# Patient Record
Sex: Male | Born: 1955 | Race: White | Hispanic: No | Marital: Married | Smoking: Former smoker
Health system: Southern US, Community
[De-identification: ages and names within clinical notes are randomized; demographics above are authoritative.]

## PROBLEM LIST (undated history)

## (undated) DIAGNOSIS — E669 Obesity, unspecified: Secondary | ICD-10-CM

## (undated) DIAGNOSIS — I10 Essential (primary) hypertension: Secondary | ICD-10-CM

## (undated) DIAGNOSIS — E785 Hyperlipidemia, unspecified: Secondary | ICD-10-CM

## (undated) DIAGNOSIS — J40 Bronchitis, not specified as acute or chronic: Secondary | ICD-10-CM

## (undated) DIAGNOSIS — F419 Anxiety disorder, unspecified: Secondary | ICD-10-CM

## (undated) DIAGNOSIS — Z5309 Procedure and treatment not carried out because of other contraindication: Secondary | ICD-10-CM

## (undated) DIAGNOSIS — C8590 Non-Hodgkin lymphoma, unspecified, unspecified site: Secondary | ICD-10-CM

## (undated) DIAGNOSIS — Z9889 Other specified postprocedural states: Secondary | ICD-10-CM

## (undated) DIAGNOSIS — I318 Other specified diseases of pericardium: Secondary | ICD-10-CM

## (undated) DIAGNOSIS — J329 Chronic sinusitis, unspecified: Secondary | ICD-10-CM

## (undated) DIAGNOSIS — I509 Heart failure, unspecified: Secondary | ICD-10-CM

## (undated) HISTORY — PX: TUMOR REMOVAL: SHX12

## (undated) HISTORY — DX: Non-Hodgkin lymphoma, unspecified, unspecified site: C85.90

## (undated) HISTORY — DX: Chronic sinusitis, unspecified: J32.9

## (undated) HISTORY — DX: Other specified postprocedural states: Z98.890

## (undated) HISTORY — DX: Heart failure, unspecified: I50.9

## (undated) HISTORY — DX: Essential (primary) hypertension: I10

## (undated) HISTORY — PX: HEMORRHOID SURGERY: SHX153

## (undated) HISTORY — DX: Bronchitis, not specified as acute or chronic: J40

## (undated) HISTORY — DX: Other specified diseases of pericardium: I31.8

## (undated) HISTORY — DX: Procedure and treatment not carried out because of other contraindication: Z53.09

## (undated) HISTORY — DX: Anxiety disorder, unspecified: F41.9

## (undated) HISTORY — DX: Obesity, unspecified: E66.9

## (undated) HISTORY — DX: Hyperlipidemia, unspecified: E78.5

## (undated) HISTORY — PX: NASAL SINUS SURGERY: SHX719

## (undated) HISTORY — PX: PORTACATH PLACEMENT: SHX2246

---

## 2000-07-24 ENCOUNTER — Emergency Department (HOSPITAL_COMMUNITY): Admission: EM | Admit: 2000-07-24 | Discharge: 2000-07-25 | Payer: Self-pay | Admitting: Emergency Medicine

## 2002-11-21 LAB — HM COLONOSCOPY: HM Colonoscopy: NORMAL

## 2008-07-21 HISTORY — PX: BACK SURGERY: SHX140

## 2008-07-28 ENCOUNTER — Ambulatory Visit (HOSPITAL_COMMUNITY): Admission: RE | Admit: 2008-07-28 | Discharge: 2008-07-28 | Payer: Self-pay | Admitting: Family Medicine

## 2008-07-29 ENCOUNTER — Ambulatory Visit: Payer: Self-pay | Admitting: Oncology

## 2008-07-29 ENCOUNTER — Encounter (INDEPENDENT_AMBULATORY_CARE_PROVIDER_SITE_OTHER): Payer: Self-pay | Admitting: Neurosurgery

## 2008-07-29 ENCOUNTER — Inpatient Hospital Stay (HOSPITAL_COMMUNITY): Admission: AD | Admit: 2008-07-29 | Discharge: 2008-08-02 | Payer: Self-pay | Admitting: Internal Medicine

## 2008-07-29 ENCOUNTER — Encounter: Payer: Self-pay | Admitting: Family Medicine

## 2008-07-30 ENCOUNTER — Encounter (INDEPENDENT_AMBULATORY_CARE_PROVIDER_SITE_OTHER): Payer: Self-pay | Admitting: Neurosurgery

## 2008-08-24 ENCOUNTER — Encounter (HOSPITAL_COMMUNITY): Admission: RE | Admit: 2008-08-24 | Discharge: 2008-09-23 | Payer: Self-pay | Admitting: Oncology

## 2008-08-24 ENCOUNTER — Ambulatory Visit (HOSPITAL_COMMUNITY): Payer: Self-pay | Admitting: Oncology

## 2008-08-29 ENCOUNTER — Ambulatory Visit (HOSPITAL_COMMUNITY): Admission: RE | Admit: 2008-08-29 | Discharge: 2008-08-29 | Payer: Self-pay | Admitting: Oncology

## 2008-08-30 ENCOUNTER — Encounter (HOSPITAL_COMMUNITY): Payer: Self-pay | Admitting: Oncology

## 2008-09-02 ENCOUNTER — Ambulatory Visit (HOSPITAL_COMMUNITY): Admission: RE | Admit: 2008-09-02 | Discharge: 2008-09-02 | Payer: Self-pay | Admitting: General Surgery

## 2008-09-22 ENCOUNTER — Encounter (HOSPITAL_COMMUNITY): Admission: RE | Admit: 2008-09-22 | Discharge: 2008-10-19 | Payer: Self-pay | Admitting: Oncology

## 2008-10-10 ENCOUNTER — Ambulatory Visit (HOSPITAL_COMMUNITY): Admission: RE | Admit: 2008-10-10 | Discharge: 2008-10-10 | Payer: Self-pay | Admitting: Oncology

## 2008-10-11 ENCOUNTER — Ambulatory Visit (HOSPITAL_COMMUNITY): Payer: Self-pay | Admitting: Oncology

## 2008-10-13 ENCOUNTER — Emergency Department (HOSPITAL_COMMUNITY): Admission: EM | Admit: 2008-10-13 | Discharge: 2008-10-13 | Payer: Self-pay | Admitting: Emergency Medicine

## 2008-10-17 ENCOUNTER — Ambulatory Visit (HOSPITAL_COMMUNITY): Admission: RE | Admit: 2008-10-17 | Discharge: 2008-10-17 | Payer: Self-pay | Admitting: Oncology

## 2008-10-17 ENCOUNTER — Encounter (HOSPITAL_COMMUNITY): Payer: Self-pay | Admitting: Oncology

## 2008-10-24 ENCOUNTER — Encounter (HOSPITAL_COMMUNITY): Admission: RE | Admit: 2008-10-24 | Discharge: 2008-11-23 | Payer: Self-pay | Admitting: Oncology

## 2008-11-07 ENCOUNTER — Ambulatory Visit (HOSPITAL_COMMUNITY): Admission: RE | Admit: 2008-11-07 | Discharge: 2008-11-07 | Payer: Self-pay | Admitting: Oncology

## 2008-11-28 ENCOUNTER — Ambulatory Visit (HOSPITAL_COMMUNITY): Admission: RE | Admit: 2008-11-28 | Discharge: 2008-11-28 | Payer: Self-pay | Admitting: Oncology

## 2008-11-29 ENCOUNTER — Ambulatory Visit (HOSPITAL_COMMUNITY): Payer: Self-pay | Admitting: Oncology

## 2008-11-29 ENCOUNTER — Encounter (HOSPITAL_COMMUNITY): Admission: RE | Admit: 2008-11-29 | Discharge: 2008-12-29 | Payer: Self-pay | Admitting: Oncology

## 2008-12-12 ENCOUNTER — Ambulatory Visit (HOSPITAL_COMMUNITY): Admission: RE | Admit: 2008-12-12 | Discharge: 2008-12-12 | Payer: Self-pay | Admitting: Oncology

## 2008-12-19 ENCOUNTER — Ambulatory Visit (HOSPITAL_COMMUNITY): Admission: RE | Admit: 2008-12-19 | Discharge: 2008-12-19 | Payer: Self-pay | Admitting: Oncology

## 2008-12-27 ENCOUNTER — Ambulatory Visit: Admission: RE | Admit: 2008-12-27 | Discharge: 2009-02-24 | Payer: Self-pay | Admitting: Radiation Oncology

## 2009-02-01 ENCOUNTER — Ambulatory Visit (HOSPITAL_COMMUNITY): Payer: Self-pay | Admitting: Oncology

## 2009-02-01 ENCOUNTER — Encounter (HOSPITAL_COMMUNITY): Admission: RE | Admit: 2009-02-01 | Discharge: 2009-03-03 | Payer: Self-pay | Admitting: Oncology

## 2009-04-27 ENCOUNTER — Ambulatory Visit (HOSPITAL_COMMUNITY): Payer: Self-pay | Admitting: Oncology

## 2009-04-27 ENCOUNTER — Encounter (HOSPITAL_COMMUNITY): Admission: RE | Admit: 2009-04-27 | Discharge: 2009-05-27 | Payer: Self-pay | Admitting: Oncology

## 2009-05-03 ENCOUNTER — Encounter: Admission: RE | Admit: 2009-05-03 | Discharge: 2009-05-03 | Payer: Self-pay | Admitting: Neurosurgery

## 2009-07-20 ENCOUNTER — Encounter (HOSPITAL_COMMUNITY): Admission: RE | Admit: 2009-07-20 | Discharge: 2009-07-20 | Payer: Self-pay | Admitting: Oncology

## 2009-07-20 ENCOUNTER — Ambulatory Visit (HOSPITAL_COMMUNITY): Payer: Self-pay | Admitting: Oncology

## 2009-07-21 HISTORY — PX: OTHER SURGICAL HISTORY: SHX169

## 2009-07-21 HISTORY — PX: LEFT HEART CATH: SHX5946

## 2009-08-08 ENCOUNTER — Inpatient Hospital Stay (HOSPITAL_COMMUNITY): Admission: EM | Admit: 2009-08-08 | Discharge: 2009-08-11 | Payer: Self-pay | Admitting: Emergency Medicine

## 2009-08-08 ENCOUNTER — Ambulatory Visit: Payer: Self-pay | Admitting: Cardiovascular Disease

## 2009-08-09 ENCOUNTER — Encounter: Payer: Self-pay | Admitting: Cardiology

## 2009-08-18 ENCOUNTER — Encounter: Payer: Self-pay | Admitting: Cardiology

## 2009-08-22 ENCOUNTER — Encounter: Payer: Self-pay | Admitting: Cardiology

## 2009-08-22 ENCOUNTER — Ambulatory Visit (HOSPITAL_COMMUNITY): Admission: RE | Admit: 2009-08-22 | Discharge: 2009-08-22 | Payer: Self-pay | Admitting: Family Medicine

## 2009-08-24 ENCOUNTER — Ambulatory Visit (HOSPITAL_COMMUNITY): Admission: RE | Admit: 2009-08-24 | Discharge: 2009-08-24 | Payer: Self-pay | Admitting: Oncology

## 2009-08-28 ENCOUNTER — Ambulatory Visit: Payer: Self-pay | Admitting: Cardiology

## 2009-08-28 ENCOUNTER — Ambulatory Visit: Payer: Self-pay | Admitting: Internal Medicine

## 2009-08-28 ENCOUNTER — Ambulatory Visit (HOSPITAL_COMMUNITY): Admission: RE | Admit: 2009-08-28 | Discharge: 2009-08-28 | Payer: Self-pay | Admitting: Internal Medicine

## 2009-08-28 ENCOUNTER — Encounter: Payer: Self-pay | Admitting: Internal Medicine

## 2009-08-28 DIAGNOSIS — I319 Disease of pericardium, unspecified: Secondary | ICD-10-CM | POA: Insufficient documentation

## 2009-08-28 DIAGNOSIS — I509 Heart failure, unspecified: Secondary | ICD-10-CM | POA: Insufficient documentation

## 2009-08-28 DIAGNOSIS — C8330 Diffuse large B-cell lymphoma, unspecified site: Secondary | ICD-10-CM

## 2009-08-29 ENCOUNTER — Telehealth: Payer: Self-pay | Admitting: Cardiology

## 2009-08-29 ENCOUNTER — Encounter: Payer: Self-pay | Admitting: Internal Medicine

## 2009-08-29 ENCOUNTER — Encounter (HOSPITAL_COMMUNITY): Admission: RE | Admit: 2009-08-29 | Discharge: 2009-09-28 | Payer: Self-pay | Admitting: Oncology

## 2009-08-29 ENCOUNTER — Encounter: Payer: Self-pay | Admitting: Cardiology

## 2009-09-01 ENCOUNTER — Ambulatory Visit: Payer: Self-pay | Admitting: Cardiology

## 2009-09-01 ENCOUNTER — Inpatient Hospital Stay (HOSPITAL_BASED_OUTPATIENT_CLINIC_OR_DEPARTMENT_OTHER): Admission: RE | Admit: 2009-09-01 | Discharge: 2009-09-01 | Payer: Self-pay | Admitting: Cardiology

## 2009-09-01 ENCOUNTER — Ambulatory Visit: Payer: Self-pay | Admitting: Thoracic Surgery (Cardiothoracic Vascular Surgery)

## 2009-09-06 ENCOUNTER — Encounter: Payer: Self-pay | Admitting: Thoracic Surgery (Cardiothoracic Vascular Surgery)

## 2009-09-06 ENCOUNTER — Inpatient Hospital Stay (HOSPITAL_COMMUNITY)
Admission: RE | Admit: 2009-09-06 | Discharge: 2009-09-09 | Payer: Self-pay | Admitting: Thoracic Surgery (Cardiothoracic Vascular Surgery)

## 2009-09-06 ENCOUNTER — Ambulatory Visit: Payer: Self-pay | Admitting: Cardiology

## 2009-09-13 ENCOUNTER — Ambulatory Visit: Payer: Self-pay | Admitting: Cardiology

## 2009-09-13 DIAGNOSIS — I311 Chronic constrictive pericarditis: Secondary | ICD-10-CM | POA: Insufficient documentation

## 2009-09-18 ENCOUNTER — Ambulatory Visit: Payer: Self-pay | Admitting: Thoracic Surgery (Cardiothoracic Vascular Surgery)

## 2009-09-26 ENCOUNTER — Ambulatory Visit: Payer: Self-pay | Admitting: Cardiology

## 2009-09-26 ENCOUNTER — Ambulatory Visit (HOSPITAL_COMMUNITY): Admission: RE | Admit: 2009-09-26 | Discharge: 2009-09-26 | Payer: Self-pay | Admitting: Cardiology

## 2009-09-26 ENCOUNTER — Ambulatory Visit: Payer: Self-pay

## 2009-09-26 ENCOUNTER — Ambulatory Visit: Payer: Self-pay | Admitting: Internal Medicine

## 2009-09-26 ENCOUNTER — Encounter: Payer: Self-pay | Admitting: Cardiology

## 2009-09-27 LAB — CONVERTED CEMR LAB
CO2: 31 meq/L (ref 19–32)
Chloride: 99 meq/L (ref 96–112)
Creatinine, Ser: 0.8 mg/dL (ref 0.4–1.5)
Potassium: 4 meq/L (ref 3.5–5.1)
Sodium: 139 meq/L (ref 135–145)

## 2009-09-28 ENCOUNTER — Encounter: Payer: Self-pay | Admitting: Cardiology

## 2009-09-28 ENCOUNTER — Ambulatory Visit: Payer: Self-pay | Admitting: Thoracic Surgery (Cardiothoracic Vascular Surgery)

## 2009-09-28 ENCOUNTER — Encounter
Admission: RE | Admit: 2009-09-28 | Discharge: 2009-09-28 | Payer: Self-pay | Admitting: Thoracic Surgery (Cardiothoracic Vascular Surgery)

## 2009-10-04 ENCOUNTER — Encounter: Payer: Self-pay | Admitting: Cardiology

## 2009-10-06 ENCOUNTER — Ambulatory Visit: Payer: Self-pay | Admitting: Cardiology

## 2009-10-10 ENCOUNTER — Ambulatory Visit (HOSPITAL_COMMUNITY): Payer: Self-pay | Admitting: Oncology

## 2009-10-16 ENCOUNTER — Encounter (HOSPITAL_COMMUNITY): Admission: RE | Admit: 2009-10-16 | Discharge: 2009-10-20 | Payer: Self-pay | Admitting: Cardiology

## 2009-10-23 ENCOUNTER — Encounter (HOSPITAL_COMMUNITY): Admission: RE | Admit: 2009-10-23 | Discharge: 2009-11-22 | Payer: Self-pay | Admitting: Cardiology

## 2009-11-13 ENCOUNTER — Encounter: Payer: Self-pay | Admitting: Cardiology

## 2009-11-23 ENCOUNTER — Encounter (HOSPITAL_COMMUNITY): Admission: RE | Admit: 2009-11-23 | Discharge: 2009-12-11 | Payer: Self-pay | Admitting: Cardiology

## 2009-11-28 ENCOUNTER — Encounter (HOSPITAL_COMMUNITY): Admission: RE | Admit: 2009-11-28 | Discharge: 2009-12-28 | Payer: Self-pay | Admitting: Oncology

## 2009-11-28 ENCOUNTER — Ambulatory Visit (HOSPITAL_COMMUNITY): Payer: Self-pay | Admitting: Oncology

## 2009-11-28 ENCOUNTER — Encounter: Payer: Self-pay | Admitting: Cardiology

## 2009-12-04 ENCOUNTER — Ambulatory Visit: Payer: Self-pay | Admitting: Thoracic Surgery (Cardiothoracic Vascular Surgery)

## 2009-12-21 ENCOUNTER — Ambulatory Visit: Payer: Self-pay | Admitting: Cardiology

## 2009-12-21 DIAGNOSIS — E785 Hyperlipidemia, unspecified: Secondary | ICD-10-CM | POA: Insufficient documentation

## 2010-01-05 ENCOUNTER — Encounter: Payer: Self-pay | Admitting: Cardiology

## 2010-01-09 ENCOUNTER — Encounter: Payer: Self-pay | Admitting: Cardiology

## 2010-01-11 ENCOUNTER — Telehealth: Payer: Self-pay | Admitting: Cardiology

## 2010-02-20 ENCOUNTER — Ambulatory Visit (HOSPITAL_COMMUNITY): Payer: Self-pay | Admitting: Oncology

## 2010-02-20 ENCOUNTER — Encounter (HOSPITAL_COMMUNITY): Admission: RE | Admit: 2010-02-20 | Discharge: 2010-03-22 | Payer: Self-pay | Admitting: Oncology

## 2010-03-07 ENCOUNTER — Ambulatory Visit (HOSPITAL_COMMUNITY): Admission: RE | Admit: 2010-03-07 | Discharge: 2010-03-07 | Payer: Self-pay | Admitting: Oncology

## 2010-03-16 ENCOUNTER — Ambulatory Visit (HOSPITAL_COMMUNITY): Admission: RE | Admit: 2010-03-16 | Discharge: 2010-03-16 | Payer: Self-pay | Admitting: Oncology

## 2010-03-27 ENCOUNTER — Telehealth: Payer: Self-pay | Admitting: Cardiology

## 2010-04-13 ENCOUNTER — Ambulatory Visit: Payer: Self-pay

## 2010-04-13 ENCOUNTER — Ambulatory Visit: Payer: Self-pay | Admitting: Cardiology

## 2010-04-13 ENCOUNTER — Encounter: Payer: Self-pay | Admitting: Cardiology

## 2010-04-13 ENCOUNTER — Ambulatory Visit (HOSPITAL_COMMUNITY): Admission: RE | Admit: 2010-04-13 | Discharge: 2010-04-13 | Payer: Self-pay | Admitting: Cardiology

## 2010-05-15 ENCOUNTER — Ambulatory Visit (HOSPITAL_COMMUNITY): Payer: Self-pay | Admitting: Oncology

## 2010-06-05 IMAGING — CT CT CHEST W/ CM
1 series · 15 of 31 positions shown, 19 images · IV contrast (Omnipaque 300)
Comparison: CT abdomen 07/28/2008 and MRI thoracic spine
07/29/2008.

CLINICAL DATA: T5 and T6 destructive mass.

CT CHEST WITH CONTRAST
TECHNIQUE: Multidetector CT imaging of the chest was performed
following the standard protocol during bolus administration of
intravenous contrast.
Contrast: 80 ml [id number].

[Series 2: chestroutine 5.0 b40f · axial · 0.79mm/px · z∈[-324,-60]mm · 15 of 59 slices shown, 19 images]
[im 3/59  mediastinal]
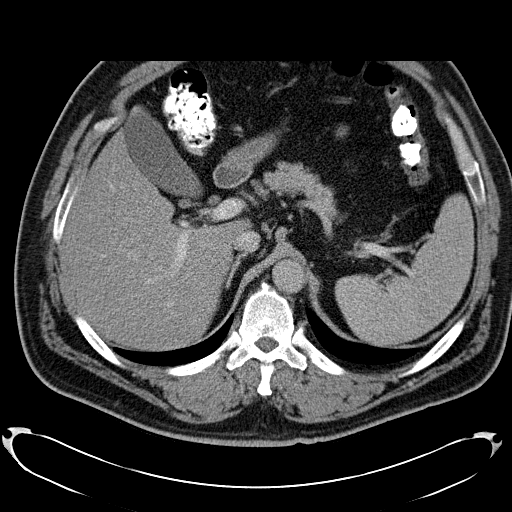
[im 3/59  lung]
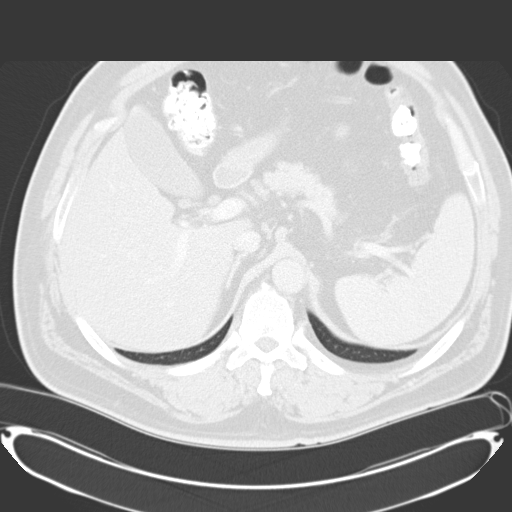
[im 7/59  lung]
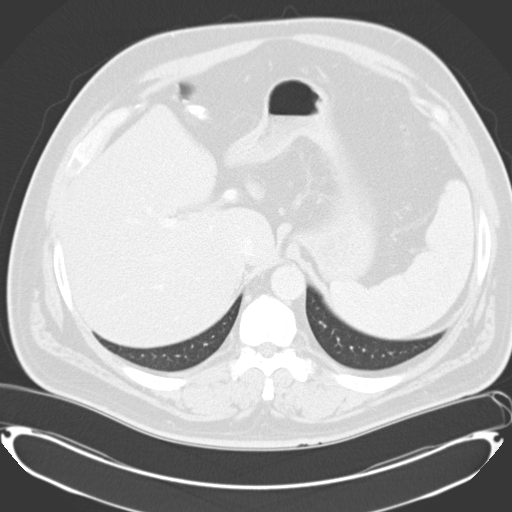
[im 11/59  lung]
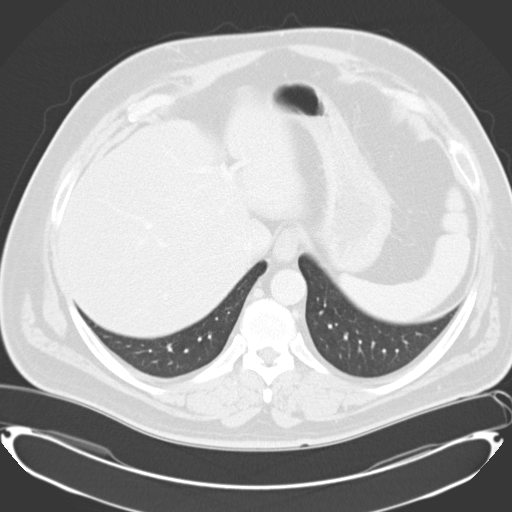
[im 13/59  lung]
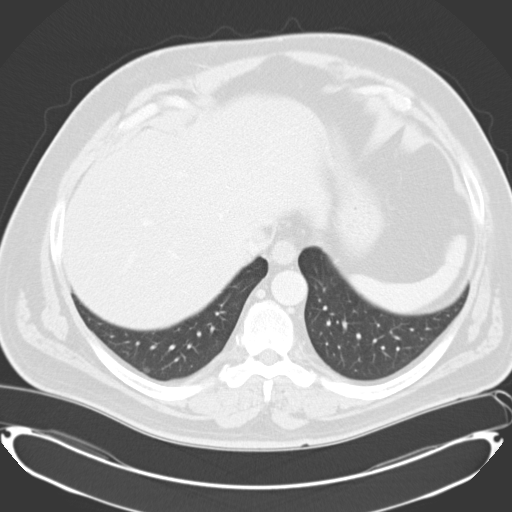
[im 18/59  mediastinal]
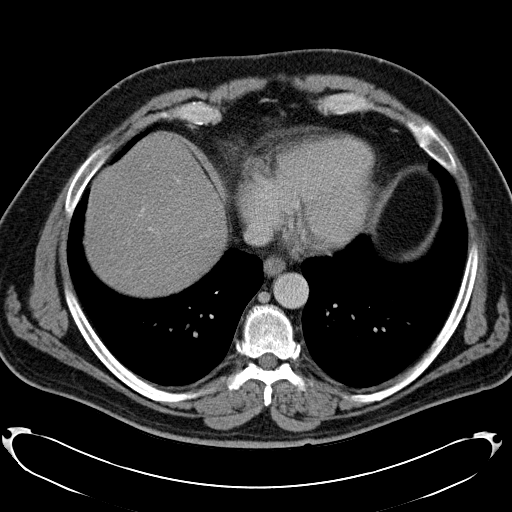
[im 18/59  lung]
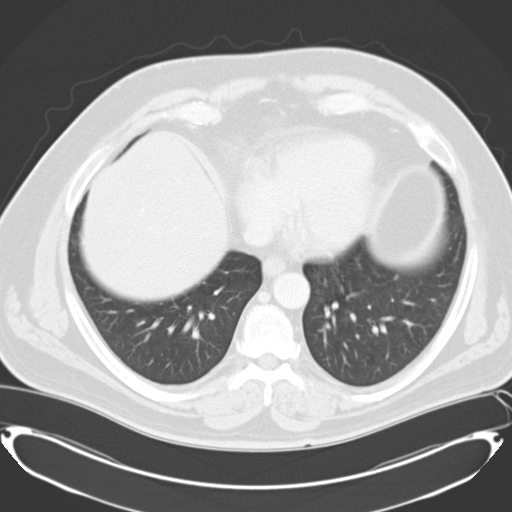
[im 22/59  lung]
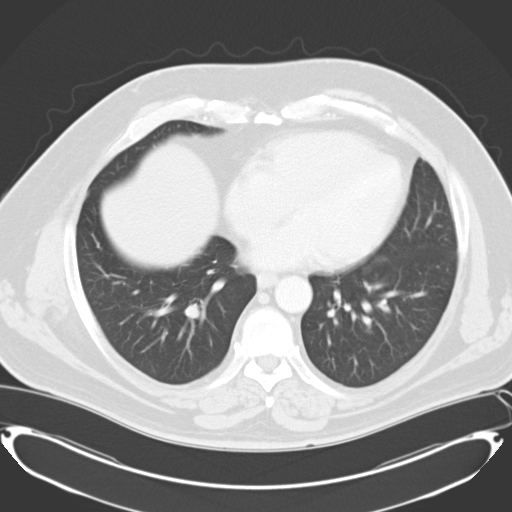
[im 26/59  lung]
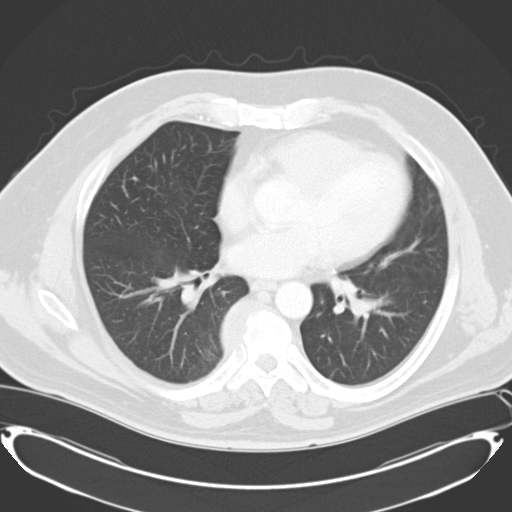
[im 31/59  lung]
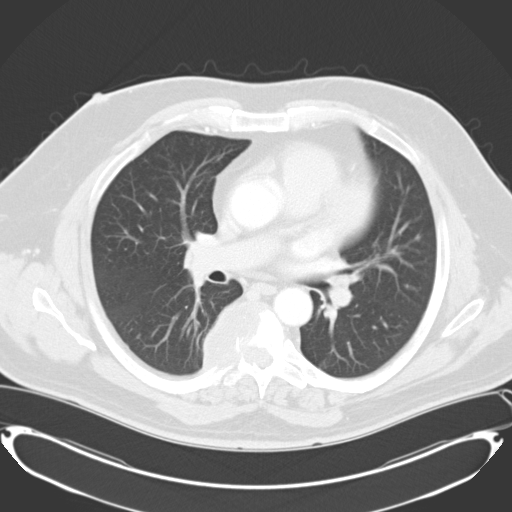
[im 33/59  mediastinal]
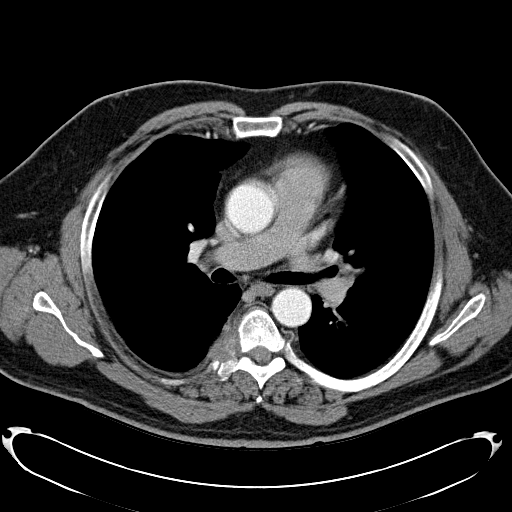
[im 33/59  lung]
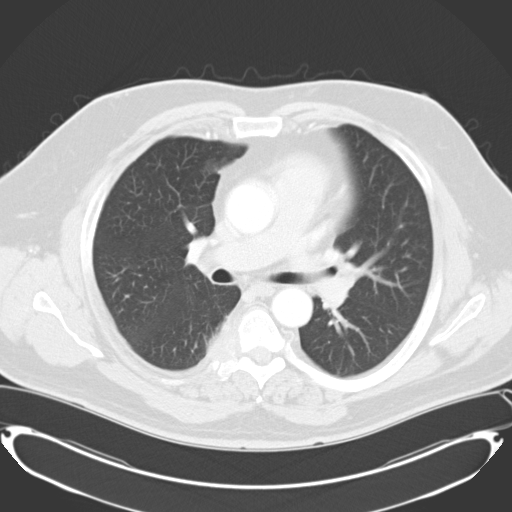
[im 37/59  lung]
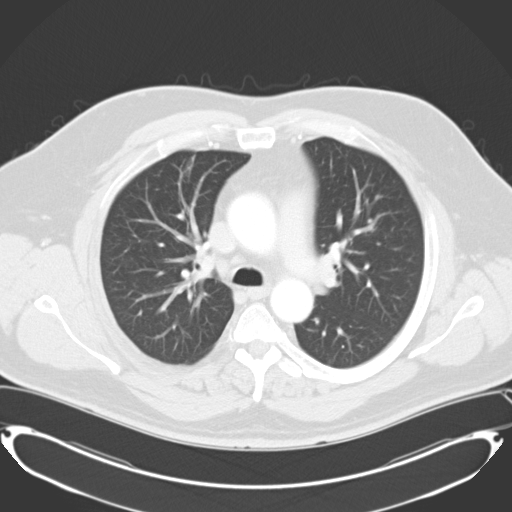
[im 41/59  lung]
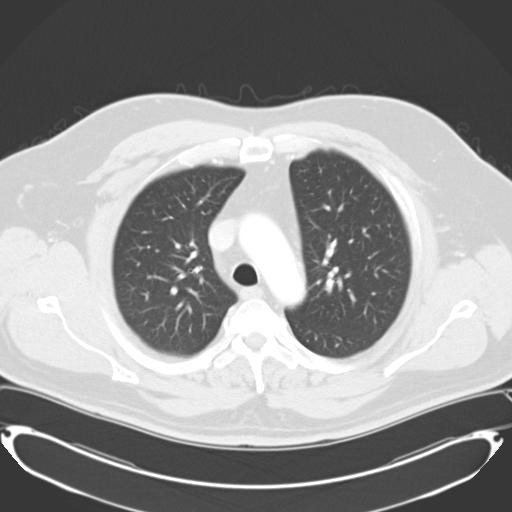
[im 46/59  lung]
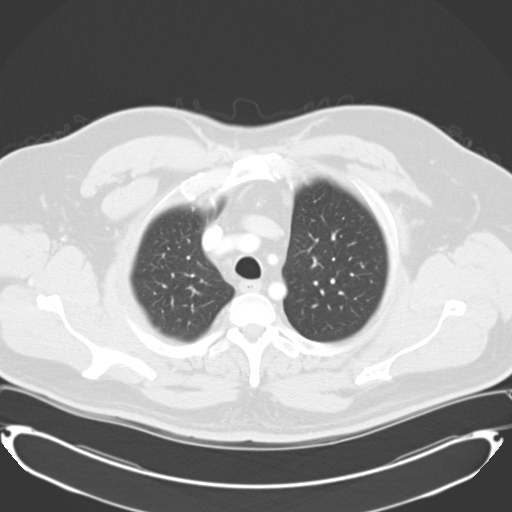
[im 48/59  mediastinal]
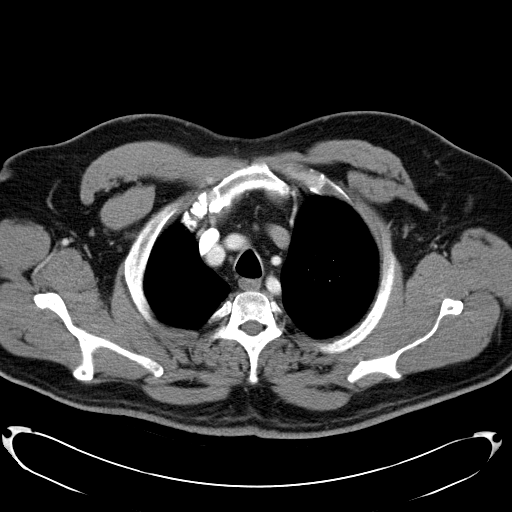
[im 48/59  lung]
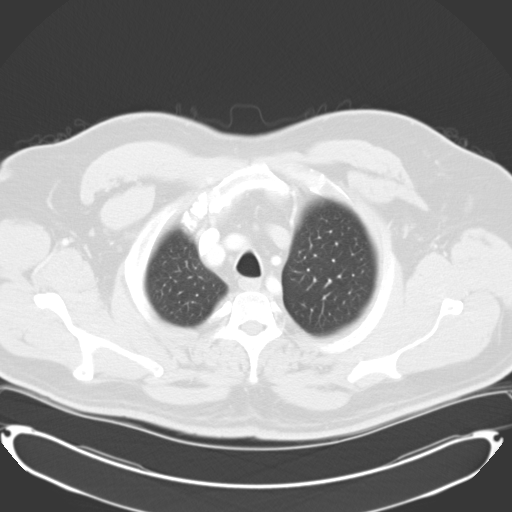
[im 52/59  lung]
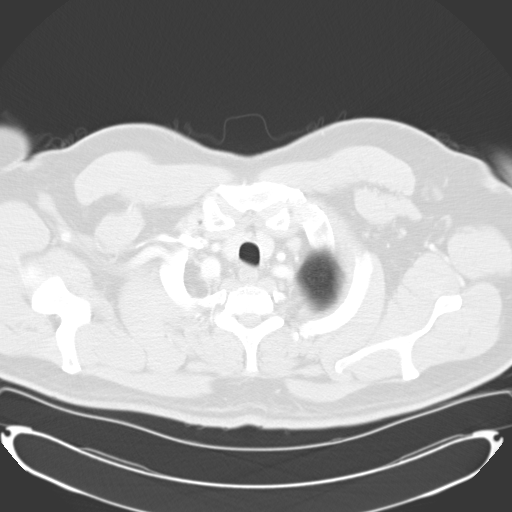
[im 56/59  lung]
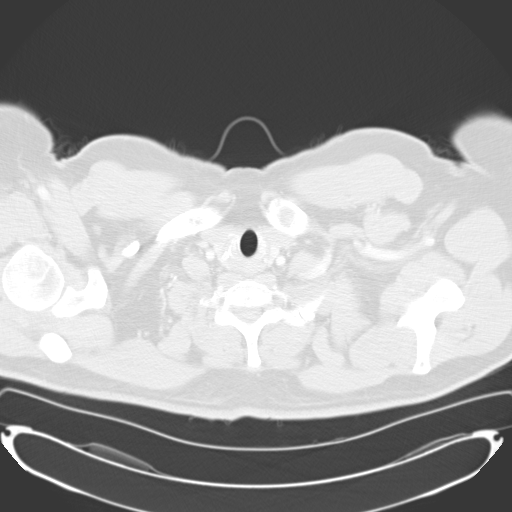

[15 of 31 positions shown; findings below may reference images not displayed]

FINDINGS: The chest wall is unremarkable.  No supraclavicular or
axillary adenopathy.  There is a destructive bony process involving
the T6 and T7 vertebral bodies on the right side with large
paraspinal mass also involving the right to adjacent posterior
ribs.  Invasion of the spinal canal and spinal canal compromise
better seen on the MRI.  No other definite destructive bony lesions
are seen.

The heart is normal in size.  No pericardial effusion.  No
mediastinal or hilar adenopathy.  The esophagus is grossly normal.
The aorta is normal in caliber.  No dissection.

The thyroid gland appears normal.

Examination of the lung parenchyma demonstrates several small
pulmonary nodules.  There are four right lower lobe pulmonary
nodules and one left lower lobe pulmonary nodule.  These are
indeterminate findings.  This certainly could be noncalcified
granulomas or intrapulmonary lymph nodes.  Metastatic disease
cannot be excluded.  No primary lung cancer is seen.  No pleural
effusions.

The upper abdomen demonstrates no significant findings.  There is a
small left adrenal gland lesion.
IMPRESSION: 1.  Destructive lesion at T6 and T7.  This may reflect multiple
myeloma, metastatic disease or infection.  Biopsy is suggested.
2.  No primary lung tumor is seen.  There are small sub centimeter
lower lobe pulmonary nodules bilaterally.
3.  No mediastinal or hilar adenopathy.

## 2010-06-06 IMAGING — RF DG THORACIC SPINE 2V
1 series · 2 of 2 positions shown · non-contrast
Comparison: None

CLINICAL DATA: Posterior fusion

THORACIC SPINE - 2 VIEW

[Series 1: run · 2 of 2 slices shown]
[im 1/2]
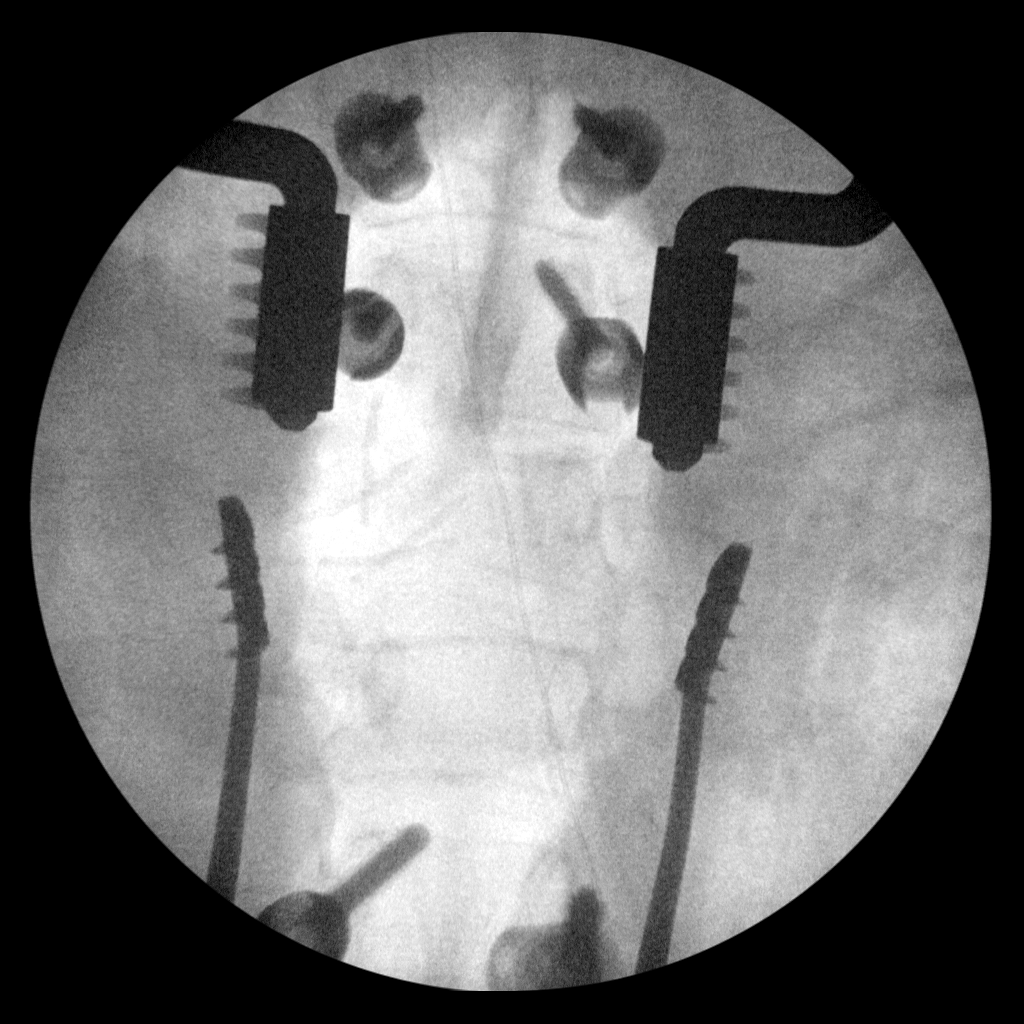
[im 2/2]
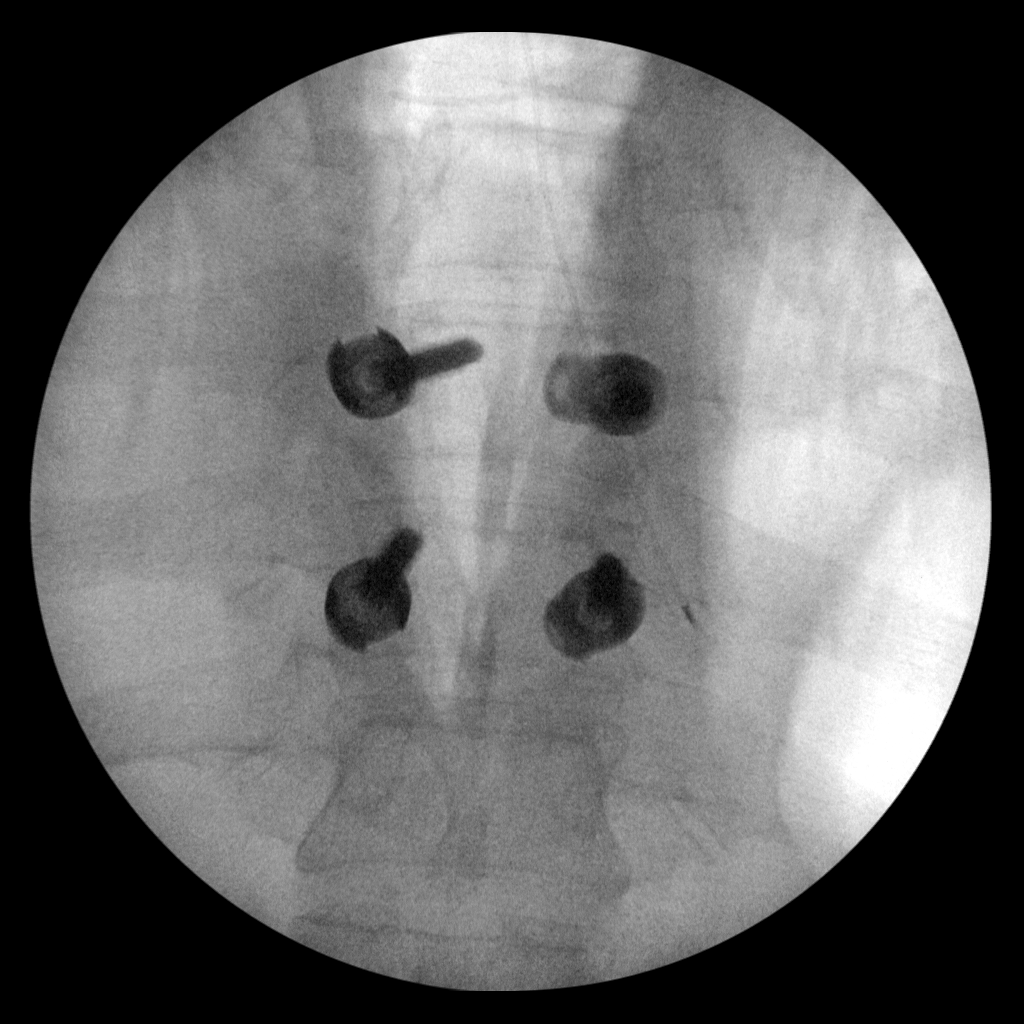

[2 of 2 positions shown; findings below may reference images not displayed]

FINDINGS: Fluoroscopy was provided for assistance with placement of
Transpedicle screws.  I cannot determine from the two views
received which level the screws were placed.
IMPRESSION: Guidance for placement of thoracic Transpedicle screws.

## 2010-06-26 ENCOUNTER — Encounter (HOSPITAL_COMMUNITY)
Admission: RE | Admit: 2010-06-26 | Discharge: 2010-06-26 | Payer: Self-pay | Source: Home / Self Care | Admitting: Oncology

## 2010-07-31 ENCOUNTER — Ambulatory Visit (HOSPITAL_COMMUNITY): Payer: Self-pay | Admitting: Oncology

## 2010-07-31 ENCOUNTER — Encounter: Payer: Self-pay | Admitting: Cardiology

## 2010-09-11 ENCOUNTER — Encounter (HOSPITAL_COMMUNITY)
Admission: RE | Admit: 2010-09-11 | Discharge: 2010-10-11 | Payer: Self-pay | Source: Home / Self Care | Attending: Oncology | Admitting: Oncology

## 2010-10-23 ENCOUNTER — Encounter (HOSPITAL_COMMUNITY)
Admission: RE | Admit: 2010-10-23 | Discharge: 2010-11-20 | Payer: Self-pay | Source: Home / Self Care | Attending: Oncology | Admitting: Oncology

## 2010-10-23 ENCOUNTER — Ambulatory Visit (HOSPITAL_COMMUNITY)
Admission: RE | Admit: 2010-10-23 | Discharge: 2010-11-20 | Payer: Self-pay | Source: Home / Self Care | Attending: Oncology | Admitting: Oncology

## 2010-11-11 ENCOUNTER — Encounter: Payer: Self-pay | Admitting: Family Medicine

## 2010-11-11 ENCOUNTER — Encounter (HOSPITAL_COMMUNITY): Payer: Self-pay | Admitting: Oncology

## 2010-11-18 LAB — CONVERTED CEMR LAB
BUN: 15 mg/dL (ref 6–23)
CO2: 32 meq/L (ref 19–32)
Chloride: 104 meq/L (ref 96–112)
Glucose, Bld: 103 mg/dL — ABNORMAL HIGH (ref 70–99)
Potassium: 4.1 meq/L (ref 3.5–5.1)

## 2010-11-20 NOTE — Miscellaneous (Signed)
Summary: [facility] Report  [facility] Report   Imported By: Roderic Ovens 12/01/2009 13:52:03  _____________________________________________________________________  External Attachment:    Type:   Image     Comment:   External Document

## 2010-11-20 NOTE — Progress Notes (Signed)
Summary: question regarding pcp  Phone Note Call from Patient Call back at Home Phone [phone]   Caller: Patient Reason for Call: Talk to Nurse, Talk to Doctor Summary of Call: pt pcp has a question regarding medication Initial call taken by: Omer Jack,  January 11, 2010 12:41 PM  Follow-up for Phone Call        pt has been on Zofran 4mg  daily since hosp d/c adn pcp questioned why, pt states he is not sure why med was started, his chemo was finished in May 2010, advised not sure why pt was on med, will send to Dr Shirlee Latch for review Meredith Staggers, RN  January 11, 2010 1:28 PM      Appended Document: question regarding pcp I am not sure why he is taking Zofran.  We did not put him on it.  I see no reason why he needs to be on it.   Appended Document: question regarding pcp discussed with pt by telephone  Appended Document: question regarding pcp    Clinical Lists Changes  Medications: Added new medication of FISH OIL 1000 MG CAPS (OMEGA-3 FATTY ACIDS) two daily Removed medication of ZOFRAN 4 MG TABS (ONDANSETRON HCL) Take one tablet by mouth once daily.

## 2010-11-20 NOTE — Assessment & Plan Note (Signed)
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  Patient had a repeat echo today which showed EF about 50% and septal respirophasic shift that was still present but less prominent than on the prior echo.  The RV now looks smaller and the IVC was not dilated.    Patient still gets short of breath walking up an incline, but he is doing well on flat ground or downhill.  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  He does ride a 4-wheeler more now than before his effusive-constrictive pericarditis.   No chest pain.  No orthopnea/PND.  No leg swelling.  He is taking Lasix regularly.    Labs (2/11): creatinine 0.71  ECG: NSR, anterior T wave inversions  Current Medications (verified): 1)  Carvedilol 3.125 Mg Tabs (Carvedilol) .... Take One Tablet By Mouth Twice A Day 2)  Aspirin 81 Mg Tbec (Aspirin) .... Take One Tablet By Mouth Daily 3)  Crestor 5 Mg Tabs (Rosuvastatin Calcium) .... Take 1 By Mouth Every Day 4)  Clonazepam 0.5 Mg Tabs (Clonazepam) .... Take 1-2 Tabs By Mouth Daily Prn 5)  Cymbalta 60 Mg Cpep (Duloxetine Hcl) .... Take 1 By Mouth Once Daily 6)  Multivitamins  Tabs (Multiple Vitamin) .... Take 1 By Mouth Once Daily 7)  Trazodone Hcl 100 Mg Tabs (Trazodone Hcl) .... Take 1 By Mouth At Bedtime 8)  Cozaar 25 Mg Tabs (Losartan Potassium) .... One Tablet Daily 9)  Fish Oil 1000 Mg Caps (Omega-3 Fatty Acids) .... Two Daily 10)  Vitamin D3 2000 Unit Caps (Cholecalciferol) .Marland Kitchen.. 1 Tab By Mouth Once Daily 11)  Potassium Chloride Crys Cr 20 Meq Cr-Tabs (Potassium Chloride Crys Cr) .Marland Kitchen.. 1 Tab By Mouth Once Daily 12)  Furosemide 40 Mg Tabs (Furosemide) .... Take One Tablet By Mouth  Daily.  Allergies: 1)  ! Penicillin 2)  ! * Simvastatin 3)  ! * Pravastatin  Past History:  Past Medical History: 1.  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  Chemotherapy, including Adriamycin, completed 3/10.  Radiation to chest/back in early 2010.  PET scan 11/10 with no evidence for recurrent lymphoma.  2.  Chest pain: Left heart cath (10/10) with no angiographic coronary disease.  3.  Cardiomyopathy: Nonischemic.  EF 35-40% with global hypokinesis by LV-gram 10/10.  Cardiac MRI (10/10) was limited by respiratory artifact but showed a small to moderate free-flowing pericardial effusion with no tamponade, EF 43% with global hypokinesis, no myocardial delayed enhancement (no definite evidence of myocardial infarction, infiltrative disease, or myocarditis). Echo (11/10) with EF 40-45%.  Cardiomyopathy may be secondary to Adriamycin toxicity.  Echo after pericardiectomy in (12/10) showed EF 55%. 4.  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  Had free-flowing effusion in 10/10 with no tamponade.  Patient was reimaged by echo in 11/10 showed EF 40-45% (global HK), dilated IVC, and evidence for ventricular interdependence with an organized pericardial effusion.  Hemodynamic right and left heart cath showed mean RA 14, RV 27/18, PA 29/17, mean PCWP 14, LVEDP 18, cardiac index 1.8 => there was equalization of diastolic pressure.  Also, ventricular interdependence was demonstrated by cath.  Patient underwent pericardiectomy in 11/10.  Echo after pericardiectomy 10/29/23) showed EF 55%, moderate diastolic dysfunction, RV moderately dilated and moderately dysfunctional.  There were still some signs of constrictive physiology (respirophasic interventricular septal variation) but the IVC was small with inspiratory collapse.   Repeat echo 6/11 showed EF 50%, normal RV size and systolic function (improved from 2023-10-29), some septal  shift with inspiration but less pronounced than in 2023-10-29, and normal IVC size.  5. Hyperlipidemia: myalgias with pravastatin and simvastatin. 6. Anxiety 7. ACEI cough.   Family History: Reviewed history from 09/13/2009 and no changes required. No premature CAD Positive for HTN, DM.  Sister with Turner's Syndrome  Social History: Reviewed history from 12/21/2009 and no changes required. married, lives outside of [place].   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   Review of Systems       All systems reviewed and negative except as per HPI.   Vital Signs:  Patient profile:   55 year old male Height:      70 inches Weight:      276 pounds BMI:     39.75 Pulse rate:   70 / minute Resp:     14 per minute BP sitting:   118 / 70  (left arm)  Vitals Entered By: Kem Parkinson (April 13, 2010 8:37 AM)  Physical Exam  General:  Well developed, well nourished, in no acute distress. obese.  Mouth:  post nasal drip.   Neck:  Neck supple, no JVD. No masses, thyromegaly or abnormal cervical nodes. Lungs:  Clear bilaterally to auscultation and percussion. Heart:  Non-displaced PMI, chest non-tender; regular rate and rhythm, S1, S2 without murmurs, rubs or gallops. Carotid upstroke normal, no bruit. Pedals normal pulses. No edema, no varicosities. Abdomen:  Bowel sounds positive; abdomen soft and non-tender without masses, organomegaly, or hernias noted. No hepatosplenomegaly. Extremities:  No clubbing or cyanosis. Neurologic:  Alert and oriented x 3. Psych:  Normal affect.   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  The probable etiology of this was thoracic radiation.  He is now status post pericardiectomy.  His symptoms have improved.  He still has NYHA II-IIIa dyspnea (IIIb prior).   Still short of breath with inclines.  Followup echo today shows EF 50% with improved RV function compared to 10-29-23.  While there is still respirophasic septal shift, it is less significant than on the prior echo and the IVC is not dilated.  - Continue current doses of Coreg, Lasix, and losartan.  - Increase exercise level.  He needs to lose weight.  - BMET/BNP today.   Problem # 2:  HYPERLIPIDEMIA-MIXED (ICD-272.4) Assessment: Improved Tolerating Crestor.  Lipids followed at Dr. Kathi Der office.  We will ask for a copy.   Other Orders: TLB-BMP (Basic Metabolic Panel-BMET) (80048-METABOL) TLB-BNP (B-Natriuretic Peptide) (83880-BNPR)  Patient Instructions: 1)  Your physician recommends that you schedule a follow-up appointment in: 6 months 2)  Your physician recommends that you return for lab work in: BMET/BNP today

## 2010-11-20 NOTE — Letter (Signed)
Summary: [facility] Office Progress Note   [facility] Office Progress Note   Imported By: Roderic Ovens 09/11/2010 14:38:13  _____________________________________________________________________  External Attachment:    Type:   Image     Comment:   External Document

## 2010-11-20 NOTE — Progress Notes (Signed)
Summary: pt needs refill /   Phone Note Refill Request Message from:  Pharmacy on [phone]  Refills Requested: Medication #1:  lasix 20mg  ref# [id number]  Initial call taken by: Omer Jack,  March 27, 2010 12:48 PM  Follow-up for Phone Call        Lasix was D/C'd per 12/21/2009 office visit.   Follow-up by: Judithe Modest CMA,  March 27, 2010 1:43 PM

## 2010-11-20 NOTE — Medication Information (Signed)
Summary: Lab Orders  Lab Orders   Imported By: Marylou Mccoy 01/05/2010 15:13:08  _____________________________________________________________________  External Attachment:    Type:   Image     Comment:   External Document

## 2010-11-20 NOTE — Assessment & Plan Note (Signed)
Summary: per check out/sf   Primary Provider:  Vernon Prey, MD  CC:  shortness of breath.  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  Repeat echo was done post-pericardiectomy.  This showed improved LV systolic function (now within normal range) but worsened RV function (probably made evident by removal of the restraining pericardium).  There was still some suggestion of constrictive physiology (respirophasic variation of the septum) but the IVC was small and collapses with respiration.    Patient still gets short of breath walking up an incline, but he is doing well on flat ground or downhill.  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  He does ride a 4-wheeler more now than before his effusive-constrictive pericarditis.  His weight is higher today but he has been eating more.  No chest pain.  No orthopnea/PND.  No leg swelling.   Patient developed muscle pain with daily Crestor and is now taking it every other day with no problem.    Labs (2/11): creatinine 0.71  Current Medications (verified): 1)  Carvedilol 3.125 Mg Tabs (Carvedilol) .... Take One Tablet By Mouth Twice A Day 2)  Aspirin 81 Mg Tbec (Aspirin) .... Take One Tablet By Mouth Daily 3)  Crestor 5 Mg Tabs (Rosuvastatin Calcium) .... Take 1 By Mouth Every Day 4)  Clonazepam 0.5 Mg Tabs (Clonazepam) .... Take 1-2 Tabs By Mouth Daily Prn 5)  Cymbalta 60 Mg Cpep (Duloxetine Hcl) .... Take 1 By Mouth Once Daily 6)  Multivitamins  Tabs (Multiple Vitamin) .... Take 1 By Mouth Once Daily 7)  Trazodone Hcl 100 Mg Tabs (Trazodone Hcl) .... Take 1 By Mouth At Bedtime 8)  Acetaminophen 325 Mg Tabs (Acetaminophen) .... Take Every 4 Hours or As Needed 9)  Cozaar 25 Mg Tabs (Losartan Potassium) .... One  Tablet Daily 10)  Zofran 4 Mg Tabs (Ondansetron Hcl) .... Take One Tablet By Mouth Once Daily.  Allergies: 1)  ! Penicillin 2)  ! * Simvastatin 3)  ! * Pravastatin  Past History:  Past Medical History: Last updated: 10/06/2009 1.  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  Chemotherapy, including Adriamycin, completed 3/10.  Radiation to chest/back in early 2010.  PET scan 11/10 with no evidence for recurrent lymphoma.  2.  Chest pain: Left heart cath (10/10) with no angiographic coronary disease.  3.  Cardiomyopathy: Nonischemic.  EF 35-40% with global hypokinesis by LV-gram 10/10.  Cardiac MRI (10/10) was limited by respiratory artifact but showed a small to moderate free-flowing pericardial effusion with no tamponade, EF 43% with global hypokinesis, no myocardial delayed enhancement (no definite evidence of myocardial infarction, infiltrative disease, or myocarditis). Echo (11/10) with EF 40-45%.  Cardiomyopathy may be secondary to Adriamycin toxicity.  Echo after pericardiectomy in (12/10) showed EF 55%. 4.  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  Had free-flowing effusion in 10/10 with no tamponade.  Patient was reimaged by echo in 11/10 showed EF 40-45% (global HK), dilated IVC, and evidence for ventricular interdependence with an organized pericardial effusion.  Hemodynamic right and left heart cath showed mean RA 14, RV 27/18, PA 29/17, mean PCWP 14, LVEDP 18, cardiac index 1.8 => there was equalization of diastolic pressure.  Also, ventricular interdependence was demonstrated by cath.  Patient  underwent pericardiectomy in 11/10.  Echo after pericardiectomy (12/10) showed EF 55%, moderate diastolic dysfunction, RV moderately dilated and moderately dysfunctional.  There were still some signs of constrictive physiology (respirophasic interventricular septal variation) but the IVC was small with inspiratory  collapse.   5. Hyperlipidemia: myalgias with pravastatin and simvastatin. 6. Anxiety 7. ACEI cough.   Family History: Reviewed history from 09/13/2009 and no changes required. No premature CAD Positive for HTN, DM.  Sister with Turner's Syndrome  Social History: married, lives outside of [place].   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   Review of Systems       All systems reviewed and negative except as per HPI.   Vital Signs:  Patient profile:   55 year old male Height:      70 inches Weight:      276 pounds BMI:     39.75 Pulse rate:   104 / minute Resp:     18 per minute BP sitting:   111 / 74  (right arm)  Vitals Entered By: Ledon Snare, RN (December 21, 2009 8:14 AM)  Physical Exam  General:  Well developed, well nourished, in no acute distress. Mildly obese.  Neck:  Neck supple, no JVD. No masses, thyromegaly or abnormal cervical nodes. Lungs:  Clear bilaterally to auscultation and percussion. Heart:  Non-displaced PMI, chest non-tender; regular rate and rhythm, S1, S2 without murmurs, rubs or gallops. Carotid upstroke normal, no bruit.  Pedals normal pulses. No edema, no varicosities. Abdomen:  Bowel sounds positive; abdomen soft and non-tender without masses, organomegaly, or hernias noted. No hepatosplenomegaly. Extremities:  No clubbing or cyanosis. Neurologic:  Alert and oriented x 3. Psych:  Normal affect.   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  The probable etiology of this was thoracic radiation.  He is now status post pericardiectomy.  His symptoms have improved.  He still has NYHA II-IIIa dyspnea (IIIb prior).  Still short of breath with inclines.  Followup echo showed improved LV systolic function (EF 55%) but worsened RV function.  This may have  been brought out by release of pericardial restraint on the RV.  There was some sign of residual ventricular interdependence (respiratory variation in the interventricular septum) but the IVC was small and collapsed with respiration, which is improved compared to pre-op.   - Appears euvolemic.  Will have patient try stopping Lasix and KCl.  Will follow closely and if there is weight gain or increased dyspnea, restart.  Patient will need to watch his sodium intake closely.  - Continue current doses of Coreg and losartan.  - Increase exercise level.  - Repeat echo in June 2011.   Problem # 2:  CONGESTIVE HEART FAILURE, UNSPECIFIED (ICD-428.0) EF normalized on most recent echo.  Still with NYHA II-III symptoms that may be due to some residual constrictive physiology.  Continue current doses of Coreg and losartan and try to exercise more.   Problem # 3:  HYPERLIPIDEMIA-MIXED (ICD-272.4) He is able to tolerate Crestor every other day.  Will have him check lipids/LFTs.   Other Orders: Echocardiogram (Echo)  Patient Instructions: 1)  Your physician recommends that you schedule a follow-up appointment in: same day as ECHO 2)  Your physician has recommended you make the following change in your medication: please discontinue lasix  and potassium and call anne [phone] and  let her know how you are doing off meds 3)  Your physician has requested that you have an echocardiogram.  Echocardiography is a painless test that uses sound waves to create images of your heart. It provides your doctor with information about the size and shape of your heart and how well your heart's chambers and valves are working.  This procedure takes approximately one hour. There are no restrictions for this procedure.

## 2010-11-20 NOTE — Letter (Signed)
Summary: [facility]  [facility]iac Rehab   Imported By: Marylou Mccoy 04/06/2010 17:51:13  _____________________________________________________________________  External Attachment:    Type:   Image     Comment:   External Document

## 2010-12-03 ENCOUNTER — Encounter (HOSPITAL_COMMUNITY): Payer: 59 | Attending: Oncology

## 2010-12-03 ENCOUNTER — Other Ambulatory Visit (HOSPITAL_COMMUNITY): Payer: Self-pay

## 2010-12-03 ENCOUNTER — Other Ambulatory Visit (HOSPITAL_COMMUNITY): Payer: 59

## 2010-12-03 DIAGNOSIS — C8589 Other specified types of non-Hodgkin lymphoma, extranodal and solid organ sites: Secondary | ICD-10-CM | POA: Insufficient documentation

## 2010-12-03 DIAGNOSIS — Z9221 Personal history of antineoplastic chemotherapy: Secondary | ICD-10-CM | POA: Insufficient documentation

## 2010-12-05 ENCOUNTER — Ambulatory Visit (HOSPITAL_COMMUNITY): Payer: 59 | Admitting: Oncology

## 2010-12-05 DIAGNOSIS — C8589 Other specified types of non-Hodgkin lymphoma, extranodal and solid organ sites: Secondary | ICD-10-CM

## 2010-12-13 ENCOUNTER — Other Ambulatory Visit: Payer: Self-pay | Admitting: Dermatology

## 2011-01-03 LAB — DIFFERENTIAL
Basophils Absolute: 0 10*3/uL (ref 0.0–0.1)
Eosinophils Absolute: 0.2 10*3/uL (ref 0.0–0.7)
Lymphocytes Relative: 17 % (ref 12–46)
Lymphs Abs: 1 10*3/uL (ref 0.7–4.0)
Neutrophils Relative %: 66 % (ref 43–77)

## 2011-01-03 LAB — COMPREHENSIVE METABOLIC PANEL
CO2: 29 mEq/L (ref 19–32)
Calcium: 9.1 mg/dL (ref 8.4–10.5)
Chloride: 101 mEq/L (ref 96–112)
Creatinine, Ser: 0.78 mg/dL (ref 0.4–1.5)
GFR calc non Af Amer: >60 mL/min (ref >60)
Glucose, Bld: 93 mg/dL (ref 70–99)
Total Bilirubin: 0.4 mg/dL (ref 0.3–1.2)

## 2011-01-03 LAB — CBC
HCT: 38.8 % — ABNORMAL LOW (ref 39.0–52.0)
Hemoglobin: 12.9 g/dL — ABNORMAL LOW (ref 13.0–17.0)
MCH: 28.7 pg (ref 26.0–34.0)
MCHC: 33.3 g/dL (ref 30.0–36.0)
MCV: 86.2 fL (ref 78.0–100.0)

## 2011-01-03 LAB — SEDIMENTATION RATE: Sed Rate: 35 mm/hr — ABNORMAL HIGH (ref 0–16)

## 2011-01-03 LAB — LACTATE DEHYDROGENASE: LDH: 130 U/L (ref 94–250)

## 2011-01-07 LAB — CBC
Hemoglobin: 13 g/dL (ref 13.0–17.0)
MCHC: 34.2 g/dL (ref 30.0–36.0)
RBC: 4.46 MIL/uL (ref 4.22–5.81)
RDW: 15.9 % — ABNORMAL HIGH (ref 11.5–15.5)

## 2011-01-07 LAB — PROTIME-INR
INR: 1.1 (ref 0.00–1.49)
Prothrombin Time: 14.1 seconds (ref 11.6–15.2)

## 2011-01-07 LAB — APTT: aPTT: 29 seconds (ref 24–37)

## 2011-01-08 LAB — LACTATE DEHYDROGENASE: LDH: 155 U/L (ref 94–250)

## 2011-01-08 LAB — CBC
HCT: 36.8 % — ABNORMAL LOW (ref 39.0–52.0)
MCV: 83.4 fL (ref 78.0–100.0)
Platelets: 202 10*3/uL (ref 150–400)
WBC: 5.7 10*3/uL (ref 4.0–10.5)

## 2011-01-08 LAB — DIFFERENTIAL
Basophils Absolute: 0 10*3/uL (ref 0.0–0.1)
Lymphocytes Relative: 16 % (ref 12–46)
Neutro Abs: 3.7 10*3/uL (ref 1.7–7.7)
Neutrophils Relative %: 65 % (ref 43–77)

## 2011-01-08 LAB — COMPREHENSIVE METABOLIC PANEL
Albumin: 3.6 g/dL (ref 3.5–5.2)
BUN: 11 mg/dL (ref 6–23)
Chloride: 101 mEq/L (ref 96–112)
Creatinine, Ser: 0.78 mg/dL (ref 0.4–1.5)
GFR calc non Af Amer: >60 mL/min (ref >60)
Total Bilirubin: 0.4 mg/dL (ref 0.3–1.2)

## 2011-01-08 LAB — SEDIMENTATION RATE: Sed Rate: 52 mm/hr — ABNORMAL HIGH (ref 0–16)

## 2011-01-09 LAB — COMPREHENSIVE METABOLIC PANEL
ALT: 27 U/L (ref 0–53)
AST: 32 U/L (ref 0–37)
Albumin: 3.9 g/dL (ref 3.5–5.2)
CO2: 30 mEq/L (ref 19–32)
Chloride: 102 mEq/L (ref 96–112)
GFR calc Af Amer: >60 mL/min (ref >60)
GFR calc non Af Amer: >60 mL/min (ref >60)
Sodium: 137 mEq/L (ref 135–145)
Total Bilirubin: 0.6 mg/dL (ref 0.3–1.2)

## 2011-01-09 LAB — DIFFERENTIAL
Basophils Absolute: 0 10*3/uL (ref 0.0–0.1)
Basophils Relative: 0 % (ref 0–1)
Eosinophils Absolute: 0.2 10*3/uL (ref 0.0–0.7)
Lymphs Abs: 0.9 10*3/uL (ref 0.7–4.0)
Monocytes Relative: 14 % — ABNORMAL HIGH (ref 3–12)
Neutro Abs: 3.4 10*3/uL (ref 1.7–7.7)

## 2011-01-09 LAB — CBC
RBC: 4.58 MIL/uL (ref 4.22–5.81)
WBC: 5.2 10*3/uL (ref 4.0–10.5)

## 2011-01-10 ENCOUNTER — Encounter: Payer: Self-pay | Admitting: Nurse Practitioner

## 2011-01-10 DIAGNOSIS — G4700 Insomnia, unspecified: Secondary | ICD-10-CM

## 2011-01-10 DIAGNOSIS — F411 Generalized anxiety disorder: Secondary | ICD-10-CM | POA: Insufficient documentation

## 2011-01-14 ENCOUNTER — Encounter (HOSPITAL_COMMUNITY): Payer: 59 | Attending: Oncology

## 2011-01-14 ENCOUNTER — Other Ambulatory Visit (HOSPITAL_COMMUNITY): Payer: 59

## 2011-01-14 DIAGNOSIS — Z9221 Personal history of antineoplastic chemotherapy: Secondary | ICD-10-CM | POA: Insufficient documentation

## 2011-01-14 DIAGNOSIS — C8589 Other specified types of non-Hodgkin lymphoma, extranodal and solid organ sites: Secondary | ICD-10-CM | POA: Insufficient documentation

## 2011-01-14 DIAGNOSIS — Z452 Encounter for adjustment and management of vascular access device: Secondary | ICD-10-CM

## 2011-01-23 LAB — APTT
aPTT: 29 seconds (ref 24–37)
aPTT: 30 s (ref 24–37)

## 2011-01-23 LAB — DIFFERENTIAL
Basophils Absolute: 0 10*3/uL (ref 0.0–0.1)
Basophils Relative: 0 % (ref 0–1)
Eosinophils Relative: 3 % (ref 0–5)
Lymphocytes Relative: 7 % — ABNORMAL LOW (ref 12–46)
Monocytes Absolute: 0.7 10*3/uL (ref 0.1–1.0)
Neutro Abs: 5.7 10*3/uL (ref 1.7–7.7)

## 2011-01-23 LAB — CBC
HCT: 32.1 % — ABNORMAL LOW (ref 39.0–52.0)
HCT: 34.2 % — ABNORMAL LOW (ref 39.0–52.0)
HCT: 33.2 % — ABNORMAL LOW (ref 39.0–52.0)
Hemoglobin: 11.6 g/dL — ABNORMAL LOW (ref 13.0–17.0)
Hemoglobin: 11.3 g/dL — ABNORMAL LOW (ref 13.0–17.0)
MCHC: 33.8 g/dL (ref 30.0–36.0)
MCHC: 33.9 g/dL (ref 30.0–36.0)
MCV: 89.2 fL (ref 78.0–100.0)
MCV: 89.3 fL (ref 78.0–100.0)
MCV: 89.3 fL (ref 78.0–100.0)
MCV: 89.4 fL (ref 78.0–100.0)
MCV: 89.8 fL (ref 78.0–100.0)
MCV: 89.7 fL (ref 78.0–100.0)
Platelets: 246 10*3/uL (ref 150–400)
Platelets: 271 10*3/uL (ref 150–400)
Platelets: 302 10*3/uL (ref 150–400)
Platelets: 335 K/uL (ref 150–400)
Platelets: 337 10*3/uL (ref 150–400)
RBC: 3.6 MIL/uL — ABNORMAL LOW (ref 4.22–5.81)
RBC: 3.74 MIL/uL — ABNORMAL LOW (ref 4.22–5.81)
RBC: 3.83 MIL/uL — ABNORMAL LOW (ref 4.22–5.81)
RBC: 3.71 MIL/uL — ABNORMAL LOW (ref 4.22–5.81)
RDW: 15.2 % (ref 11.5–15.5)
RDW: 15.3 % (ref 11.5–15.5)
RDW: 15.9 % — ABNORMAL HIGH (ref 11.5–15.5)
RDW: 15.9 % — ABNORMAL HIGH (ref 11.5–15.5)
WBC: 5.7 K/uL (ref 4.0–10.5)
WBC: 6.5 10*3/uL (ref 4.0–10.5)
WBC: 7.9 10*3/uL (ref 4.0–10.5)
WBC: 8.9 10*3/uL (ref 4.0–10.5)
WBC: 7.1 10*3/uL (ref 4.0–10.5)

## 2011-01-23 LAB — POCT I-STAT 3, VENOUS BLOOD GAS (G3P V)
O2 Saturation: 58 %
TCO2: 27 mmol/L (ref 0–100)
pCO2, Ven: 42.4 mmHg — ABNORMAL LOW (ref 45.0–50.0)
pH, Ven: 7.397 — ABNORMAL HIGH (ref 7.250–7.300)
pO2, Ven: 30 mmHg (ref 30.0–45.0)

## 2011-01-23 LAB — COMPREHENSIVE METABOLIC PANEL WITH GFR
ALT: 53 U/L (ref 0–53)
AST: 33 U/L (ref 0–37)
Albumin: 2.9 g/dL — ABNORMAL LOW (ref 3.5–5.2)
Alkaline Phosphatase: 117 U/L (ref 39–117)
BUN: 10 mg/dL (ref 6–23)
CO2: 25 meq/L (ref 19–32)
Calcium: 9.2 mg/dL (ref 8.4–10.5)
Chloride: 101 meq/L (ref 96–112)
Creatinine, Ser: 0.78 mg/dL (ref 0.4–1.5)
GFR calc non Af Amer: >60 mL/min
Glucose, Bld: 108 mg/dL — ABNORMAL HIGH (ref 70–99)
Potassium: 4.4 meq/L (ref 3.5–5.1)
Sodium: 134 meq/L — ABNORMAL LOW (ref 135–145)
Total Bilirubin: 0.7 mg/dL (ref 0.3–1.2)
Total Protein: 6.2 g/dL (ref 6.0–8.3)

## 2011-01-23 LAB — POCT I-STAT 3, ART BLOOD GAS (G3+)
O2 Saturation: 97 %
Patient temperature: 36.4
TCO2: 28 mmol/L (ref 0–100)
pCO2 arterial: 32.1 mmHg — ABNORMAL LOW (ref 35.0–45.0)
pCO2 arterial: 39.4 mmHg (ref 35.0–45.0)
pCO2 arterial: 36 mmHg (ref 35.0–45.0)
pH, Arterial: 7.422 (ref 7.350–7.450)
pH, Arterial: 7.522 — ABNORMAL HIGH (ref 7.350–7.450)
pH, Arterial: 7.479 — ABNORMAL HIGH (ref 7.350–7.450)
pO2, Arterial: 84 mmHg (ref 80.0–100.0)

## 2011-01-23 LAB — CREATININE, SERUM
GFR calc Af Amer: >60 mL/min (ref >60)
GFR calc non Af Amer: >60 mL/min (ref >60)

## 2011-01-23 LAB — COMPREHENSIVE METABOLIC PANEL
AST: 48 U/L — ABNORMAL HIGH (ref 0–37)
Albumin: 2.8 g/dL — ABNORMAL LOW (ref 3.5–5.2)
Alkaline Phosphatase: 96 U/L (ref 39–117)
BUN: 12 mg/dL (ref 6–23)
CO2: 27 mEq/L (ref 19–32)
Chloride: 95 mEq/L — ABNORMAL LOW (ref 96–112)
Creatinine, Ser: 1 mg/dL (ref 0.4–1.5)
GFR calc non Af Amer: >60 mL/min (ref >60)
Potassium: 3.9 mEq/L (ref 3.5–5.1)
Total Bilirubin: 0.8 mg/dL (ref 0.3–1.2)

## 2011-01-23 LAB — TYPE AND SCREEN
ABO/RH(D): B POS
Antibody Screen: NEGATIVE

## 2011-01-23 LAB — BLOOD GAS, ARTERIAL
Acid-Base Excess: 3.5 mmol/L — ABNORMAL HIGH (ref 0.0–2.0)
Drawn by: 181601
pCO2 arterial: 39 mmHg (ref 35.0–45.0)
pH, Arterial: 7.459 — ABNORMAL HIGH (ref 7.350–7.450)
pO2, Arterial: 87.7 mmHg (ref 80.0–100.0)

## 2011-01-23 LAB — URINALYSIS, ROUTINE W REFLEX MICROSCOPIC
Bilirubin Urine: NEGATIVE
Glucose, UA: NEGATIVE mg/dL
Hgb urine dipstick: NEGATIVE
Ketones, ur: NEGATIVE mg/dL
Nitrite: NEGATIVE
Protein, ur: NEGATIVE mg/dL
Specific Gravity, Urine: 1.013 (ref 1.005–1.030)
Urobilinogen, UA: 1 mg/dL (ref 0.0–1.0)
pH: 6 (ref 5.0–8.0)

## 2011-01-23 LAB — POCT I-STAT 4, (NA,K, GLUC, HGB,HCT)
Glucose, Bld: 139 mg/dL — ABNORMAL HIGH (ref 70–99)
Hemoglobin: 11.9 g/dL — ABNORMAL LOW (ref 13.0–17.0)

## 2011-01-23 LAB — PROTIME-INR
INR: 1.26 (ref 0.00–1.49)
INR: 1.26 (ref 0.00–1.49)
Prothrombin Time: 15.7 s — ABNORMAL HIGH (ref 11.6–15.2)
Prothrombin Time: 15.7 seconds — ABNORMAL HIGH (ref 11.6–15.2)

## 2011-01-23 LAB — BASIC METABOLIC PANEL
BUN: 6 mg/dL (ref 6–23)
BUN: 7 mg/dL (ref 6–23)
CO2: 25 mEq/L (ref 19–32)
CO2: 29 mEq/L (ref 19–32)
Calcium: 8.3 mg/dL — ABNORMAL LOW (ref 8.4–10.5)
Calcium: 8.5 mg/dL (ref 8.4–10.5)
Chloride: 102 mEq/L (ref 96–112)
Chloride: 97 mEq/L (ref 96–112)
Creatinine, Ser: 0.66 mg/dL (ref 0.4–1.5)
Creatinine, Ser: 0.69 mg/dL (ref 0.4–1.5)
Creatinine, Ser: 0.72 mg/dL (ref 0.4–1.5)
GFR calc Af Amer: >60 mL/min (ref >60)
GFR calc Af Amer: >60 mL/min (ref >60)
GFR calc non Af Amer: >60 mL/min (ref >60)
Glucose, Bld: 121 mg/dL — ABNORMAL HIGH (ref 70–99)
Glucose, Bld: 132 mg/dL — ABNORMAL HIGH (ref 70–99)
Potassium: 4.1 mEq/L (ref 3.5–5.1)
Sodium: 132 mEq/L — ABNORMAL LOW (ref 135–145)

## 2011-01-23 LAB — POCT I-STAT, CHEM 8
Calcium, Ion: 1.21 mmol/L (ref 1.12–1.32)
Glucose, Bld: 114 mg/dL — ABNORMAL HIGH (ref 70–99)
HCT: 32 % — ABNORMAL LOW (ref 39.0–52.0)
TCO2: 24 mmol/L (ref 0–100)

## 2011-01-23 LAB — MAGNESIUM
Magnesium: 2.3 mg/dL (ref 1.5–2.5)
Magnesium: 2.5 mg/dL (ref 1.5–2.5)

## 2011-01-23 LAB — TISSUE CULTURE
Culture: NO GROWTH
Gram Stain: NONE SEEN

## 2011-01-23 LAB — ANAEROBIC CULTURE

## 2011-01-23 LAB — HEMOGLOBIN A1C
Hgb A1c MFr Bld: 5.9 % (ref 4.6–6.1)
Mean Plasma Glucose: 123 mg/dL

## 2011-01-23 LAB — GLUCOSE, CAPILLARY
Glucose-Capillary: 110 mg/dL — ABNORMAL HIGH (ref 70–99)
Glucose-Capillary: 125 mg/dL — ABNORMAL HIGH (ref 70–99)
Glucose-Capillary: 110 mg/dL — ABNORMAL HIGH (ref 70–99)

## 2011-01-23 LAB — BODY FLUID CULTURE

## 2011-01-23 LAB — SEDIMENTATION RATE: Sed Rate: 120 mm/hr — ABNORMAL HIGH (ref 0–16)

## 2011-01-24 LAB — BASIC METABOLIC PANEL
Calcium: 8.1 mg/dL — ABNORMAL LOW (ref 8.4–10.5)
GFR calc non Af Amer: >60 mL/min (ref >60)
Glucose, Bld: 147 mg/dL — ABNORMAL HIGH (ref 70–99)
Sodium: 130 mEq/L — ABNORMAL LOW (ref 135–145)

## 2011-01-24 LAB — LIPID PANEL
LDL Cholesterol: 102 mg/dL — ABNORMAL HIGH (ref 0–99)
Total CHOL/HDL Ratio: 4.8 RATIO
Triglycerides: 54 mg/dL (ref <150)
VLDL: 11 mg/dL (ref 0–40)

## 2011-01-24 LAB — COMPREHENSIVE METABOLIC PANEL
ALT: 49 U/L (ref 0–53)
Albumin: 3.2 g/dL — ABNORMAL LOW (ref 3.5–5.2)
Alkaline Phosphatase: 73 U/L (ref 39–117)
Alkaline Phosphatase: 75 U/L (ref 39–117)
BUN: 10 mg/dL (ref 6–23)
Calcium: 8.7 mg/dL (ref 8.4–10.5)
Creatinine, Ser: 0.88 mg/dL (ref 0.4–1.5)
Glucose, Bld: 116 mg/dL — ABNORMAL HIGH (ref 70–99)
Glucose, Bld: 144 mg/dL — ABNORMAL HIGH (ref 70–99)
Potassium: 3.7 mEq/L (ref 3.5–5.1)
Potassium: 3.9 mEq/L (ref 3.5–5.1)
Sodium: 134 mEq/L — ABNORMAL LOW (ref 135–145)
Total Protein: 6.7 g/dL (ref 6.0–8.3)
Total Protein: 7.2 g/dL (ref 6.0–8.3)

## 2011-01-24 LAB — PROTEIN ELECTROPH W RFLX QUANT IMMUNOGLOBULINS
Albumin ELP: 49.6 % — ABNORMAL LOW (ref 55.8–66.1)
Alpha-2-Globulin: 13.7 % — ABNORMAL HIGH (ref 7.1–11.8)
Beta Globulin: 6.2 % (ref 4.7–7.2)
Total Protein ELP: 6.8 g/dL (ref 6.0–8.3)

## 2011-01-24 LAB — CBC
HCT: 34.8 % — ABNORMAL LOW (ref 39.0–52.0)
Hemoglobin: 11.4 g/dL — ABNORMAL LOW (ref 13.0–17.0)
MCV: 93.6 fL (ref 78.0–100.0)
MCV: 93.7 fL (ref 78.0–100.0)
Platelets: 154 10*3/uL (ref 150–400)
Platelets: 155 10*3/uL (ref 150–400)
Platelets: 163 10*3/uL (ref 150–400)
Platelets: 191 10*3/uL (ref 150–400)
RDW: 12.7 % (ref 11.5–15.5)
RDW: 12.8 % (ref 11.5–15.5)
RDW: 12.9 % (ref 11.5–15.5)
WBC: 5.8 10*3/uL (ref 4.0–10.5)
WBC: 7.4 10*3/uL (ref 4.0–10.5)
WBC: 7.4 10*3/uL (ref 4.0–10.5)

## 2011-01-24 LAB — POCT I-STAT 3, VENOUS BLOOD GAS (G3P V)
Acid-Base Excess: 1 mmol/L (ref 0.0–2.0)
pCO2, Ven: 39.2 mmHg — ABNORMAL LOW (ref 45.0–50.0)
pH, Ven: 7.416 — ABNORMAL HIGH (ref 7.250–7.300)
pO2, Ven: 33 mmHg (ref 30.0–45.0)

## 2011-01-24 LAB — DIFFERENTIAL
Basophils Absolute: 0 10*3/uL (ref 0.0–0.1)
Basophils Relative: 0 % (ref 0–1)
Eosinophils Absolute: 0.2 10*3/uL (ref 0.0–0.7)
Eosinophils Relative: 3 % (ref 0–5)
Neutrophils Relative %: 76 % (ref 43–77)

## 2011-01-24 LAB — TSH: TSH: 1.762 u[IU]/mL (ref 0.350–4.500)

## 2011-01-24 LAB — CK TOTAL AND CKMB (NOT AT ARMC)
CK, MB: 1 ng/mL (ref 0.3–4.0)
CK, MB: 1.2 ng/mL (ref 0.3–4.0)
Relative Index: 0.9 (ref 0.0–2.5)
Relative Index: 1 (ref 0.0–2.5)
Relative Index: INVALID (ref 0.0–2.5)
Total CK: 129 U/L (ref 7–232)
Total CK: 143 U/L (ref 7–232)
Total CK: 74 U/L (ref 7–232)

## 2011-01-24 LAB — TROPONIN I
Troponin I: 0.02 ng/mL (ref 0.00–0.06)
Troponin I: 0.02 ng/mL (ref 0.00–0.06)
Troponin I: 0.03 ng/mL (ref 0.00–0.06)

## 2011-01-24 LAB — PROTIME-INR
INR: 1.12 (ref 0.00–1.49)
Prothrombin Time: 14.3 seconds (ref 11.6–15.2)

## 2011-01-24 LAB — POCT I-STAT 3, ART BLOOD GAS (G3+)
O2 Saturation: 97 %
pO2, Arterial: 83 mmHg (ref 80.0–100.0)

## 2011-01-24 LAB — HEMOGLOBIN A1C: Hgb A1c MFr Bld: 5.5 % (ref 4.6–6.1)

## 2011-01-25 LAB — DIFFERENTIAL
Basophils Absolute: 0 10*3/uL (ref 0.0–0.1)
Basophils Relative: 0 % (ref 0–1)
Monocytes Relative: 7 % (ref 3–12)
Neutro Abs: 2.9 10*3/uL (ref 1.7–7.7)
Neutrophils Relative %: 75 % (ref 43–77)

## 2011-01-25 LAB — COMPREHENSIVE METABOLIC PANEL
Alkaline Phosphatase: 69 U/L (ref 39–117)
BUN: 9 mg/dL (ref 6–23)
GFR calc non Af Amer: >60 mL/min (ref >60)
Glucose, Bld: 125 mg/dL — ABNORMAL HIGH (ref 70–99)
Potassium: 3.8 mEq/L (ref 3.5–5.1)
Total Protein: 7.1 g/dL (ref 6.0–8.3)

## 2011-01-25 LAB — SEDIMENTATION RATE: Sed Rate: 3 mm/hr (ref 0–16)

## 2011-01-25 LAB — CBC
HCT: 40.2 % (ref 39.0–52.0)
Hemoglobin: 14.2 g/dL (ref 13.0–17.0)
MCHC: 35.3 g/dL (ref 30.0–36.0)
RDW: 13.1 % (ref 11.5–15.5)

## 2011-01-25 LAB — LACTATE DEHYDROGENASE: LDH: 123 U/L (ref 94–250)

## 2011-01-27 LAB — COMPREHENSIVE METABOLIC PANEL
ALT: 74 U/L — ABNORMAL HIGH (ref 0–53)
AST: 59 U/L — ABNORMAL HIGH (ref 0–37)
Albumin: 3.9 g/dL (ref 3.5–5.2)
Alkaline Phosphatase: 69 U/L (ref 39–117)
Chloride: 103 mEq/L (ref 96–112)
GFR calc Af Amer: >60 mL/min (ref >60)
Potassium: 3.7 mEq/L (ref 3.5–5.1)
Sodium: 136 mEq/L (ref 135–145)
Total Bilirubin: 0.8 mg/dL (ref 0.3–1.2)
Total Protein: 6.8 g/dL (ref 6.0–8.3)

## 2011-01-27 LAB — DIFFERENTIAL
Basophils Absolute: 0 10*3/uL (ref 0.0–0.1)
Basophils Relative: 0 % (ref 0–1)
Eosinophils Relative: 4 % (ref 0–5)
Monocytes Absolute: 0.3 10*3/uL (ref 0.1–1.0)
Monocytes Relative: 10 % (ref 3–12)

## 2011-01-27 LAB — CBC
HCT: 39 % (ref 39.0–52.0)
Platelets: 127 10*3/uL — ABNORMAL LOW (ref 150–400)
WBC: 3.2 10*3/uL — ABNORMAL LOW (ref 4.0–10.5)

## 2011-01-29 ENCOUNTER — Other Ambulatory Visit: Payer: Self-pay | Admitting: Cardiology

## 2011-01-30 LAB — CBC
HCT: 37.3 % — ABNORMAL LOW (ref 39.0–52.0)
MCHC: 34.7 g/dL (ref 30.0–36.0)
MCV: 88.8 fL (ref 78.0–100.0)
Platelets: 123 10*3/uL — ABNORMAL LOW (ref 150–400)

## 2011-01-30 LAB — COMPREHENSIVE METABOLIC PANEL
AST: 75 U/L — ABNORMAL HIGH (ref 0–37)
BUN: 9 mg/dL (ref 6–23)
CO2: 29 mEq/L (ref 19–32)
Calcium: 9.3 mg/dL (ref 8.4–10.5)
Creatinine, Ser: 0.78 mg/dL (ref 0.4–1.5)
GFR calc Af Amer: >60 mL/min (ref >60)
GFR calc non Af Amer: >60 mL/min (ref >60)
Total Bilirubin: 0.5 mg/dL (ref 0.3–1.2)

## 2011-01-30 LAB — DIFFERENTIAL
Basophils Absolute: 0 10*3/uL (ref 0.0–0.1)
Eosinophils Relative: 0 % (ref 0–5)
Lymphocytes Relative: 20 % (ref 12–46)
Lymphs Abs: 0.8 10*3/uL (ref 0.7–4.0)
Neutro Abs: 2.6 10*3/uL (ref 1.7–7.7)

## 2011-01-31 LAB — CBC
MCHC: 33.6 g/dL (ref 30.0–36.0)
MCV: 86.4 fL (ref 78.0–100.0)
Platelets: 205 10*3/uL (ref 150–400)
RBC: 4.06 MIL/uL — ABNORMAL LOW (ref 4.22–5.81)

## 2011-01-31 LAB — COMPREHENSIVE METABOLIC PANEL
ALT: 45 U/L (ref 0–53)
AST: 30 U/L (ref 0–37)
Albumin: 3.5 g/dL (ref 3.5–5.2)
CO2: 25 mEq/L (ref 19–32)
Calcium: 9.6 mg/dL (ref 8.4–10.5)
Creatinine, Ser: 0.79 mg/dL (ref 0.4–1.5)
GFR calc Af Amer: >60 mL/min (ref >60)
GFR calc non Af Amer: >60 mL/min (ref >60)
Sodium: 136 mEq/L (ref 135–145)
Total Protein: 6.8 g/dL (ref 6.0–8.3)

## 2011-01-31 LAB — DIFFERENTIAL
Eosinophils Absolute: 0 10*3/uL (ref 0.0–0.7)
Eosinophils Relative: 0 % (ref 0–5)
Lymphocytes Relative: 8 % — ABNORMAL LOW (ref 12–46)
Lymphs Abs: 1 10*3/uL (ref 0.7–4.0)
Monocytes Absolute: 1.5 10*3/uL — ABNORMAL HIGH (ref 0.1–1.0)
Monocytes Relative: 13 % — ABNORMAL HIGH (ref 3–12)

## 2011-01-31 LAB — LACTATE DEHYDROGENASE: LDH: 131 U/L (ref 94–250)

## 2011-02-04 LAB — DIFFERENTIAL
Basophils Relative: 1 % (ref 0–1)
Basophils Relative: 0 % (ref 0–1)
Eosinophils Absolute: 0.1 10*3/uL (ref 0.0–0.7)
Eosinophils Absolute: 0 10*3/uL (ref 0.0–0.7)
Eosinophils Relative: 0 % (ref 0–5)
Lymphs Abs: 0.5 10*3/uL — ABNORMAL LOW (ref 0.7–4.0)
Monocytes Absolute: 1.4 10*3/uL — ABNORMAL HIGH (ref 0.1–1.0)
Monocytes Relative: 14 % — ABNORMAL HIGH (ref 3–12)
Monocytes Relative: 13 % — ABNORMAL HIGH (ref 3–12)
Neutro Abs: 3.7 10*3/uL (ref 1.7–7.7)
Neutrophils Relative %: 73 % (ref 43–77)

## 2011-02-04 LAB — COMPREHENSIVE METABOLIC PANEL
ALT: 76 U/L — ABNORMAL HIGH (ref 0–53)
ALT: 48 U/L (ref 0–53)
AST: 33 U/L (ref 0–37)
Albumin: 3.4 g/dL — ABNORMAL LOW (ref 3.5–5.2)
Alkaline Phosphatase: 72 U/L (ref 39–117)
Alkaline Phosphatase: 71 U/L (ref 39–117)
BUN: 8 mg/dL (ref 6–23)
CO2: 25 mEq/L (ref 19–32)
Calcium: 9.2 mg/dL (ref 8.4–10.5)
Chloride: 101 mEq/L (ref 96–112)
GFR calc non Af Amer: >60 mL/min (ref >60)
Glucose, Bld: 110 mg/dL — ABNORMAL HIGH (ref 70–99)
Potassium: 3.3 mEq/L — ABNORMAL LOW (ref 3.5–5.1)
Sodium: 137 mEq/L (ref 135–145)
Sodium: 135 mEq/L (ref 135–145)
Total Bilirubin: 0.3 mg/dL (ref 0.3–1.2)
Total Protein: 6.2 g/dL (ref 6.0–8.3)
Total Protein: 7.1 g/dL (ref 6.0–8.3)

## 2011-02-04 LAB — CBC
HCT: 34 % — ABNORMAL LOW (ref 39.0–52.0)
Hemoglobin: 11.4 g/dL — ABNORMAL LOW (ref 13.0–17.0)
MCHC: 33.6 g/dL (ref 30.0–36.0)
MCV: 83.4 fL (ref 78.0–100.0)
Platelets: 261 10*3/uL (ref 150–400)
RBC: 4.07 MIL/uL — ABNORMAL LOW (ref 4.22–5.81)
RDW: 17.1 % — ABNORMAL HIGH (ref 11.5–15.5)
RDW: 17.5 % — ABNORMAL HIGH (ref 11.5–15.5)
WBC: 10.7 10*3/uL — ABNORMAL HIGH (ref 4.0–10.5)

## 2011-02-04 LAB — LACTATE DEHYDROGENASE: LDH: 150 U/L (ref 94–250)

## 2011-02-04 LAB — MAGNESIUM: Magnesium: 2.2 mg/dL (ref 1.5–2.5)

## 2011-02-05 LAB — COMPREHENSIVE METABOLIC PANEL
Albumin: 3.2 g/dL — ABNORMAL LOW (ref 3.5–5.2)
BUN: 10 mg/dL (ref 6–23)
Calcium: 9.2 mg/dL (ref 8.4–10.5)
Chloride: 103 mEq/L (ref 96–112)
Creatinine, Ser: 0.71 mg/dL (ref 0.4–1.5)
Total Bilirubin: 0.4 mg/dL (ref 0.3–1.2)

## 2011-02-05 LAB — CBC
HCT: 33.9 % — ABNORMAL LOW (ref 39.0–52.0)
MCHC: 33.6 g/dL (ref 30.0–36.0)
MCV: 85.1 fL (ref 78.0–100.0)
Platelets: 225 10*3/uL (ref 150–400)
RDW: 18.3 % — ABNORMAL HIGH (ref 11.5–15.5)
WBC: 8.9 10*3/uL (ref 4.0–10.5)

## 2011-02-05 LAB — LACTATE DEHYDROGENASE: LDH: 121 U/L (ref 94–250)

## 2011-02-05 LAB — GLUCOSE, CAPILLARY: Glucose-Capillary: 97 mg/dL (ref 70–99)

## 2011-02-05 LAB — DIFFERENTIAL
Basophils Absolute: 0 10*3/uL (ref 0.0–0.1)
Lymphocytes Relative: 10 % — ABNORMAL LOW (ref 12–46)
Monocytes Absolute: 1 10*3/uL (ref 0.1–1.0)
Neutro Abs: 7 10*3/uL (ref 1.7–7.7)

## 2011-02-25 ENCOUNTER — Encounter (HOSPITAL_COMMUNITY): Payer: 59 | Attending: Oncology

## 2011-02-25 DIAGNOSIS — C8589 Other specified types of non-Hodgkin lymphoma, extranodal and solid organ sites: Secondary | ICD-10-CM

## 2011-02-25 DIAGNOSIS — Z9221 Personal history of antineoplastic chemotherapy: Secondary | ICD-10-CM | POA: Insufficient documentation

## 2011-02-25 DIAGNOSIS — Z452 Encounter for adjustment and management of vascular access device: Secondary | ICD-10-CM

## 2011-02-28 ENCOUNTER — Telehealth: Payer: Self-pay | Admitting: Cardiology

## 2011-02-28 NOTE — Telephone Encounter (Signed)
Pt wife wants hubsand to have echo prior to appointment.

## 2011-02-28 NOTE — Telephone Encounter (Signed)
Spoke with pt  informed will let Dr Shirlee Latch decide if he needs another echo at next  appt with Dr Shirlee Latch on 03/06/11 at 9:30 am . verbalized understanding./cy

## 2011-03-04 ENCOUNTER — Encounter: Payer: Self-pay | Admitting: Cardiology

## 2011-03-05 NOTE — H&P (Signed)
NAME:  SEVERIANO, UTSEY NO.:  [id number]   MEDICAL RECORD NO.:  [id number]          PATIENT TYPE:  AMB   LOCATION:  DAY                           FACILITY:  [facility]   PHYSICIAN:  Dalia Heading, M.D.  DATE OF BIRTH:  1955/10/23   DATE OF ADMISSION:  DATE OF DISCHARGE:  LH                              HISTORY & PHYSICAL   CHIEF COMPLAINT:  Lymphoma, need for central venous access.   HISTORY OF PRESENT ILLNESS:  The patient is a 55 year old white male who  is referred for port-A-Cath insertion.  He needs a central venous access  for chemotherapy in treating non-Hodgkin lymphoma.   PAST MEDICAL HISTORY:  Back problems.   PAST SURGICAL HISTORY:  Back surgery, hemorrhoidectomy, and sinus  surgery.   CURRENT MEDICATIONS:  Clonazepam, trazodone. Cymbalta, and multivitamin.   ALLERGIES:  PENICILLIN.   REVIEW OF SYSTEMS:  The patient denies drinking or smoking.  He denies  any other cardiopulmonary difficulties or bleeding disorders.   PHYSICAL EXAMINATION:  GENERAL:  The patient is well-developed, well-  nourished white male in no acute distress.  LUNGS:  Clear to auscultation with equal breath sounds bilaterally.  HEART:  Regular rate and rhythm without S3, S4, or murmurs.   IMPRESSION:  Lymphoma, need for central venous access.   PLAN:  The patient is scheduled for Port-A-Cath insertion on September 02, 2008.  The risks and benefits of the procedure including bleeding,  infection, and pneumothorax were fully explained to the patient, and  gave informed consent.      Dalia Heading, M.D.  Electronically Signed     MAJ/MEDQ  D:  08/30/2008  T:  08/31/2008  Job:  [id number]   cc:   Ladona Horns. Mariel Sleet, MD  Fax: [phone]

## 2011-03-05 NOTE — Op Note (Signed)
NAMEJOSHUE, BADAL NO.:  [id number]   MEDICAL RECORD NO.:  [id number]          PATIENT TYPE:  INP   LOCATION:  3113                         FACILITY:  [facility]   PHYSICIAN:  Clydene Fake, M.D.  DATE OF BIRTH:  March 29, 1956   DATE OF PROCEDURE:  07/29/2008  DATE OF DISCHARGE:                               OPERATIVE REPORT   DIAGNOSIS:  T6 tumor causing cord compression starting in T5 and T7.   POSTOPERATIVE DIAGNOSIS:  T6 tumor causing cord compression starting in  T5 and T7.   PROCEDURE:  Thoracic laminectomy and __________ transversectomy for  tumor resection and decompression of spinal cord, stabilization with  Alphateck pedicle screw instrumentation segmented from T4 through T9.   SURGEON:  Clydene Fake, MD   ASSISTANT:  Coletta Memos, MD   ANESTHESIA:  Endotracheal tube anesthesia.   ESTIMATED BLOOD LOSS:  2000 mL.   BLOOD GIVEN:  Two units of packed cells given.   DRAINS:  None.   COMPLICATIONS:  None.   REASON FOR PROCEDURE:  The patient is a 55 year old gentleman who has  had pain of the right flank, recommended MRI. Neuro started with CAT  scan and then MRI showing very large progressive tumor in the body T6  causing destruction which was running down T7 and to T5, worse to the  right side causing significant cord compression.  On exam, the patient  was neurologically intact with significant cord compression __________  unknown tumor.  No other sign of metastatic disease.  The patient was  brought in for resection of the tumor and decompression of spinal cord  with stabilization of the spine.   PROCEDURE IN DETAIL:  The patient was brought to the operating room.  General anesthesia was induced.  The patient was placed in prone  position and flat rolls were placed and prepped and draped in a sterile  fashion.  The area of incision was injected with 20 mL of 1% lidocaine  with epinephrine.  Incision then made over the made mid thoracic  spine.  This was done after lateral x-rays were obtained after a marker was  placed at T6 disk space and we started the incision over that.  Incision  was taken down to the fascia.  Hemostasis was obtained with Bovie  cauterization.  The fascia was incised and subperiosteal dissection was  done over the spinous process and lamina out to the facets and the  transverse processes bilaterally.  Marker was placed and another x-ray  was obtained.  We confirmed our positioning at the T6 and T7.  From  there, we continued our exposure, so we could see the pedicle entry  point for T4 down to the pedicle entry point for T9.  Hemostasis was  obtained with bipolar cauterization and Gelfoam thrombin.  We then  performed a laminectomy T6 extending up to the right side and lateral  side of T5 and down to T7.  As we dissected down, we found the tumor in  the epidural space and then going out laterally removed the facet and  transverse process and __________ was invaded with tumor.  Tumor sent  for pathology and __________ We sent for permanent section.  The nerve  root coming out there was found.  We preserved this and dissected around  that and continued removing the tumor as we developed __________  vertebral body would have been T6.  We were able to push the tumor down  away from the thecal sac further decompressing the thecal sac from the  lateral and anterior approaches.  We found some of the tumor extending  caudally and cephalad around the right side and removed this, again  decompressing the canal and spinal cord.  We had  good decompression of  the cord.  We had a large defect in the T6 vertebral body and most of  the tumors removed __________ which did not get a complete resection of  this.  There was still residual tumor.  We then using the intraoperative  landmarks and fluoroscopy, placed pedicle screws 5.5 mm wide and placed  at T8 and T9 __________ rod placed to T4 and T5 bilaterally.  AP  and  lateral fluoroscopic images were used to confirm our position of the  screws.  Then, we used a small ball probe after making the __________  pedicle to make sure we had good bony circumference prior to placing the  screws.  Rods were then cut to size and then placement of the screw  heads.  Two of them were placed from T4-T9 __________ screw heads.  Locking nuts were then placed.  This was done bilaterally and 2 cross  connectors were then placed for stabilization of the spine.  Attention  was then taken back to the laminectomy defect and the tumor resection.  We had good hemostasis.  Gelfoam thrombin.  This was irrigated out.  The  __________ retractors were removed.  Fascia was closed with 0 Vicryl  interrupted sutures.  Subcutaneous tissue closed with 2-0 and 3-0 Vicryl  interrupted sutures.  Skin was closed with staples.  A dressing was  placed.  The patient was placed back in the supine position, awoken from  anesthesia, and transferred to recovery room in stable condition.           ______________________________  Clydene Fake, M.D.     JRH/MEDQ  D:  07/30/2008  T:  07/31/2008  Job:  [id number]

## 2011-03-05 NOTE — Discharge Summary (Signed)
NAMEZARIAN, COLPITTS NO.:  [id number]   MEDICAL RECORD NO.:  [id number]          PATIENT TYPE:  INP   LOCATION:  3032                         FACILITY:  [facility]   PHYSICIAN:  Clydene Fake, M.D.  DATE OF BIRTH:  1956-09-26   DATE OF ADMISSION:  07/29/2008  DATE OF DISCHARGE:  08/02/2008                               DISCHARGE SUMMARY   ADMITTING DIAGNOSIS:  T6 tumor causing cord compression.   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   REASON FOR ADMISSION:  The patient had a 3-week history of gradual onset  right-sided chest pain, intraoperative CT chest and pelvis which showed  mass in the vertebral column at T6, obstructive mass of the T6 vertebral  body causing spinal cord compression, was admitted to Medical Service.  We were called for consultation.  On July 29, 2008, Neurosurgery  consultation was done for the right flank pain but otherwise intact  neurologically, but did have significant cord compression in T5, 6, and  7 levels.  After a lengthy discussion with the patient, it was decided  to proceed to surgery for:  1. Decompression of cord.  2. Tissue for diagnosis, no other areas of primary tumor seen.   Oncology, Dr. Darnelle Catalan was also consulted.  On July 30, 2008, the  patient underwent a surgery of T6 with a thoracic laminectomy also tumor  resection with stabilization with instrumentation, segmental  instrumentation and pedicle screws from T4 through T9.  Postoperatively,  the patient was transferred to intensive care unit.  He tolerated the  procedure well.  The following day, he was up ambulating in the room to  a chair.  __________ with a brace.  Motor and sensory exam is intact.  Incision was clean, dry, and intact.  He was really doing well and was  transferred to the floor.  On August 02, 2008, there was slight postop  ileus that was starting to resolve, positive flatus and did have bowel  movement, and he is  ambulating well.  The incision was doing well, and  we are still waiting for final path, by the end of the day it did come  back as mentioned above.  He was discharged to home in stable and fair  condition.  No strenuous activity, up with braces only.   FOLLOWUP:  Follow up in 10-12 days for staple removal.  Follow up with  Oncology in 1 week.   DISCHARGE MEDICATIONS:  Percocet and Flexeril p.r.n. plus home meds.           ______________________________  Clydene Fake, M.D.     JRH/MEDQ  D:  09/22/2008  T:  09/23/2008  Job:  [id number]

## 2011-03-05 NOTE — Consult Note (Signed)
[name], SOUTH NO.:  [id number]   MEDICAL RECORD NO.:  [id number]          [place] TYPE:  INP   LOCATION:  3032                         FACILITY:  [facility]   PHYSICIAN:  Valentino Hue. Magrinat, M.D.DATE OF BIRTH:  12-22-1955   DATE OF CONSULTATION:  DATE OF DISCHARGE:  08/02/2008                                 CONSULTATION   Matthew Irwin is a 55 year old [place] man referred by Dr. Phoebe Perch for  evaluation and treatment in Matthew setting of newly diagnosed non-Hodgkin's  lymphoma.   Matthew Irwin originally presented with right flank and back pain and was  initially evaluated by primary care with a chest x-ray which was not all  that informative.  He was treated with a Medrol Dosepak with some relief  of symptoms, but after a week or two, Matthew symptoms recurred and with Matthew  pain worsening, a CT of Matthew chest was obtained which showed a  destructive lesion at Matthew T6 level.  Matthew Irwin was admitted to Matthew  [facility] and an MRI of Matthew spine was obtained which showed a  mass centered to Matthew right of T6 measuring up to 5.7 cm extending  between T5 and T7, also extending into Matthew spinal canal with  displacement of Matthew spinal cord posteriorly.  There was also a  peripherally enhancing lesion along Matthew inferior aspect of T10 measuring  13.5 mm, felt more likely to be an atypical hemangioma.  There were  degenerative disease changes noted as well.  CT of Matthew chest and abdomen  showed in addition to this lesion, some small subcentimeter lower lobe  pulmonary nodules bilaterally, but no mediastinal or hilar adenopathy.  CT of Matthew abdomen obtained on July 28, 2008, showed in addition to Matthew  above some diffuse fatty infiltration of Matthew liver and a 9-mm left renal  cyst.   With this information, Dr. Phoebe Perch was consulted and after appropriate  discussion with Matthew Irwin and family, he proceeded to thoracic  laminectomy and spinal cord decompression with Alphatec  pedicle screw  stabilization between T4 and T9 performed on July 29, 2008.  Matthew  Irwin tolerated Matthew procedure well and is currently ready for  discharge.  We are asked to initiate Matthew consultation process and orient  Matthew Irwin and family to his overall situation.   PAST MEDICAL HISTORY:  Significant for hypercholesterolemia, anxiety,  history of sinus surgery, history of hemorrhoidectomy, history of 15-20  pack year tobacco abuse history, Matthew Irwin quitting about 10 years  ago; and history of fatty liver and degenerative disk disease as noted.   FAMILY HISTORY:  Matthew Irwin's father is alive at age 84.  Matthew Irwin  believes Matthew father may have early colon cancer, but is not sure.  Matthew  Irwin's mother is alive at age 70.  Matthew Irwin has two sisters.  There is no history of leukemia, lymphoma, or other blood disorder or  white cell cancer in Matthew family.   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  He has been married 31 years and his  wife works with him in Matthew [employer].  Matthew daughter is a Biomedical scientist at [employer], currently in her second year.  She hopes to do OB  nursing eventually.  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   ALLERGIES:  He is allergic to PENICILLIN which causes hives.   HOME MEDICATIONS:  He was on Klonopin, trazodone, Cymbalta, aspirin,  multivitamin, and hydrocodone.   HEALTH MAINTENANCE:  There is currently no history of tobacco abuse.  Matthew Irwin has a 15-20 pack-year history is noted.  There is no history  of EtOH abuse.  Matthew Irwin had a colonoscopy under Dr. Cleotis Nipper 4-5  years ago.  Matthew Irwin's hypercholesterolemia is moderate.  They have  treated it with statins, but he has tolerated that poorly.  Matthew  Irwin's PSA was checked this admission and was 0.26.   REVIEW OF SYSTEMS:  Matthew Irwin had pain, but no focal  weakness or  paresthesias.  He is currently in a brace.  He is alert, oriented x3,  appropriately concerned, and only in mild pain.  He has had at least one  small bowel movement since Matthew surgery.   Pre-admission, Matthew Irwin denies history of fevers, drenching night  sweats, rash, unexplained fatigue, unexplained weight loss, or other  systemic symptoms.  He is not aware of any adenopathy.  There have been  no intercurrent infections.  A detailed review of systems was otherwise  noncontributory.   PHYSICAL EXAMINATION:  GENERAL:  Mr. Suess is a middle-aged white male,  who is 71 inches tall and weighs 84 kg.  VITAL SIGNS:  Temperature is 98.6, pulse 92, respirations 16, blood  pressure 142/89, and his room air saturation is 92%.  HEENT:  Sclerae are not icteric.  NECK:  I do not palpate any cervical, supraclavicular, or axillary  adenopathy.  I did not examine Matthew inguinal areas.  LUNGS:  Lungs were auscultated incompletely, Matthew Irwin being in a  brace, but I did not hear any crackles or wheezes in Matthew posterior  superior area.  HEART:  Regular rate and rhythm.  No murmur appreciated.  EXTREMITIES:  No peripheral edema.  NEUROLOGIC:  Nonfocal.   LABORATORY DATA:  Lab work obtained this admission included an admission  CBC of 6.0, hemoglobin 15.1, MCV 90.6, and platelets 180,000.  PT and  PTT were unremarkable.  Lipid panel showed a cholesterol of 222,  triglycerides 259, HDL 26, and LDL 144.  His ratio was really quite poor  at 8.5.  ESR was 8.  TSH 2.73.  PSA as noted 0.26.  C-reactive protein  was 1.1.  Liver function tests showed a total bilirubin of 0.8, alkaline  phosphatase 77, AST 44, and ALT of 61 in this Irwin with known fatty  liver.  Albumin was 4.3, glucose 105, BUN 15, creatinine 0.95.  Electrolytes were otherwise unremarkable.  Multiple myeloma panel was  obtained July 29, 2008, and is still pending.   Films have been already described.   Pathology, Matthew  Irwin's pathology ([id number] and [id number] - [id number]) show a  non-Hodgkin lymphoma, large cell-type, high-grade, staining for CD3,  CD20, LCA, CD79a with a predominance of B cells.  Clonality could not be  assessed because of Matthew quality of Matthew sample.   IMPRESSION AND PLAN:  A 55 year old [place] man with a new diagnosis  of non-Hodgkin's lymphoma, B-cell type, high-grade, involving Matthew T6  area, presenting with pain and impending cord compression; status post  lumbar laminectomy with stabilization July 29, 2008; without obvious  involvement of other areas.   Today, we had basically an orientation session and Matthew Irwin  understands that non-Hodgkin lymphomas are frequently curable, that they  are usually treated with immunotherapy and chemotherapy and that in his  case, radiation therapy also may play a role.  He understands that  further studies may be needed to fully stage him and that he will  discuss further staging studies and Matthew overall treatment plan with Dr.  Glenford Peers in [place].  I have contacted Radiation Oncology and  Dr. Jodi Geralds is evaluating Matthew Irwin for a preliminary opinion  regarding  radiation.  Matthew Irwin will see Dr. Mariel Sleet within Matthew next 2 weeks  ifat all possible. A definitive plan will be discussed and implemented  at that time.   I appreciate seeing this Irwin in consultation with you.  Please let  me know if I can be of further help at this time.      Valentino Hue. Magrinat, M.D.  Electronically Signed     GCM/MEDQ  D:  08/02/2008  T:  08/03/2008  Job:  [id number]   cc:   Clydene Fake, M.D.  Henry A. Cleotis Nipper, M.D.  Ernestina Penna, M.D.  Ladona Horns. Mariel Sleet, MD

## 2011-03-05 NOTE — Op Note (Signed)
NAME:  Matthew Irwin, LINHARES NO.:  [id number]   MEDICAL RECORD NO.:  [id number]          PATIENT TYPE:  AMB   LOCATION:  DAY                           FACILITY:  [facility]   PHYSICIAN:  Dalia Heading, M.D.  DATE OF BIRTH:  1955/11/21   DATE OF PROCEDURE:  DATE OF DISCHARGE:                               OPERATIVE REPORT   PREOPERATIVE DIAGNOSES:  Lymphoma, need for central venous access for  chemotherapy.   POSTOPERATIVE DIAGNOSIS:  Lymphoma, need for central venous access for  chemotherapy.   PROCEDURE:  Port-A-Cath insertion.   SURGEON:  Dalia Heading, MD   ANESTHESIA:  MAC.   INDICATIONS:  The patient is a 55 year old white male who presents for  Port-A-Cath insertion for chemotherapy.  The risks and benefits of the  procedure including bleeding, infection, pneumothorax were fully  explained to the patient, gave informed consent.   PROCEDURE NOTE:  The patient was placed in the Trendelenburg position  after the left upper chest was prepped and draped using the usual  sterile technique with Betadine.  Surgical site confirmation was  performed.   A 1% Xylocaine was used for local anesthesia.  A transverse incision was  made below the left clavicle.  Subcutaneous pocket was then formed.  A  needle was advanced into the left subclavian vein without difficulty.  Guidewire was then advanced into the right atrium under fluoroscopic  guidance.  An introducer and peel away sheath were then placed over the  guidewire.  The catheter was inserted through the peel away sheath and  peel away sheath was removed.  The catheter was then attached to the  port and the port placed in subcutaneous pocket.  Adequate positioning  was confirmed by fluoroscopy.  The port was flushed with 3000 units of  heparin.  The subcutaneous layer was reapproximated using a 3-0 Vicryl  interrupted suture.  The skin was closed using a 4-0 Vicryl subcuticular  suture.  Dermabond was then  applied.   All tape and needle counts were correct at the end of procedure.  The  patient was transferred to PACU in stable condition.  Chest x-ray will  be performed at that time.   COMPLICATIONS:  None.   SPECIMEN:  None.   BLOOD LOSS:  Minimal.      Dalia Heading, M.D.  Electronically Signed     MAJ/MEDQ  D:  09/02/2008  T:  09/02/2008  Job:  [id number]   cc:   Ladona Horns. Mariel Sleet, MD  Fax: [phone]

## 2011-03-05 NOTE — Assessment & Plan Note (Signed)
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  He is seen today in the office with complaint of a small  area on his sternal incision, which appears to have blistered and is  slightly raised and painful.  He denies fevers, chills, or other  constitutional symptoms.  He has no significant clicking in the sternum.  He otherwise feels quite well.   PHYSICAL EXAMINATION:  Blood pressure is 102/62, pulse 107, respirations  18, and oxygen saturation is 93% on room air.  On inspection of the  wound, there is a tiny less than 1-mm area of slightly raised skin that  appears to have a small amount of suture material in it.   BRIEF PROCEDURE:  After Betadine prep, a small portion of Vicryl suture  was removed from the sternal wound incision.  There was no underlying  purulence or drainage.  The tissue underneath the suture appears beefy  red and well granulated.   ASSESSMENT:  A small suture abscess in the sternal incision.  The plan  is for a small dressing to be placed on it and he will monitor the wound  closely.  We will follow up on a p.r.n. basis if there are any further  difficulties.   Rowe Clack, P.A.-C.   Sherryll Burger  D:  12/04/2009  T:  12/05/2009  Job:  [id number]   cc:   Salvatore Decent. Dorris Fetch, M.D.

## 2011-03-05 NOTE — Consult Note (Signed)
NAMEDEMETREUS, LOTHAMER NO.:  [id number]   MEDICAL RECORD NO.:  [id number]          PATIENT TYPE:  INP   LOCATION:  3006                         FACILITY:  [facility]   PHYSICIAN:  Clydene Fake, M.D.  DATE OF BIRTH:  [birth year]   DATE OF CONSULTATION:  07/29/2008  DATE OF DISCHARGE:                                 CONSULTATION   CONSULTATION:  Thoracic spine tumor.   CONSULTATION REQUESTED BY:  Marcellus Scott, MD   HISTORY OF PRESENT ILLNESS:  The patient is a 55 year old gentleman who  has a two-week history of right-sided pain radiating around his chest  and around the T6 level.  Steroid dose pack resolved the pain for about  three days.  He has been on some NSAIDs.  Chest x-ray was done, followed  with a CT of abdomen and then CT of chest and an MRI of the thoracic  spine.  The patient admitted for further metastatic workup and  evaluation.  He has no complaints of weakness, numbness, or tingling in  the legs.  No problems with walking.  No spasticity in the legs.  No  clumsiness.  He has had just this mild back ache with some pain  radiating around towards the right nipple.   PAST MEDICAL HISTORY:  1. Anxiety.  2. Increased cholesterol.   PAST SURGICAL HISTORY:  1. Hemorrhoidectomy.  2. Sinus surgery.   MEDICATIONS:  1. Klonopin.  2. Trazodone.  3. Cymbalta.  4. Baby aspirin.  5. Multivitamins.  6. Hydrocodone p.r.n.   SOCIAL HISTORY:  He is married.  Employed in land surveying.  He does  not smoke.  Quitting 10 years ago or so, but has 15-20-year pack history  prior to that.   FAMILY HISTORY:  Noncontributory.   REVIEW OF SYSTEMS:  Otherwise, negative.   PHYSICAL EXAMINATION:  GENERAL:  Pleasant.  No apparent distress.  MUSCULOSKELETAL:  There is no tenderness of the spine.  Motor strength  and sensation are intact.  Reflex is symmetric.  No clonus.  Gait is  normal.  Can tandem gait well.   DATA REVIEW:  CTs and MRIs were evaluated.  There  was destructiveness  over the T6 to the right side, just trying and invading the T6 vertebrae  and going out to the right side laterally extending into the epidural  space causing a spinal cord compression, invading the facet, and into  the rib and extends superiorly and invades T5 and goes caudally into T7  with significant flattening of the spinal cord.  There are no  significant cord changes seen.  There is still some slight rim of  posterior spinal fluid into the cord, though it is definitely pushed  towards the inner left side.   ASSESSMENT AND PLAN:  The patient with a large invasive destructive  mass,  T5, T6, and T7 causing a cord compression, though the patient is  neurologically intact on exam and just having some radicular pain.  Options such as further workup or biopsy versus anterior versus  posterior surgery for resection were discussed with  the patient.  Because of the spinal cord compression and because he is so intact and  likely we will need to decompress the spinal cord at some point then we  could address this tumor into the posterior approach to decompress the  spinal cord and remove as much tumor as possible,  getting the tissue  for pathology and supporting the spine with posterior instrumentation.   If there is still need later to induce an anterior approach for further  tumor resection or further anterior support, that could be in a staged  way and once we have tissue, we can decide what other interventions may  be needed such as chemoradiation.  CT chest did show some multiple small  lung nodules, but the radiologist does not think that was for primary,  but could be anything from simple granulomas to further metastatic  disease, but other radiographic imaging was not helpful as far as  eliciting a primary tumor.   After lots of discussion with the patient and family, it was decided to  proceed with surgery, and we will work at getting this set up.            ______________________________  Clydene Fake, M.D.     JRH/MEDQ  D:  07/29/2008  T:  07/30/2008  Job:  [id number]

## 2011-03-05 NOTE — H&P (Signed)
Matthew Irwin, Matthew Irwin NO.:  [id number]   MEDICAL RECORD NO.:  [id number]          PATIENT TYPE:  INP   LOCATION:  3006                         FACILITY:  [facility]   PHYSICIAN:  Matthew Scott, MD     DATE OF BIRTH:  1956-05-31   DATE OF ADMISSION:  07/29/2008  DATE OF DISCHARGE:                              HISTORY & PHYSICAL   PRIMARY MEDICAL DOCTOR:  [facility] of [facility].   CHIEF COMPLAINTS:  Right sided back and chest pain.   HISTORY OF PRESENTING ILLNESS:  Matthew Irwin is a very pleasant 55 year old  Caucasian male patient with no past medical history.  He was in his  usual state of health until approximately 3 weeks ago.  He started  experiencing the gradual onset of right-sided chest pain, which he  indicates underneath the right breast going across the chest wall to the  back (around T6 level).  This pain has been positional and intermittent.  The pain he says is worse when he lies down or reclines on a reclining  chair.  It is dull, aching and 8/10 at its worst on coughing or  sneezing.  There are positions like sitting up where he does not  experience any pain.  He denies any local rashes.  There is no history  of any weakness, numbness, tingling or burning on his trunk or lower  extremities.  There is no sphincter problems.  The patient denies any  fever, chills or rigors.  For these symptoms, the patient went to his  primary medical doctor, who indicated that this was musculoskeletal pain  and provided him with Motrin and a muscle relaxant.  This did not help  his symptoms.  The patient was then prescribed a steroid Dosepak.  When  he was on the higher doses, the patient says he was pain free, but once  the taper was done his pain returned.  Following this, the patient had  initial CT of the chest, abdomen and pelvis which picked up a mass  lesion in the vertebral column.  This was followed up with an MRI which  showed a  destructive mass lesion in the T6 vertebral body with features  of spinal cord compression and extension above and below.  His case was  discussed with the neurosurgeon on-call, who recommended that the  [facility] admit him, and he would consult on this  patient.   PAST MEDICAL HISTORY:  None.   PAST SURGICAL HISTORY:  1. Sinus surgery.  2. Hemorrhoid surgery.  3. Colonoscopy 5 years ago when two benign polyps were removed.   PSYCHIATRIC HISTORY:  Anxiety disorder.   ALLERGIES:  PENICILLIN - PRURITUS AND HIVES.   MEDICATIONS:  1. Klonopin 0.5 mg p.o. daily.  2. Trazodone 100 mg p.o. nightly.  3. Cymbalta 60 mg p.o. daily.  4. Aspirin 81 mg p.o. daily.  5. Multivitamins 1 p.o. daily.  6. Hydrocodone/acetaminophen 5/500 one to two p.o. q.6 h., p.r.n.   FAMILY HISTORY:  1. Mother age 48 years, alive and has  Parkinson's disease.  2. Father alive and well at 75 years.  3. The patient's sister age 76 years with question of Turner syndrome.   SOCIAL HISTORY:  The patient is married.  He is a Soil scientist.  They  have a daughter.  He quit smoking 10 years ago.  Prior to that, he  smoked for almost 18 years, a pack to a pack and a half per day.  There  is no history of alcohol or drug abuse.   REVIEW OF SYSTEMS:  A comprehensive 14 system review was done and was  noncontributory.   PHYSICAL EXAMINATION:  GENERAL:  Matthew [facility]win is a well-built and nourished  male patient in no obvious distress.  VITAL SIGNS:  Temperature 97.1 degrees Fahrenheit, pulse 88 per minute,  respirations 16 per minute, blood pressure 140/84 mmHg.  Saturating at  96% on room air.  HEENT:  Nontraumatic, normocephalic.  Pupils equally reactive to light  and accommodation.  NECK:  Thick.  Supple.  No JVD, carotid bruit or goiter appreciated.  LYMPHATICS:  No lymphadenopathy.  RESPIRATORY SYSTEM:  Clear to auscultation bilaterally.  CARDIOVASCULAR SYSTEM:  First and second heart sounds  heard.  No  murmurs.  ABDOMEN:  Obese.  Nontender.  No organomegaly or mass appreciated.  Bowel sounds are normally heard.  CENTRAL NERVOUS SYSTEM:  The patient is awake, alert and oriented x4.  No cranial nerve or focal neurological deficits.  EXTREMITIES:  With no cyanosis, clubbing or edema.  Peripheral pulses  are symmetrically felt.  There is no focal deficits in extremities.  SKIN:  Without any rashes.  MUSCULOSKELETAL:  Unremarkable with no spinal tenderness or deformity.   RADIOLOGY:  1. CT CHEST WITH CONTRAST: IMPRESSION:  2. Destructive lesion at T6 and T7.  This may reflect multiple      myeloma, metastatic disease or infection.  Biopsy is suggested.  3. No primary lung tumor is seen.  There are small sub centimeter      lower lobe pulmonary nodules bilaterally.  4. No mediastinal or hilar adenopathy.  5. MRI of the thoracic spine without and with contrast.IMPRESSION:  6. Large osseous and extraosseous mass lesion of T6 with extension to      T5 and T7 and extension into the epidural space.  Significant      spinal cord compression is noted with the spinal cord narrowed to      less than 4 mm.  7. Extension of tumor into the posterior elements at T6 with potential      compromise of the foramen at T5-6 and T6-7.  8. The differential diagnosis includes metastatic disease,      plasmacytoma, or less likely infection.  This is not appear to be a    nerve sheath tumor.  Lymphoma could have this appearance.  1. Additional hemangiomas are identified within the spine.  2. CT of the abdomen with contrast. IMPRESSION:  3. Partially imaged soft tissue mass with adjacent destruction of T6      and medial rib with slight epidural extension of tumor with      indeterminate subcentimeter bilateral lung nodules.  Differential      diagnosis includes plasmacytoma, sarcoma/mesenchymal tumors,      metastatic prostate cancer, thyroid cancer, lymphoma, malignant      nerve sheath tumor.   Mass appears amenable to percutaneous biopsy      for further specificity.  Recommend additional imaging to include      chest x-ray, nuclear  medicine PET-CT as clinically indicated.  4. 3 right and 2 left basilar indeterminate noncalcified subcentimeter      nodules with vascular crowding or 2 cm area of patchy    interstitial pneumonitis at the medial right middle lobe.  1. Incidental slight diffuse fatty infiltration liver and 9 mm left      renal cyst.   LABORATORY DATA:  Being ordered.   ASSESSMENT AND PLAN:  1. Large destructive mass lesion in the T6 vertebral body with      extension to T5 and T7 with associated spinal cord compression.      Currently with no neuro deficits. Etiology/differential diagnosis,      primary versus metastatic cancer, multiple myeloma, infectious      etiology.  Will admit patient to medical floor.  Will request      biopsy by interventional radiology.  Will start IV Decadron.  Will      place on sequential compression devices for deep venous thrombosis      prophylaxis and gastrointestinal prophylaxis with proton pump      inhibitors.  Will also obtain myeloma panel, PSA, ESR and C-      reactive protein.  Neurosurgery, Dr. Phoebe Perch has been consulted.      Oncology, Dr. Darnelle Catalan has been consulted.  2. Right-sided chest and back pain, likely secondary to the      radiculopathy from problem #1.  For pain management.  3. Anxiety disorder.  Continue home medications.  4. Overweight/obesity.  5. Fatty liver  6. Gastrointestinal and deep venous thrombosis prophylaxis.      Matthew Scott, MD  Electronically Signed     AH/MEDQ  D:  07/29/2008  T:  07/29/2008  Job:  [id number]   cc:   Ernestina Penna, M.D.  Clydene Fake, M.D.  Valentino Hue. Magrinat, M.D.

## 2011-03-05 NOTE — Assessment & Plan Note (Signed)
OFFICE VISIT   VELDON, WAGER  DOB:  [birth year]                                        September 28, 2009  CHART #:  [id number]   REASON FOR VISIT:  Follow up after pericardiectomy.   HISTORY OF PRESENT ILLNESS:  The patient is a 55 year old gentleman with  a history of non-Hodgkin's lymphoma.  He was treated with chemotherapy  and radiation and presented with progression of exertional dyspnea.  He  had moderate left ventricular dysfunction, but also had a moderate  pericardial effusion which had become more complex and more constrictive  over time.  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  Postoperatively, he did  well.  He did not have any significant complications and was discharged  home on postoperative day #3.  He says that since he has been home he  has not had to take any pain medication.  He just has  some soreness or  achiness, but not really intense pain.  He still does get short of  breath with exertion, and he is having a bit of a struggle with getting  up from a lying position.  He does have difficulty turning over to avoid  using his arms as part of his sternal precautions.  He had an  echocardiogram done a few days ago which per the patient showed some  improvement in his left ventricular function.  He is not able to pull  that study up in the office currently and do not have an official  report.   PHYSICAL EXAMINATION:  The patient is a 55 year old gentleman, in no  acute distress.  Blood pressure is 103/71, pulse is 110, respirations  are 18, his ox saturation is 95% on room air.  His cardiac exam has a  regular rate and rhythm.  Normal S1 and S2.  He has no rub.  His sternum  is stable.  Sternal incision is intact.  There is no peripheral edema.   DIAGNOSTIC TESTS:  Chest x-ray shows postoperative changes, but  otherwise is unremarkable.   IMPRESSION:  The patient is now about 3  weeks out from a  pericardiectomy.  He seems to be doing well overall.  He is having  minimal pain.  I told him he could take Tylenol or Advil for the  soreness or achiness as needed.  He may begin driving a car on a limited  basis and appropriate precautions were discussed with him.  He is not to  lift any objects or weight greater than 10 pounds for at least another 3  weeks, but overall I think he is doing about as well as it could be  expected and should see significant improvement over the next 3-4 weeks.  He will follow up with Dr. Shirlee Latch.  I would be happy to see him back at  anytime if I could be of any further assistance with his care.   Salvatore Decent Dorris Fetch, M.D.  Electronically Signed   SCH/MEDQ  D:  09/28/2009  T:  09/29/2009  Job:  [id number]   cc:   Rudi Heap, MD  Marca Ancona, MD

## 2011-03-06 ENCOUNTER — Ambulatory Visit (INDEPENDENT_AMBULATORY_CARE_PROVIDER_SITE_OTHER): Payer: 59 | Admitting: Cardiology

## 2011-03-06 ENCOUNTER — Encounter: Payer: Self-pay | Admitting: Cardiology

## 2011-03-06 ENCOUNTER — Ambulatory Visit (HOSPITAL_COMMUNITY): Payer: 59 | Admitting: Oncology

## 2011-03-06 VITALS — BP 114/74 | Resp 18 | Ht 70.0 in | Wt 293.1 lb

## 2011-03-06 DIAGNOSIS — I319 Disease of pericardium, unspecified: Secondary | ICD-10-CM

## 2011-03-06 DIAGNOSIS — I318 Other specified diseases of pericardium: Secondary | ICD-10-CM

## 2011-03-06 DIAGNOSIS — R0989 Other specified symptoms and signs involving the circulatory and respiratory systems: Secondary | ICD-10-CM

## 2011-03-06 DIAGNOSIS — I311 Chronic constrictive pericarditis: Secondary | ICD-10-CM

## 2011-03-06 DIAGNOSIS — R0602 Shortness of breath: Secondary | ICD-10-CM

## 2011-03-06 DIAGNOSIS — R0609 Other forms of dyspnea: Secondary | ICD-10-CM

## 2011-03-06 DIAGNOSIS — E785 Hyperlipidemia, unspecified: Secondary | ICD-10-CM

## 2011-03-06 LAB — BRAIN NATRIURETIC PEPTIDE: Pro B Natriuretic peptide (BNP): 23 pg/mL (ref 0.0–100.0)

## 2011-03-06 NOTE — Assessment & Plan Note (Signed)
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  The probable etiology of this was thoracic radiation.  He is now status post pericardiectomy.  His symptoms have improved.  He still has NYHA II-IIIa dyspnea (IIIb prior).  Still short of breath with inclines.  Followup echo in 6/11 showed EF 50% with improved RV function compared to 12/10.  While there was still respirophasic septal shift, it was less significant than on the prior echo and the IVC was not dilated.   Patient continues to gain weight (another 17 lbs) due to poor diet and no exercise outside of work.  I think that weight gain is also playing a role in his dyspnea.  - Needs more exercise and diet control for weight loss.  I would like to see him lose 10-20 lbs in the next year. - Continue Coreg and losartan at current doses. - He does not appear volume overloaded today so can stay off Lasix.  I will check a BNP.  - Echo in 6/12 to follow for signs of constriction and to reassess LV systolic function.

## 2011-03-06 NOTE — Assessment & Plan Note (Signed)
olerating Crestor.  Lipids followed at Dr. Kathi Der office.

## 2011-03-06 NOTE — Patient Instructions (Signed)
Lab today--BNP 786.09  423.2  Schedule an appointment for an echocardiogram in June 2012.  Your physician wants you to follow-up in: 1 year with Dr Shirlee Latch. (May 2013). You will receive a reminder letter in the mail two months in advance. If you don't receive a letter, please call our office to schedule the follow-up appointment.

## 2011-03-06 NOTE — Progress Notes (Signed)
PCP: Dr. 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  Patient had a repeat echo in 6/11 which showed EF about 50% and septal respirophasic shift that was still present but less prominent than on the prior echo.  The RV looked smaller than prior and the IVC was not dilated.    Patient still gets short of breath walking up an incline or climbing steps, but he is doing well on flat ground or downhill.  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  He does ride a 4-wheeler more now than before his effusive-constrictive pericarditis.   No chest pain.  No orthopnea/PND.  No leg swelling.  He is no longer taking Lasix.  Patient has gained 17 lbs since I last saw him.  Patient can fall asleep easily during the day but his wife says that his snoring at night is relatively mild and she never hears him gasp or stop breathing.     Labs (2/11): creatinine 0.71 Labs (6/11): K 4.1, creatinine 0.9, BNP 75  ECG: NSR, poor anterior R wave progression  Allergies:  1)  ! Penicillin 2)  ! * Simvastatin 3)  ! * Pravastatin  Past Medical History: 1.  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  Chemotherapy, including Adriamycin, completed 3/10.  Radiation to chest/back in early 2010.  PET scan 11/10 with no evidence for recurrent lymphoma.  2.  Chest pain: Left heart cath (10/10) with no angiographic coronary disease.  3.  Cardiomyopathy: Nonischemic.  EF 35-40% with global hypokinesis by LV-gram 10/10.  Cardiac MRI (10/10) was limited by respiratory artifact but showed a small to moderate free-flowing pericardial effusion with no tamponade, EF 43% with global hypokinesis, no myocardial delayed enhancement (no definite evidence of myocardial infarction, infiltrative disease,  or myocarditis). Echo (11/10) with EF 40-45%.  Cardiomyopathy may be secondary to Adriamycin toxicity.  Echo after pericardiectomy in (6/11) showed EF 50%. 4.  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  Had free-flowing effusion in 10/10 with no tamponade.  Patient was reimaged by echo in 11/10 showed EF 40-45% (global HK), dilated IVC, and evidence for ventricular interdependence with an organized pericardial effusion.  Hemodynamic right and left heart cath showed mean RA 14, RV 27/18, PA 29/17, mean PCWP 14, LVEDP 18, cardiac index 1.8 => there was equalization of diastolic pressure.  Also, ventricular interdependence was demonstrated by cath.  Patient underwent pericardiectomy in 11/10.  Echo after pericardiectomy 07-Oct-2023) showed EF 55%, moderate diastolic dysfunction, RV moderately dilated and moderately dysfunctional.  There were still some signs of constrictive physiology (respirophasic interventricular septal variation) but the IVC was small with inspiratory collapse.   Repeat echo 6/11 showed EF 50%, normal RV size and systolic function (improved from 2023-10-07), some septal shift with inspiration but less pronounced than in 2023/10/07, and normal IVC size.  5. Hyperlipidemia: myalgias with pravastatin and simvastatin. 6. Anxiety 7. ACEI cough.  8. Obesity  Family History: No premature CAD Positive for HTN, DM.  Sister with Turner's Syndrome  Social History: married, lives outside of [place].   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   Review of Systems        All systems reviewed and negative except as per  HPI.  Current Outpatient Prescriptions  Medication Sig Dispense Refill  . aspirin 81 MG tablet Take 81 mg by mouth daily.        . carvedilol (COREG) 3.125 MG tablet Take 3.125 mg by mouth 2 (two) times daily with a meal.        . cetirizine (ZYRTEC) 10 MG tablet Take 10 mg by mouth daily.        .  Cholecalciferol (VITAMIN D3 SUPER STRENGTH) 2000 UNITS TABS Take 1 tablet by mouth daily.        . citalopram (CELEXA) 20 MG tablet Take 20 mg by mouth daily.        . clonazePAM (KLONOPIN) 0.5 MG tablet Take 0.5 mg by mouth 2 (two) times daily as needed.        . COZAAR 25 MG tablet TAKE 1 TABLET DAILY  90 each  3  . Multiple Vitamins-Minerals (CENTRUM SILVER ULTRA MENS) TABS Take by mouth.        . Omega-3 Fatty Acids (FISH OIL) 1000 MG CAPS Take 1 capsule by mouth 2 (two) times daily.        . rosuvastatin (CRESTOR) 5 MG tablet Take 5 mg by mouth daily.        . traZODone (DESYREL) 100 MG tablet Take 100 mg by mouth at bedtime.        Marland Kitchen DISCONTD: enalapril (VASOTEC) 5 MG tablet Take 5 mg by mouth 2 (two) times daily.        Marland Kitchen DISCONTD: furosemide (LASIX) 20 MG tablet Take 20 mg by mouth 2 (two) times daily.        Marland Kitchen DISCONTD: potassium chloride SA (K-DUR,KLOR-CON) 20 MEQ tablet Take 20 mEq by mouth 2 (two) times daily.          Resp 18  Ht 5\' 10"  (1.778 m)  Wt 293 lb 1.3 oz (132.94 kg)  BMI 42.05 kg/m2 General: NAD, obese.  Neck: No JVD, thick neck, no thyromegaly or thyroid nodule.  Lungs: Clear to auscultation bilaterally with normal respiratory effort. CV: Nondisplaced PMI.  Heart regular S1/S2, no S3/S4, no murmur.  No peripheral edema.  No carotid bruit.  Normal pedal pulses.  Abdomen: Soft, nontender, no hepatosplenomegaly, no distention.  Neurologic: Alert and oriented x 3.  Psych: Normal affect.Skin: Intact without lesions or rashes.  Extremities: No clubbing or cyanosis.

## 2011-03-13 ENCOUNTER — Other Ambulatory Visit (HOSPITAL_COMMUNITY): Payer: 59

## 2011-03-21 ENCOUNTER — Other Ambulatory Visit: Payer: Self-pay | Admitting: Cardiology

## 2011-04-01 ENCOUNTER — Ambulatory Visit (HOSPITAL_COMMUNITY): Payer: 59 | Attending: Cardiology | Admitting: Radiology

## 2011-04-01 DIAGNOSIS — I319 Disease of pericardium, unspecified: Secondary | ICD-10-CM

## 2011-04-01 DIAGNOSIS — I079 Rheumatic tricuspid valve disease, unspecified: Secondary | ICD-10-CM | POA: Insufficient documentation

## 2011-04-01 DIAGNOSIS — I059 Rheumatic mitral valve disease, unspecified: Secondary | ICD-10-CM | POA: Insufficient documentation

## 2011-04-01 DIAGNOSIS — I311 Chronic constrictive pericarditis: Secondary | ICD-10-CM | POA: Insufficient documentation

## 2011-04-08 ENCOUNTER — Encounter (HOSPITAL_COMMUNITY): Payer: 59 | Attending: Oncology

## 2011-04-08 ENCOUNTER — Other Ambulatory Visit (HOSPITAL_COMMUNITY): Payer: Self-pay | Admitting: Oncology

## 2011-04-08 DIAGNOSIS — C8589 Other specified types of non-Hodgkin lymphoma, extranodal and solid organ sites: Secondary | ICD-10-CM | POA: Insufficient documentation

## 2011-04-08 DIAGNOSIS — Z9221 Personal history of antineoplastic chemotherapy: Secondary | ICD-10-CM | POA: Insufficient documentation

## 2011-04-08 DIAGNOSIS — Z452 Encounter for adjustment and management of vascular access device: Secondary | ICD-10-CM

## 2011-04-08 LAB — DIFFERENTIAL
Basophils Absolute: 0 10*3/uL (ref 0.0–0.1)
Basophils Relative: 0 % (ref 0–1)
Eosinophils Absolute: 0.2 10*3/uL (ref 0.0–0.7)
Eosinophils Relative: 4 % (ref 0–5)
Monocytes Absolute: 0.7 10*3/uL (ref 0.1–1.0)

## 2011-04-08 LAB — CBC
HCT: 41.3 % (ref 39.0–52.0)
MCHC: 32.7 g/dL (ref 30.0–36.0)
Platelets: 171 10*3/uL (ref 150–400)
RDW: 14.2 % (ref 11.5–15.5)
WBC: 5.2 10*3/uL (ref 4.0–10.5)

## 2011-04-08 LAB — COMPREHENSIVE METABOLIC PANEL
AST: 30 U/L (ref 0–37)
BUN: 16 mg/dL (ref 6–23)
CO2: 31 mEq/L (ref 19–32)
Calcium: 10 mg/dL (ref 8.4–10.5)
Chloride: 100 mEq/L (ref 96–112)
Creatinine, Ser: 0.79 mg/dL (ref 0.50–1.35)
GFR calc Af Amer: >60 mL/min (ref >60)
GFR calc non Af Amer: >60 mL/min (ref >60)
Glucose, Bld: 84 mg/dL (ref 70–99)
Total Bilirubin: 0.5 mg/dL (ref 0.3–1.2)

## 2011-04-08 LAB — SEDIMENTATION RATE: Sed Rate: 31 mm/hr — ABNORMAL HIGH (ref 0–16)

## 2011-04-08 LAB — LACTATE DEHYDROGENASE: LDH: 140 U/L (ref 94–250)

## 2011-04-10 ENCOUNTER — Ambulatory Visit (HOSPITAL_COMMUNITY): Payer: 59 | Admitting: Oncology

## 2011-04-12 ENCOUNTER — Encounter (HOSPITAL_COMMUNITY): Payer: 59

## 2011-04-12 DIAGNOSIS — C8589 Other specified types of non-Hodgkin lymphoma, extranodal and solid organ sites: Secondary | ICD-10-CM

## 2011-05-20 ENCOUNTER — Encounter (HOSPITAL_COMMUNITY): Payer: 59 | Attending: Oncology

## 2011-05-20 DIAGNOSIS — C8589 Other specified types of non-Hodgkin lymphoma, extranodal and solid organ sites: Secondary | ICD-10-CM | POA: Insufficient documentation

## 2011-05-20 DIAGNOSIS — C8590 Non-Hodgkin lymphoma, unspecified, unspecified site: Secondary | ICD-10-CM

## 2011-05-20 DIAGNOSIS — Z452 Encounter for adjustment and management of vascular access device: Secondary | ICD-10-CM

## 2011-05-20 MED ORDER — HEPARIN SOD (PORK) LOCK FLUSH 100 UNIT/ML IV SOLN
500.0000 [IU] | Freq: Once | INTRAVENOUS | Status: AC
  Administered 2011-05-20: 500 [IU] via INTRAVENOUS

## 2011-05-20 MED ORDER — SODIUM CHLORIDE 0.9 % IJ SOLN
INTRAMUSCULAR | Status: AC
  Administered 2011-05-20: 10 mL via INTRAVENOUS
  Filled 2011-05-20: qty 20

## 2011-05-20 MED ORDER — HEPARIN SOD (PORK) LOCK FLUSH 100 UNIT/ML IV SOLN
INTRAVENOUS | Status: AC
  Administered 2011-05-20: 500 [IU] via INTRAVENOUS
  Filled 2011-05-20: qty 5

## 2011-05-20 MED ORDER — SODIUM CHLORIDE 0.9 % IJ SOLN
10.0000 mL | Freq: Once | INTRAMUSCULAR | Status: AC
  Administered 2011-05-20: 10 mL via INTRAVENOUS

## 2011-05-20 NOTE — Progress Notes (Signed)
[facility] presented for Portacath access and flush. Proper placement of portacath confirmed by CXR. Portacath located rt chest wall accessed with  H 20 needle. Good blood return present. Portacath flushed with 20ml NS and 500U/35ml Heparin and needle removed intact. Procedure without incident. Patient tolerated procedure well.

## 2011-07-01 ENCOUNTER — Encounter (HOSPITAL_COMMUNITY): Payer: 59 | Attending: Oncology

## 2011-07-01 DIAGNOSIS — Z452 Encounter for adjustment and management of vascular access device: Secondary | ICD-10-CM

## 2011-07-01 DIAGNOSIS — C8589 Other specified types of non-Hodgkin lymphoma, extranodal and solid organ sites: Secondary | ICD-10-CM | POA: Insufficient documentation

## 2011-07-01 MED ORDER — HEPARIN SOD (PORK) LOCK FLUSH 100 UNIT/ML IV SOLN
INTRAVENOUS | Status: AC
  Administered 2011-07-01: 500 [IU] via INTRAVENOUS
  Filled 2011-07-01: qty 5

## 2011-07-01 MED ORDER — SODIUM CHLORIDE 0.9 % IJ SOLN
INTRAMUSCULAR | Status: AC
  Administered 2011-07-01: 10 mL via INTRAVENOUS
  Filled 2011-07-01: qty 10

## 2011-07-01 MED ORDER — HEPARIN SOD (PORK) LOCK FLUSH 100 UNIT/ML IV SOLN
500.0000 [IU] | Freq: Once | INTRAVENOUS | Status: AC
  Administered 2011-07-01: 500 [IU] via INTRAVENOUS
  Filled 2011-07-01: qty 5

## 2011-07-01 MED ORDER — SODIUM CHLORIDE 0.9 % IJ SOLN
10.0000 mL | INTRAMUSCULAR | Status: DC | PRN
  Administered 2011-07-01: 10 mL via INTRAVENOUS
  Filled 2011-07-01: qty 10

## 2011-07-01 NOTE — Progress Notes (Signed)
Ollie E Flott presented for Portacath access and flush. Proper placement of portacath confirmed by CXR. Portacath located left chest wall accessed with  H 20 needle. Good blood return present. Portacath flushed with 20ml NS and 500U/5ml Heparin and needle removed intact. Procedure without incident. Patient tolerated procedure well.   

## 2011-07-22 LAB — POCT I-STAT 4, (NA,K, GLUC, HGB,HCT)
Glucose, Bld: 138 — ABNORMAL HIGH
HCT: 27 — ABNORMAL LOW
HCT: 36 — ABNORMAL LOW
Hemoglobin: 9.2 — ABNORMAL LOW
Potassium: 4.3
Sodium: 135
Sodium: 136

## 2011-07-22 LAB — CROSSMATCH
ABO/RH(D): B POS
Antibody Screen: NEGATIVE

## 2011-07-22 LAB — DIFFERENTIAL
Basophils Relative: 0
Eosinophils Absolute: 0.3
Monocytes Absolute: 0.8
Monocytes Relative: 14 — ABNORMAL HIGH

## 2011-07-22 LAB — MULTIPLE MYELOMA PANEL, SERUM
Alpha-1-Globulin: 3.7
Alpha-2-Globulin: 10.5
Beta 2: 3.7
Gamma Globulin: 20.5 — ABNORMAL HIGH
M-Spike, %: NOT DETECTED

## 2011-07-22 LAB — CBC
HCT: 32.9 — ABNORMAL LOW
HCT: 44.7
Hemoglobin: 15.1
MCHC: 33.8
MCV: 90.6
MCV: 91.4
Platelets: 139 — ABNORMAL LOW
RBC: 3.61 — ABNORMAL LOW
RDW: 13.4
WBC: 11.6 — ABNORMAL HIGH
WBC: 8.7

## 2011-07-22 LAB — URINALYSIS, ROUTINE W REFLEX MICROSCOPIC
Glucose, UA: NEGATIVE
Hgb urine dipstick: NEGATIVE
Ketones, ur: NEGATIVE
pH: 6.5

## 2011-07-22 LAB — PROTIME-INR: INR: 1

## 2011-07-22 LAB — PSA: PSA: 0.26

## 2011-07-22 LAB — APTT: aPTT: 27

## 2011-07-22 LAB — BASIC METABOLIC PANEL
BUN: 14
Calcium: 7.9 — ABNORMAL LOW
Chloride: 107
Creatinine, Ser: 0.73
Creatinine, Ser: 0.87
GFR calc Af Amer: >60
GFR calc non Af Amer: >60
Potassium: 3.8

## 2011-07-22 LAB — COMPREHENSIVE METABOLIC PANEL
ALT: 61 — ABNORMAL HIGH
Albumin: 4.3
Calcium: 9.3
GFR calc Af Amer: >60
Glucose, Bld: 105 — ABNORMAL HIGH
Sodium: 135
Total Protein: 7.8

## 2011-07-22 LAB — MAGNESIUM: Magnesium: 2.4

## 2011-07-22 LAB — ABO/RH: ABO/RH(D): B POS

## 2011-07-22 LAB — PHOSPHORUS: Phosphorus: 3.4

## 2011-07-22 LAB — TSH: TSH: 2.73

## 2011-07-22 LAB — LIPID PANEL: VLDL: 52 — ABNORMAL HIGH

## 2011-07-23 LAB — CBC
HCT: 38.9 % — ABNORMAL LOW (ref 39.0–52.0)
Hemoglobin: 12.6 g/dL — ABNORMAL LOW (ref 13.0–17.0)
Hemoglobin: 13.1 g/dL (ref 13.0–17.0)
Hemoglobin: 12.3 g/dL — ABNORMAL LOW (ref 13.0–17.0)
MCHC: 34 g/dL (ref 30.0–36.0)
MCV: 85.6 fL (ref 78.0–100.0)
RBC: 4.29 MIL/uL (ref 4.22–5.81)
RBC: 4.16 MIL/uL — ABNORMAL LOW (ref 4.22–5.81)
RDW: 13.9 % (ref 11.5–15.5)
WBC: 6.5 10*3/uL (ref 4.0–10.5)
WBC: 5.3 10*3/uL (ref 4.0–10.5)

## 2011-07-23 LAB — BETA 2 MICROGLOBULIN, SERUM: Beta-2 Microglobulin: 2.83 mg/L — ABNORMAL HIGH (ref 1.01–1.73)

## 2011-07-23 LAB — DIFFERENTIAL
Basophils Absolute: 0 10*3/uL (ref 0.0–0.1)
Basophils Absolute: 0 10*3/uL (ref 0.0–0.1)
Basophils Relative: 0 % (ref 0–1)
Basophils Relative: 0 % (ref 0–1)
Eosinophils Absolute: 0.2 10*3/uL (ref 0.0–0.7)
Eosinophils Relative: 4 % (ref 0–5)
Lymphocytes Relative: 21 % (ref 12–46)
Lymphs Abs: 1.4 10*3/uL (ref 0.7–4.0)
Monocytes Absolute: 0.8 10*3/uL (ref 0.1–1.0)
Monocytes Relative: 12 % (ref 3–12)
Monocytes Relative: 12 % (ref 3–12)
Neutro Abs: 3.7 10*3/uL (ref 1.7–7.7)
Neutro Abs: 3.8 10*3/uL (ref 1.7–7.7)
Neutrophils Relative %: 58 % (ref 43–77)
Neutrophils Relative %: 59 % (ref 43–77)
Neutrophils Relative %: 83 % — ABNORMAL HIGH (ref 43–77)

## 2011-07-23 LAB — COMPREHENSIVE METABOLIC PANEL
ALT: 49 U/L (ref 0–53)
AST: 46 U/L — ABNORMAL HIGH (ref 0–37)
Alkaline Phosphatase: 101 U/L (ref 39–117)
BUN: 12 mg/dL (ref 6–23)
CO2: 27 mEq/L (ref 19–32)
CO2: 27 mEq/L (ref 19–32)
Chloride: 101 mEq/L (ref 96–112)
Creatinine, Ser: 0.76 mg/dL (ref 0.4–1.5)
GFR calc Af Amer: >60 mL/min (ref >60)
GFR calc non Af Amer: >60 mL/min (ref >60)
GFR calc non Af Amer: >60 mL/min (ref >60)
Glucose, Bld: 93 mg/dL (ref 70–99)
Glucose, Bld: 146 mg/dL — ABNORMAL HIGH (ref 70–99)
Potassium: 3.9 mEq/L (ref 3.5–5.1)
Total Bilirubin: 0.7 mg/dL (ref 0.3–1.2)
Total Bilirubin: 0.5 mg/dL (ref 0.3–1.2)
Total Protein: 8.2 g/dL (ref 6.0–8.3)

## 2011-07-23 LAB — HEPATITIS B SURFACE ANTIBODY,QUALITATIVE: Hep B S Ab: NEGATIVE

## 2011-07-23 LAB — LACTATE DEHYDROGENASE
LDH: 127 U/L (ref 94–250)
LDH: 127 U/L (ref 94–250)

## 2011-07-23 LAB — SEDIMENTATION RATE: Sed Rate: 71 mm/hr — ABNORMAL HIGH (ref 0–16)

## 2011-07-26 LAB — CULTURE, BLOOD (ROUTINE X 2)

## 2011-07-26 LAB — URINALYSIS, ROUTINE W REFLEX MICROSCOPIC
Bilirubin Urine: NEGATIVE
Nitrite: NEGATIVE
Urobilinogen, UA: 0.2 mg/dL (ref 0.0–1.0)
pH: 7 (ref 5.0–8.0)

## 2011-07-26 LAB — DIFFERENTIAL
Basophils Absolute: 0 10*3/uL (ref 0.0–0.1)
Basophils Absolute: 0 10*3/uL (ref 0.0–0.1)
Basophils Relative: 0 % (ref 0–1)
Basophils Relative: 2 % — ABNORMAL HIGH (ref 0–1)
Eosinophils Relative: 1 % (ref 0–5)
Eosinophils Relative: 4 % (ref 0–5)
Lymphocytes Relative: 18 % (ref 12–46)
Lymphocytes Relative: 26 % (ref 12–46)
Lymphs Abs: 0.5 10*3/uL — ABNORMAL LOW (ref 0.7–4.0)
Monocytes Absolute: 0.7 10*3/uL (ref 0.1–1.0)
Monocytes Absolute: 1 10*3/uL (ref 0.1–1.0)
Monocytes Absolute: 1.9 10*3/uL — ABNORMAL HIGH (ref 0.1–1.0)
Neutro Abs: 0.6 10*3/uL — ABNORMAL LOW (ref 1.7–7.7)
Neutro Abs: 1.3 10*3/uL — ABNORMAL LOW (ref 1.7–7.7)
Neutro Abs: 1.7 10*3/uL (ref 1.7–7.7)

## 2011-07-26 LAB — COMPREHENSIVE METABOLIC PANEL
ALT: 65 U/L — ABNORMAL HIGH (ref 0–53)
AST: 52 U/L — ABNORMAL HIGH (ref 0–37)
Alkaline Phosphatase: 74 U/L (ref 39–117)
BUN: 9 mg/dL (ref 6–23)
CO2: 25 mEq/L (ref 19–32)
Calcium: 9.6 mg/dL (ref 8.4–10.5)
Chloride: 99 mEq/L (ref 96–112)
Creatinine, Ser: 0.78 mg/dL (ref 0.4–1.5)
Creatinine, Ser: 0.9 mg/dL (ref 0.4–1.5)
GFR calc Af Amer: >60 mL/min (ref >60)
GFR calc non Af Amer: >60 mL/min (ref >60)
Glucose, Bld: 157 mg/dL — ABNORMAL HIGH (ref 70–99)
Glucose, Bld: 82 mg/dL (ref 70–99)
Total Bilirubin: 0.8 mg/dL (ref 0.3–1.2)
Total Protein: 7.1 g/dL (ref 6.0–8.3)

## 2011-07-26 LAB — CBC
HCT: 34.9 % — ABNORMAL LOW (ref 39.0–52.0)
HCT: 38.4 % — ABNORMAL LOW (ref 39.0–52.0)
Hemoglobin: 11.9 g/dL — ABNORMAL LOW (ref 13.0–17.0)
Hemoglobin: 12.4 g/dL — ABNORMAL LOW (ref 13.0–17.0)
Hemoglobin: 12.9 g/dL — ABNORMAL LOW (ref 13.0–17.0)
Hemoglobin: 13.1 g/dL (ref 13.0–17.0)
MCHC: 34.1 g/dL (ref 30.0–36.0)
MCV: 83.5 fL (ref 78.0–100.0)
MCV: 83.7 fL (ref 78.0–100.0)
MCV: 84.6 fL (ref 78.0–100.0)
RBC: 4.18 MIL/uL — ABNORMAL LOW (ref 4.22–5.81)
RBC: 4.37 MIL/uL (ref 4.22–5.81)
RBC: 4.57 MIL/uL (ref 4.22–5.81)
RDW: 15.1 % (ref 11.5–15.5)
RDW: 15.9 % — ABNORMAL HIGH (ref 11.5–15.5)
WBC: 1.9 10*3/uL — ABNORMAL LOW (ref 4.0–10.5)
WBC: 4.3 10*3/uL (ref 4.0–10.5)

## 2011-07-26 LAB — BASIC METABOLIC PANEL
BUN: 9 mg/dL (ref 6–23)
Chloride: 104 mEq/L (ref 96–112)
Potassium: 3.8 mEq/L (ref 3.5–5.1)

## 2011-07-26 LAB — GLUCOSE, CAPILLARY: Glucose-Capillary: 102 mg/dL — ABNORMAL HIGH (ref 70–99)

## 2011-07-26 LAB — MAGNESIUM: Magnesium: 2.4 mg/dL (ref 1.5–2.5)

## 2011-07-26 LAB — LACTATE DEHYDROGENASE: LDH: 128 U/L (ref 94–250)

## 2011-08-12 ENCOUNTER — Encounter (HOSPITAL_COMMUNITY): Payer: 59 | Attending: Oncology

## 2011-08-12 DIAGNOSIS — C8589 Other specified types of non-Hodgkin lymphoma, extranodal and solid organ sites: Secondary | ICD-10-CM | POA: Insufficient documentation

## 2011-08-12 DIAGNOSIS — Z452 Encounter for adjustment and management of vascular access device: Secondary | ICD-10-CM

## 2011-08-12 DIAGNOSIS — E669 Obesity, unspecified: Secondary | ICD-10-CM | POA: Insufficient documentation

## 2011-08-12 LAB — DIFFERENTIAL
Basophils Absolute: 0 10*3/uL (ref 0.0–0.1)
Basophils Relative: 0 % (ref 0–1)
Eosinophils Absolute: 0.3 10*3/uL (ref 0.0–0.7)
Eosinophils Relative: 5 % (ref 0–5)
Monocytes Absolute: 0.6 10*3/uL (ref 0.1–1.0)
Monocytes Relative: 11 % (ref 3–12)

## 2011-08-12 LAB — COMPREHENSIVE METABOLIC PANEL
AST: 31 U/L (ref 0–37)
Albumin: 3.4 g/dL — ABNORMAL LOW (ref 3.5–5.2)
BUN: 12 mg/dL (ref 6–23)
Calcium: 9.5 mg/dL (ref 8.4–10.5)
Chloride: 102 mEq/L (ref 96–112)
Creatinine, Ser: 0.91 mg/dL (ref 0.50–1.35)
Total Bilirubin: 0.4 mg/dL (ref 0.3–1.2)
Total Protein: 6.9 g/dL (ref 6.0–8.3)

## 2011-08-12 LAB — CBC
HCT: 37.3 % — ABNORMAL LOW (ref 39.0–52.0)
Hemoglobin: 12.1 g/dL — ABNORMAL LOW (ref 13.0–17.0)
MCH: 28.6 pg (ref 26.0–34.0)
MCHC: 32.4 g/dL (ref 30.0–36.0)
RDW: 14 % (ref 11.5–15.5)

## 2011-08-12 LAB — SEDIMENTATION RATE: Sed Rate: 36 mm/hr — ABNORMAL HIGH (ref 0–16)

## 2011-08-12 LAB — LACTATE DEHYDROGENASE: LDH: 148 U/L (ref 94–250)

## 2011-08-12 MED ORDER — SODIUM CHLORIDE 0.9 % IJ SOLN
INTRAMUSCULAR | Status: AC
  Administered 2011-08-12: 10 mL via INTRAVENOUS
  Filled 2011-08-12: qty 10

## 2011-08-12 MED ORDER — HEPARIN SOD (PORK) LOCK FLUSH 100 UNIT/ML IV SOLN
500.0000 [IU] | Freq: Once | INTRAVENOUS | Status: AC
  Administered 2011-08-12: 500 [IU] via INTRAVENOUS
  Filled 2011-08-12: qty 5

## 2011-08-12 MED ORDER — HEPARIN SOD (PORK) LOCK FLUSH 100 UNIT/ML IV SOLN
INTRAVENOUS | Status: AC
  Administered 2011-08-12: 500 [IU] via INTRAVENOUS
  Filled 2011-08-12: qty 5

## 2011-08-12 MED ORDER — SODIUM CHLORIDE 0.9 % IJ SOLN
10.0000 mL | Freq: Once | INTRAMUSCULAR | Status: AC
  Administered 2011-08-12: 10 mL via INTRAVENOUS
  Filled 2011-08-12: qty 10

## 2011-08-12 NOTE — Progress Notes (Signed)
[facility] presented for Portacath access and flush.  Portacath located lt chest wall accessed with  H 20 needle. Good blood return present. Portacath flushed with 20ml NS and 500U/45ml Heparin and needle removed intact.Specimen to lab for cbc diff, cmet, esr, and ldh.  Procedure without incident. Patient tolerated procedure well.

## 2011-08-16 ENCOUNTER — Encounter (HOSPITAL_COMMUNITY): Payer: Self-pay | Admitting: Oncology

## 2011-08-16 ENCOUNTER — Encounter (HOSPITAL_BASED_OUTPATIENT_CLINIC_OR_DEPARTMENT_OTHER): Payer: 59 | Admitting: Oncology

## 2011-08-16 DIAGNOSIS — E669 Obesity, unspecified: Secondary | ICD-10-CM

## 2011-08-16 DIAGNOSIS — R0602 Shortness of breath: Secondary | ICD-10-CM

## 2011-08-16 DIAGNOSIS — R635 Abnormal weight gain: Secondary | ICD-10-CM

## 2011-08-16 DIAGNOSIS — C8589 Other specified types of non-Hodgkin lymphoma, extranodal and solid organ sites: Secondary | ICD-10-CM

## 2011-08-16 HISTORY — DX: Obesity, unspecified: E66.9

## 2011-08-16 NOTE — Progress Notes (Signed)
Matthew Becton, MD, MD [address] [place] [address]  1. LARGE CELL LYMPHOMA UNS SITE EXTRANODL&SOLID ORG  furosemide (LASIX) 20 MG tablet, potassium chloride SA (K-DUR,KLOR-CON) 20 MEQ tablet, CBC, Differential, Basic metabolic panel, Lactate dehydrogenase, Sedimentation rate  2. Obesity  Amb ref  Medical Nutrition Therapy (MNT)  3. SOB (shortness of breath) on exertion    4. Weight gain      CURRENT THERAPY:  S/P excision of large part of tumor that was pressing on spinal cord.  S/P 6 cycles R-CHOP followed by involve field radiation.  S/P Intrathecal chemotherapy on during cycles 3, 4, 5, 6.      INTERVAL HISTORY: Matthew Irwin 55 y.o. 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  He reports shortness of breath with exertion, poor stamina, and difficulty going up [place].  He reports that this began following chemotherapy.  He had hoped that it would resolved and/or improve, but it simply has not.   The patient reports fatigue following 5 hours of working, but he continues to force himself to finish the work day.    On further questioning, the patient reports that a poor diet.  He explains that he goes to [employer] for lunch breaks because it is "healthy."  It is clear that the patient needs some education regarding healthy eating choices.  Therefore, we will refer him to a nutritionist.  We had a long discussion about healthier eating, but I will defer the details to the nutritionist.  I believe a good weight goal for this patient is 250 lbs due to his body make-up.   Since completing chemotherapy, the patient has gained 10 pounds.  I told him this.  I explained that lugging 10 more pounds around on his body is significant and taxing.  This may be a direct cause of his poor stamina and difficulty with uphill climbs.    Due to the patient's poor stamina, he asks if  he could apply for disability.  He explains that working a full work day is difficult for him to complete.  I explained that from the oncology standpoint, it will be difficult to justify disability, but he can certainly make an attempt.  The patient had stage 1 disease and is a number of years out from treatment.  He is clearly in remission.  He denies any complaints.  He has not noted any new lumps or bumps.  He denies any fevers, chills, night sweats, weight loss, and decreased appetite.  He denies any bowel or urinary complaints.  Past Medical History  Diagnosis Date  . Lymphoma   . Chest pain     left heart catherization  . History of cardiac catheterization   . Cardiomyopathy   . Subacute effusive constrictive pericarditis   . Hyperlipidemia     myalgias with pravastatin and simvastatin.  Marland Kitchen Anxiety   . ACEI/ARB contraindicated   . CHF (congestive heart failure)   . Obesity 08/16/2011    has Diffuse large B cell lymphoma; HYPERLIPIDEMIA-MIXED; CONSTRICTIVE PERICARDITIS; PERICARDIAL EFFUSION; CONGESTIVE HEART FAILURE, UNSPECIFIED; GAD (generalized anxiety disorder); Insomnia; and Obesity on his problem list.     is allergic to penicillins and simvastatin.  Matthew Irwin does not currently have medications on file.  Past Surgical History  Procedure Date  . Cardiac catheterization 10/10  . Removal of tumor (t6)   .  Nasal sinus surgery   . Hemorrhoid surgery     Denies any headaches, dizziness, double vision, fevers, chills, night sweats, nausea, vomiting, diarrhea, constipation, chest pain, heart palpitations, shortness of breath, blood in stool, black tarry stool, urinary pain, urinary burning, urinary frequency, hematuria.   PHYSICAL EXAMINATION  ECOG PERFORMANCE STATUS: 0 - Asymptomatic  Filed Vitals:   08/16/11 1214  BP: 119/78  Pulse: 59  Temp: 97.5 F (36.4 C)    GENERAL:alert, no distress, well nourished, well developed, comfortable, cooperative, obese and  smiling SKIN: skin color, texture, turgor are normal HEAD: Normocephalic EYES: normal EARS: External ears normal OROPHARYNX:mucous membranes are moist  NECK: supple, no adenopathy, no bruits, no JVD, thyroid normal size, non-tender, without nodularity, no stridor, non-tender, trachea midline LYMPH:  no palpable lymphadenopathy, no hepatosplenomegaly BREAST:not examined LUNGS: clear to auscultation and percussion HEART: regular rate & rhythm, no murmurs, no gallops, S1 normal and S2 normal ABDOMEN:abdomen soft, non-tender, obese, normal bowel sounds and no hepatosplenomegaly BACK: Back symmetric, no curvature., No CVA tenderness EXTREMITIES:less then 2 second capillary refill, no joint deformities, effusion, or inflammation, no edema, no skin discoloration, no clubbing, no cyanosis  NEURO: alert & oriented x 3 with fluent speech, no focal motor/sensory deficits, gait normal   LABORATORY DATA: CBC    Component Value Date/Time   WBC 5.6 08/12/2011 1549   RBC 4.23 08/12/2011 1549   HGB 12.1* 08/12/2011 1549   HCT 37.3* 08/12/2011 1549   PLT 199 08/12/2011 1549   MCV 88.2 08/12/2011 1549   MCH 28.6 08/12/2011 1549   MCHC 32.4 08/12/2011 1549   RDW 14.0 08/12/2011 1549   LYMPHSABS 1.1 08/12/2011 1549   MONOABS 0.6 08/12/2011 1549   EOSABS 0.3 08/12/2011 1549   BASOSABS 0.0 08/12/2011 1549      Chemistry      Component Value Date/Time   NA 139 08/12/2011 1549   K 3.7 08/12/2011 1549   CL 102 08/12/2011 1549   CO2 29 08/12/2011 1549   BUN 12 08/12/2011 1549   CREATININE 0.91 08/12/2011 1549      Component Value Date/Time   CALCIUM 9.5 08/12/2011 1549   ALKPHOS 101 08/12/2011 1549   AST 31 08/12/2011 1549   ALT 31 08/12/2011 1549   BILITOT 0.4 08/12/2011 1549        ASSESSMENT:  1. Stage IE Diffuse large B cell Lymphoma. S/P excision of large part of tumor that was pressing on spinal cord.  S/P 6 cycles R-CHOP followed by involve field radiation.  S/P Intrathecal  chemotherapy on during cycles 3, 4, 5, 6.   2. Poor stamina 3. Weight gain 4. Obesity 5. SOB with exertion   PLAN:  1. I personally reviewed and went over laboratory results with the patient. 2. Referral to nutritionist for healthy eating choice and weight loss management. 3. Lab work in 4 months: CBC diff, BMET/CMET, LDH, ESR 4. Return in 4 months for follow-up. 5. Weight loss is important for this patient's health going forward.   All questions were answered. The patient knows to call the clinic with any problems, questions or concerns. We can certainly see the patient much sooner if necessary.  More than 50% of the time spent with the patient was utilized for counseling.  KEFALAS,THOMAS

## 2011-08-16 NOTE — Patient Instructions (Signed)
[facility]  Discharge Instructions  RECOMMENDATIONS MADE BY THE CONSULTANT AND ANY TEST RESULTS WILL BE SENT TO YOUR REFERRING DOCTOR.   EXAM FINDINGS BY MD TODAY AND SIGNS AND SYMPTOMS TO REPORT TO CLINIC OR PRIMARY MD: Elijah Birk will put you in for a nutrition consult for weight loss/management.   INSTRUCTIONS GIVEN AND DISCUSSED: Let us know if you don't hear from the Dietician within the next week for your appointment.   SPECIAL INSTRUCTIONS/FOLLOW-UP: Return to clinic in 4 months for lab work and then to see the MD.    I acknowledge that I have been informed and understand all the instructions given to me and received a copy. I do not have any more questions at this time, but understand that I may call the Specialty Clinic at [facility] at [phone] during business hours should I have any further questions or need assistance in obtaining follow-up care.    __________________________________________  _____________  __________ Signature of Patient or Authorized Representative            Date                   Time    __________________________________________ Nurse's Signature

## 2011-09-16 ENCOUNTER — Encounter (HOSPITAL_COMMUNITY): Payer: 59 | Attending: Oncology

## 2011-09-16 DIAGNOSIS — C8589 Other specified types of non-Hodgkin lymphoma, extranodal and solid organ sites: Secondary | ICD-10-CM

## 2011-09-16 DIAGNOSIS — I878 Other specified disorders of veins: Secondary | ICD-10-CM

## 2011-09-16 DIAGNOSIS — Z452 Encounter for adjustment and management of vascular access device: Secondary | ICD-10-CM

## 2011-09-16 DIAGNOSIS — I998 Other disorder of circulatory system: Secondary | ICD-10-CM | POA: Insufficient documentation

## 2011-09-16 MED ORDER — SODIUM CHLORIDE 0.9 % IJ SOLN
10.0000 mL | INTRAMUSCULAR | Status: DC | PRN
  Administered 2011-09-16: 10 mL via INTRAVENOUS
  Filled 2011-09-16: qty 10

## 2011-09-16 MED ORDER — HEPARIN SOD (PORK) LOCK FLUSH 100 UNIT/ML IV SOLN
500.0000 [IU] | Freq: Once | INTRAVENOUS | Status: AC
  Administered 2011-09-16: 500 [IU] via INTRAVENOUS
  Filled 2011-09-16: qty 5

## 2011-09-16 NOTE — Progress Notes (Signed)
Saahas E Tygart presented for Portacath access and flush. Proper placement of portacath confirmed by CXR. Portacath located left chest wall accessed with  H 20 needle. Good blood return present. Portacath flushed with 20ml NS and 500U/5ml Heparin and needle removed intact. Procedure without incident. Patient tolerated procedure well.   

## 2011-10-28 ENCOUNTER — Encounter (HOSPITAL_COMMUNITY): Payer: 59

## 2011-11-12 ENCOUNTER — Other Ambulatory Visit: Payer: Self-pay | Admitting: Cardiology

## 2011-12-17 ENCOUNTER — Encounter (HOSPITAL_COMMUNITY): Payer: 59 | Attending: Oncology

## 2011-12-17 DIAGNOSIS — C8589 Other specified types of non-Hodgkin lymphoma, extranodal and solid organ sites: Secondary | ICD-10-CM

## 2011-12-17 DIAGNOSIS — C8590 Non-Hodgkin lymphoma, unspecified, unspecified site: Secondary | ICD-10-CM

## 2011-12-17 LAB — DIFFERENTIAL
Basophils Absolute: 0 10*3/uL (ref 0.0–0.1)
Basophils Relative: 0 % (ref 0–1)
Lymphocytes Relative: 20 % (ref 12–46)
Monocytes Relative: 11 % (ref 3–12)
Neutro Abs: 3.8 10*3/uL (ref 1.7–7.7)
Neutrophils Relative %: 65 % (ref 43–77)

## 2011-12-17 LAB — BASIC METABOLIC PANEL
BUN: 14 mg/dL (ref 6–23)
CO2: 29 mEq/L (ref 19–32)
Chloride: 101 mEq/L (ref 96–112)
GFR calc Af Amer: >90 mL/min (ref >90)
Potassium: 3.9 mEq/L (ref 3.5–5.1)

## 2011-12-17 LAB — CBC
HCT: 40.3 % (ref 39.0–52.0)
Hemoglobin: 13.2 g/dL (ref 13.0–17.0)
MCH: 28.1 pg (ref 26.0–34.0)
MCHC: 32.8 g/dL (ref 30.0–36.0)
MCV: 85.7 fL (ref 78.0–100.0)
Platelets: 201 K/uL (ref 150–400)
RBC: 4.7 MIL/uL (ref 4.22–5.81)
RDW: 14 % (ref 11.5–15.5)
WBC: 5.9 K/uL (ref 4.0–10.5)

## 2011-12-17 LAB — LACTATE DEHYDROGENASE: LDH: 135 U/L (ref 94–250)

## 2011-12-17 MED ORDER — SODIUM CHLORIDE 0.9 % IJ SOLN
INTRAMUSCULAR | Status: AC
  Administered 2011-12-17: 10 mL via INTRAVENOUS
  Filled 2011-12-17: qty 10

## 2011-12-17 MED ORDER — HEPARIN SOD (PORK) LOCK FLUSH 100 UNIT/ML IV SOLN
INTRAVENOUS | Status: AC
  Administered 2011-12-17: 500 [IU] via INTRAVENOUS
  Filled 2011-12-17: qty 5

## 2011-12-17 MED ORDER — HEPARIN SOD (PORK) LOCK FLUSH 100 UNIT/ML IV SOLN
500.0000 [IU] | Freq: Once | INTRAVENOUS | Status: AC
  Administered 2011-12-17: 500 [IU] via INTRAVENOUS
  Filled 2011-12-17: qty 5

## 2011-12-17 MED ORDER — SODIUM CHLORIDE 0.9 % IJ SOLN
10.0000 mL | INTRAMUSCULAR | Status: DC | PRN
  Administered 2011-12-17: 10 mL via INTRAVENOUS
  Filled 2011-12-17: qty 10

## 2011-12-17 NOTE — Progress Notes (Signed)
Matthew Irwin presented for Portacath access and flush.  Proper placement of portacath confirmed by CXR.  Portacath located left chest wall accessed with  H 20 needle.  Good blood return present; 10cc blood wasted and 10cc obtained for labs. Portacath flushed with 20ml NS and 500U/26ml Heparin and needle removed intact.  Procedure without incident.  Patient tolerated procedure well.

## 2011-12-20 ENCOUNTER — Encounter (HOSPITAL_COMMUNITY): Payer: BC Managed Care – PPO | Attending: Oncology | Admitting: Oncology

## 2011-12-20 ENCOUNTER — Ambulatory Visit (HOSPITAL_COMMUNITY): Payer: 59 | Admitting: Oncology

## 2011-12-20 VITALS — BP 128/75 | HR 80 | Temp 98.0°F | Wt 289.5 lb

## 2011-12-20 DIAGNOSIS — C8330 Diffuse large B-cell lymphoma, unspecified site: Secondary | ICD-10-CM

## 2011-12-20 DIAGNOSIS — C8589 Other specified types of non-Hodgkin lymphoma, extranodal and solid organ sites: Secondary | ICD-10-CM | POA: Insufficient documentation

## 2011-12-20 DIAGNOSIS — I10 Essential (primary) hypertension: Secondary | ICD-10-CM

## 2011-12-20 NOTE — Patient Instructions (Signed)
[facility]  Discharge Instructions Matthew Irwin  [id number] April 15, 1956 RECOMMENDATIONS MADE BY THE CONSULTANT AND ANY TEST RESULTS WILL BE SENT TO YOUR REFERRING DOCTOR.   EXAM FINDINGS BY MD TODAY AND SIGNS AND SYMPTOMS TO REPORT TO CLINIC OR PRIMARY MD: Exam findings as discussed by Dr. Mariel Sleet.  We are working (again) to arrange nutritional counseling for you.  Dr. Mariel Sleet wants to see you back in the office in 4 months.  I acknowledge that I have been informed and understand all the instructions given to me and received a copy. I do not have any more questions at this time, but understand that I may call the Specialty Clinic at [facility] at [phone] during business hours should I have any further questions or need assistance in obtaining follow-up care.    __________________________________________  _____________  __________ Signature of Patient or Authorized Representative            Date                   Time    __________________________________________ Nurse's Signature

## 2011-12-20 NOTE — Progress Notes (Signed)
This office note has been dictated.

## 2011-12-20 NOTE — Progress Notes (Signed)
CC:   Matthew Irwin, M.D. Lionel December, M.D. Lurline Hare, M.D. Salvatore Decent Dorris Fetch, M.D.  DIAGNOSES: 1. Stage IE diffuse large B-cell lymphoma, CD20 positive, occurring at     the T6 vertebral body, status post excision of a large part of this     tumor that was pressing on the spinal cord, followed by 6 cycles of     R-CHOP, followed by involved-field radiation therapy with     intrathecal chemotherapy during cycles 3, 4, 5 and 6 consisting of     methotrexate and Solu-Cortef.  He has achieved a CR and even had a     bone biopsy of T6 post chemotherapy and radiation due to changes     that were seen on an MRI in May 2011, and that was a benign biopsy. 2. Obesity, weighing 289 pounds on a 5 foot 10 inch frame. 3. Prediabetes mellitus with elevated sugars. 4. Depression, for which he is on therapy with Cymbalta with excellent     results. 5. Hypertension, on therapy.  He is accompanied today by his wife.  Hodges, of course, remains very overweight for his height.  I cannot see that there has been any real change for the better for his weight. He thinks he is down 5-7 pounds.  He is not having any problems with his medications.  He is not having any B symptomatology.  His review of systems oncologically is negative.  He is a little tired at times when he is starting out at 5 o'clock in the morning and he is getting there at work at 7:00 a.m.  he is getting back anywhere from 5 to 8 o'clock at night.  PHYSICAL EXAMINATION:  He has no temperature.  He denies any pain.  He is in no acute distress.  His blood pressure 128/75 right arm, sitting position.  Weight 289.5 pounds.  Pulse 80 and regular, respirations 1-18 and unlabored.  He has no obvious lymphadenopathy in the cervical, supraclavicular, infraclavicular, axillary, or inguinal areas.  Lungs: Clear but with mildly diminished breath sounds.  He does have a discolored area on the left mid back just to the left of the  incision a third of the way down, which is slightly pigmented and slightly irregular, and he has seen a dermatologist, who asked him to come back for followup this summer.  He did have a basal cell carcinoma removed from the left shoulder area.  So Bufford Buttner is going to see him back in the near future, but he does not have a copy of his appointment, does not know when his appointment is, so I have asked him to call and get that lined up because it was it looks like February 2012 when she was last evaluating him.  His heart shows a regular rhythm and rate without murmur, rub or gallop. Port-A-Cath is intact, left upper chest wall.  Abdomen:  Obese without obvious organomegaly.  He has no peripheral edema.  So Stella looks good.  His labs do show a mildly-elevated sedimentation rate, which I do not know what to make of.  His LDH is normal.  CBC and differential is normal.  BMET is normal.  Calcium is normal.  So I would just watch this.  I want to see him in 4 months again.  If there is any question about things, we will do another PET scan but so far, I think he has remained disease-free.    ______________________________ Ladona Horns. Mariel Sleet, MD  ESN/MEDQ  D:  12/20/2011  T:  12/20/2011  Job:  [id number]

## 2011-12-23 ENCOUNTER — Telehealth (HOSPITAL_COMMUNITY): Payer: Self-pay | Admitting: Dietician

## 2011-12-23 NOTE — Telephone Encounter (Signed)
Sent letter to pt home via [place] Mail in attempt to contact pt to schedule appointment.  

## 2011-12-23 NOTE — Telephone Encounter (Signed)
Received referral from [facility] (Dr. Mariel Sleet) for dx: pre-diabetes, obesity, and cancer.

## 2011-12-25 NOTE — Telephone Encounter (Signed)
Appointment scheduled for 01/28/12 at 4:00 PM.

## 2012-01-23 ENCOUNTER — Encounter (HOSPITAL_COMMUNITY): Payer: Self-pay | Admitting: Dietician

## 2012-01-23 NOTE — Progress Notes (Signed)
Sent appointment verification letter to pt home via [place] Mail.

## 2012-01-28 ENCOUNTER — Encounter (HOSPITAL_COMMUNITY): Payer: Self-pay | Admitting: Dietician

## 2012-01-28 ENCOUNTER — Encounter (HOSPITAL_COMMUNITY): Payer: BC Managed Care – PPO | Attending: Oncology

## 2012-01-28 DIAGNOSIS — C8330 Diffuse large B-cell lymphoma, unspecified site: Secondary | ICD-10-CM

## 2012-01-28 DIAGNOSIS — C8589 Other specified types of non-Hodgkin lymphoma, extranodal and solid organ sites: Secondary | ICD-10-CM | POA: Insufficient documentation

## 2012-01-28 DIAGNOSIS — Z452 Encounter for adjustment and management of vascular access device: Secondary | ICD-10-CM

## 2012-01-28 MED ORDER — HEPARIN SOD (PORK) LOCK FLUSH 100 UNIT/ML IV SOLN
INTRAVENOUS | Status: AC
  Filled 2012-01-28: qty 5

## 2012-01-28 MED ORDER — SODIUM CHLORIDE 0.9 % IJ SOLN
10.0000 mL | INTRAMUSCULAR | Status: DC | PRN
  Administered 2012-01-28: 10 mL via INTRAVENOUS
  Filled 2012-01-28: qty 10

## 2012-01-28 MED ORDER — HEPARIN SOD (PORK) LOCK FLUSH 100 UNIT/ML IV SOLN
500.0000 [IU] | Freq: Once | INTRAVENOUS | Status: AC
  Administered 2012-01-28: 500 [IU] via INTRAVENOUS
  Filled 2012-01-28: qty 5

## 2012-01-28 MED ORDER — SODIUM CHLORIDE 0.9 % IJ SOLN
INTRAMUSCULAR | Status: AC
  Filled 2012-01-28: qty 10

## 2012-01-28 NOTE — Progress Notes (Signed)
Starling E Marchetti presented for Portacath access and flush. Proper placement of portacath confirmed by CXR. Portacath located left chest wall accessed with  H 20 needle. Good blood return present. Portacath flushed with 20ml NS and 500U/5ml Heparin and needle removed intact. Procedure without incident. Patient tolerated procedure well.   

## 2012-01-29 ENCOUNTER — Encounter (HOSPITAL_COMMUNITY): Payer: Self-pay | Admitting: Dietician

## 2012-01-29 NOTE — Progress Notes (Signed)
Outpatient Initial Nutrition Assessment  Date:01/28/2012   Time: 4:00 PM  Referring Physician: Dr. Mariel Sleet ([facility]) Reason for Visit: obesity  Nutrition Assessment:  Height: 5\' 10"  (177.8 cm)   Weight: 280 lb (127.007 kg)   IBW: 178# %IBW: 157% UBW: 295# %UBW: 95% Body mass index is 40.18 kg/(m^2).  Goal Weight: 252# (10% weight loss) Weight hx: Pt reports his highest weight was 290-295#, "a few months ago". Prior to his weight loss interventions, he reports he had maintained this weight for quite some time. His lowest weight was 180# at age 26. He reports he has lost about 14# since his last visit with Dr. Mariel Sleet about 1.5 months ago.   Estimated nutritional needs: 2595-2831 kcals daily, 102-127 grams protein daily, 2.6-2.8 L fluid daiy   PMH:  Past Medical History  Diagnosis Date  . Lymphoma   . Chest pain     left heart catherization  . History of cardiac catheterization   . Cardiomyopathy   . Subacute effusive constrictive pericarditis   . Hyperlipidemia     myalgias with pravastatin and simvastatin.  Marland Kitchen Anxiety   . ACEI/ARB contraindicated   . CHF (congestive heart failure)   . Obesity 08/16/2011    Medications:  Current Outpatient Rx  Name Route Sig Dispense Refill  . ASPIRIN 81 MG PO TABS Oral Take 81 mg by mouth daily.      Marland Kitchen CETIRIZINE HCL 10 MG PO TABS Oral Take 10 mg by mouth daily.      . CHOLECALCIFEROL 2000 UNITS PO TABS Oral Take 1 tablet by mouth daily.      Marland Kitchen CLONAZEPAM 0.5 MG PO TABS Oral Take 0.5 mg by mouth 2 (two) times daily as needed.      . COREG 3.125 MG PO TABS  TAKE 1 TABLET TWICE DAILY WITH BREAKFAST & SUPPER 60 each 6  . COZAAR 25 MG PO TABS  TAKE 1 TABLET DAILY 90 each 3  . DULOXETINE HCL 60 MG PO CPEP Oral Take 60 mg by mouth daily.    . CENTRUM SILVER ULTRA MENS PO TABS Oral Take by mouth.      Marland Kitchen FISH OIL 1000 MG PO CAPS Oral Take 1 capsule by mouth 2 (two) times daily.      Marland Kitchen ROSUVASTATIN CALCIUM 5 MG PO TABS Oral Take 5 mg  by mouth daily.      . TRAZODONE HCL 100 MG PO TABS Oral Take 100 mg by mouth at bedtime.        Labs: CMP     Component Value Date/Time   NA 137 12/17/2011 0902   K 3.9 12/17/2011 0902   CL 101 12/17/2011 0902   CO2 29 12/17/2011 0902   GLUCOSE 92 12/17/2011 0902   BUN 14 12/17/2011 0902   CREATININE 0.83 12/17/2011 0902   CALCIUM 9.5 12/17/2011 0902   PROT 6.9 08/12/2011 1549   ALBUMIN 3.4* 08/12/2011 1549   AST 31 08/12/2011 1549   ALT 31 08/12/2011 1549   ALKPHOS 101 08/12/2011 1549   BILITOT 0.4 08/12/2011 1549   GFRNONAA >90 12/17/2011 0902   GFRAA >90 12/17/2011 0902    Lipid Panel     Component Value Date/Time   CHOL  Value: 143        ATP III CLASSIFICATION:  <200     mg/dL   Desirable  540-981  mg/dL   Borderline High  >=191    mg/dL   High  08/09/2009 0600   TRIG 54 08/09/2009 0600   HDL 30* 08/09/2009 0600   CHOLHDL 4.8 08/09/2009 0600   VLDL 11 08/09/2009 0600   LDLCALC  Value: 102        Total Cholesterol/HDL:CHD Risk Coronary Heart Disease Risk Table                     Men   Women  1/2 Average Risk   3.4   3.3  Average Risk       5.0   4.4  2 X Average Risk   9.6   7.1  3 X Average Risk  23.4   11.0        Use the calculated Patient Ratio above and the CHD Risk Table to determine the patient's CHD Risk.        ATP III CLASSIFICATION (LDL):  <100     mg/dL   Optimal  478-295  mg/dL   Near or Above                    Optimal  130-159  mg/dL   Borderline  621-308  mg/dL   High  >657     mg/dL   Very High* 84/69/6295 0600     Lab Results  Component Value Date   HGBA1C  Value: 5.9 (NOTE) The ADA recommends the following therapeutic goal for glycemic control related to Hgb A1c measurement: Goal of therapy: <6.5 Hgb A1c  Reference: American Diabetes Association: Clinical Practice Recommendations 2010, Diabetes Care, 2010, 33: (Suppl  1). 09/05/2009   HGBA1C  Value: 5.5 (NOTE) The ADA recommends the following therapeutic goal for glycemic control related to Hgb A1c  measurement: Goal of therapy: <6.5 Hgb A1c  Reference: American Diabetes Association: Clinical Practice Recommendations 2010, Diabetes Care, 2010, 33: (Suppl  1). 08/09/2009   Lab Results  Component Value Date   LDLCALC  Value: 102        Total Cholesterol/HDL:CHD Risk Coronary Heart Disease Risk Table                     Men   Women  1/2 Average Risk   3.4   3.3  Average Risk       5.0   4.4  2 X Average Risk   9.6   7.1  3 X Average Risk  23.4   11.0        Use the calculated Patient Ratio above and the CHD Risk Table to determine the patient's CHD Risk.        ATP III CLASSIFICATION (LDL):  <100     mg/dL   Optimal  284-132  mg/dL   Near or Above                    Optimal  130-159  mg/dL   Borderline  440-102  mg/dL   High  >725     mg/dL   Very High* 36/64/4034   CREATININE 0.83 12/17/2011     Lifestyle/ social habits: Mr. Schappell lives in [place] with his wife, who is present with him today, and his adult children. He works full time as a Soil scientist. He is a former smoker (quit in 1999 with 35 year history). He reports his stress level as a "15", citing work stress and being a "Type A" personality as his major causes of stress.   Nutrition hx/habits: Mr. Leasure reports he has started using the MyFitnessPal  app to help him monitor his caloric intake; he reports he records his intake 2-3 times a week and does not do it daily because he "gets bored". He reports making healthier choices. He has decreased portions, been drinking more water, and cutting back on carbohydrates and junk food. He eats 3 meals per day and has a consistent meal pattern. Most of his meals are eaten out (eat 2-3 meals out daily) and he complains of limited selection of food options due to the lack of restaurants near his job site. He also is concerned that his lunch meat has too much sodium in it. He does not participate in any structured exercise outside of work. He reports that he has never exercised since age 34, when he played  baseball.   Diet recall: Breakfast (7 AM): eggs, piece of sausage, piece of wheat toast; Snack (9:30-10 AM): bottle of water, [employer]; Lunch (11:30-12 PM): 2 hot dogs OR ham or [place] sandwich OR 6" Subway Sub, 1/2 bag potato chips, water or diet soda; Snack (3-4 PM): candy bar, water; Dinner (6 PM): hamburger steak, green beans, corn, water or diet soda, or diet green tea  Nutrition Diagnosis: Excessive energy intake r/t undesirable food choices AEB diet high in refined carbohydrates and sodium, BMI: 40.18.   Nutrition Intervention: Nutrition rx: 2000 kcal NAS, no added sugar diet; 3 meals per day; 1-2 healthy snacks per day; low calorie beverages only; physical activity as tolerated  Education/Counseling Provided: Educated pt on weight loss principles. Discussed plate method. Discussed calorie, carbohydrate, and sodium content in commonly eaten foods and suggested healthier alternatives. Emphasized portion sizes. Praised pt for using MyFitnessPal app, but encouraged daily use. Discussed salt-free alternatives to season food. Discussed importance of consuming low calorie beverages. Also discussed slow, moderate weight loss (102# per week) and how small amount of weight loss (7-10%) can impact health significantly. Provided Nutrition In The Fast Lane and Plate Method handouts.   Understanding, Motivation, Ability to Follow Recommendations: Expect fair to good compliance.   Monitoring and Evaluation: Goals: 1)1-2 weight loss per week; 2) Physical activity as tolerated  Recommendations: 1) For weight loss 2095-2331 kcals daily; 2) Continue to uses MyFitnessPal daily; 3) Order dollar menu or kids meal at restaurants; 4) Physical activity as tolerated  F/U: PRN. Provided RD contact information.   Orlene Plum, [place]  01/28/2012  Time: 4:00 PM

## 2012-02-15 ENCOUNTER — Other Ambulatory Visit: Payer: Self-pay | Admitting: Cardiology

## 2012-03-10 ENCOUNTER — Encounter (HOSPITAL_COMMUNITY): Payer: 59

## 2012-03-12 ENCOUNTER — Encounter (HOSPITAL_COMMUNITY): Payer: BC Managed Care – PPO | Attending: Oncology

## 2012-03-12 DIAGNOSIS — C8330 Diffuse large B-cell lymphoma, unspecified site: Secondary | ICD-10-CM

## 2012-03-12 DIAGNOSIS — C8589 Other specified types of non-Hodgkin lymphoma, extranodal and solid organ sites: Secondary | ICD-10-CM | POA: Insufficient documentation

## 2012-03-12 DIAGNOSIS — Z452 Encounter for adjustment and management of vascular access device: Secondary | ICD-10-CM

## 2012-03-12 MED ORDER — SODIUM CHLORIDE 0.9 % IJ SOLN
10.0000 mL | INTRAMUSCULAR | Status: DC | PRN
  Administered 2012-03-12: 10 mL via INTRAVENOUS
  Filled 2012-03-12: qty 10

## 2012-03-12 MED ORDER — HEPARIN SOD (PORK) LOCK FLUSH 100 UNIT/ML IV SOLN
INTRAVENOUS | Status: AC
  Filled 2012-03-12: qty 5

## 2012-03-12 MED ORDER — SODIUM CHLORIDE 0.9 % IJ SOLN
INTRAMUSCULAR | Status: AC
  Filled 2012-03-12: qty 10

## 2012-03-12 MED ORDER — HEPARIN SOD (PORK) LOCK FLUSH 100 UNIT/ML IV SOLN
500.0000 [IU] | Freq: Once | INTRAVENOUS | Status: AC
  Administered 2012-03-12: 500 [IU] via INTRAVENOUS
  Filled 2012-03-12: qty 5

## 2012-03-12 NOTE — Progress Notes (Signed)
Matthew Irwin presented for Portacath access and flush. Proper placement of portacath confirmed by CXR. Portacath located left chest wall accessed with  H 20 needle. No blood return. Flushes easily without pain or discomfort. Portacath flushed with 20ml NS and 500U/21ml Heparin and needle removed intact. Procedure without incident. Patient tolerated procedure well.

## 2012-04-18 ENCOUNTER — Other Ambulatory Visit: Payer: Self-pay | Admitting: Cardiology

## 2012-04-20 ENCOUNTER — Other Ambulatory Visit (HOSPITAL_COMMUNITY): Payer: 59

## 2012-04-22 ENCOUNTER — Ambulatory Visit (HOSPITAL_COMMUNITY): Payer: 59 | Admitting: Oncology

## 2012-05-04 ENCOUNTER — Encounter (HOSPITAL_COMMUNITY): Payer: 59 | Attending: Oncology

## 2012-05-04 ENCOUNTER — Encounter (HOSPITAL_BASED_OUTPATIENT_CLINIC_OR_DEPARTMENT_OTHER): Payer: 59 | Admitting: Oncology

## 2012-05-04 VITALS — BP 142/81 | HR 77 | Temp 97.7°F | Wt 276.3 lb

## 2012-05-04 DIAGNOSIS — C8330 Diffuse large B-cell lymphoma, unspecified site: Secondary | ICD-10-CM

## 2012-05-04 DIAGNOSIS — C8589 Other specified types of non-Hodgkin lymphoma, extranodal and solid organ sites: Secondary | ICD-10-CM

## 2012-05-04 DIAGNOSIS — F329 Major depressive disorder, single episode, unspecified: Secondary | ICD-10-CM

## 2012-05-04 DIAGNOSIS — F3289 Other specified depressive episodes: Secondary | ICD-10-CM

## 2012-05-04 DIAGNOSIS — L989 Disorder of the skin and subcutaneous tissue, unspecified: Secondary | ICD-10-CM

## 2012-05-04 DIAGNOSIS — E669 Obesity, unspecified: Secondary | ICD-10-CM

## 2012-05-04 LAB — COMPREHENSIVE METABOLIC PANEL
ALT: 24 U/L (ref 0–53)
AST: 25 U/L (ref 0–37)
Albumin: 3.6 g/dL (ref 3.5–5.2)
Calcium: 9.7 mg/dL (ref 8.4–10.5)
Sodium: 139 mEq/L (ref 135–145)
Total Protein: 7.8 g/dL (ref 6.0–8.3)

## 2012-05-04 LAB — DIFFERENTIAL
Basophils Absolute: 0 10*3/uL (ref 0.0–0.1)
Basophils Relative: 0 % (ref 0–1)
Eosinophils Absolute: 0.2 10*3/uL (ref 0.0–0.7)
Eosinophils Relative: 3 % (ref 0–5)
Lymphocytes Relative: 16 % (ref 12–46)

## 2012-05-04 LAB — TSH: TSH: 1.572 u[IU]/mL (ref 0.350–4.500)

## 2012-05-04 LAB — CBC
HCT: 40.4 % (ref 39.0–52.0)
MCV: 86.1 fL (ref 78.0–100.0)
RBC: 4.69 MIL/uL (ref 4.22–5.81)
WBC: 6.3 10*3/uL (ref 4.0–10.5)

## 2012-05-04 LAB — LACTATE DEHYDROGENASE: LDH: 180 U/L (ref 94–250)

## 2012-05-04 MED ORDER — SODIUM CHLORIDE 0.9 % IJ SOLN
10.0000 mL | INTRAMUSCULAR | Status: DC | PRN
  Administered 2012-05-04: 10 mL via INTRAVENOUS
  Filled 2012-05-04: qty 10

## 2012-05-04 MED ORDER — HEPARIN SOD (PORK) LOCK FLUSH 100 UNIT/ML IV SOLN
INTRAVENOUS | Status: AC
  Filled 2012-05-04: qty 5

## 2012-05-04 MED ORDER — HEPARIN SOD (PORK) LOCK FLUSH 100 UNIT/ML IV SOLN
500.0000 [IU] | Freq: Once | INTRAVENOUS | Status: AC
  Administered 2012-05-04: 500 [IU] via INTRAVENOUS
  Filled 2012-05-04: qty 5

## 2012-05-04 MED ORDER — SODIUM CHLORIDE 0.9 % IJ SOLN
INTRAMUSCULAR | Status: AC
  Filled 2012-05-04: qty 10

## 2012-05-04 NOTE — Patient Instructions (Addendum)
Matthew Irwin  [id number] [birth year] Dr. Glenford Peers   [facility]  Discharge Instructions  RECOMMENDATIONS MADE BY THE CONSULTANT AND ANY TEST RESULTS WILL BE SENT TO YOUR REFERRING DOCTOR.   EXAM FINDINGS BY MD TODAY AND SIGNS AND SYMPTOMS TO REPORT TO CLINIC OR PRIMARY MD: You are doing well. Report night sweats, fevers, recurring infections, etc.  MEDICATIONS PRESCRIBED: none   INSTRUCTIONS GIVEN AND DISCUSSED: Other :  See the dermatologist about the area on your back.   SPECIAL INSTRUCTIONS/FOLLOW-UP: Lab work Needed in 6 months and Return to Clinic for port flushes every 6 weeks and to be seen in follow-up.   I acknowledge that I have been informed and understand all the instructions given to me and received a copy. I do not have any more questions at this time, but understand that I may call the Specialty Clinic at [facility] at [phone] during business hours should I have any further questions or need assistance in obtaining follow-up care.    __________________________________________  _____________  __________ Signature of Patient or Authorized Representative            Date                   Time    __________________________________________ Nurse's Signature

## 2012-05-04 NOTE — Progress Notes (Signed)
Matthew Irwin presented for Portacath access and flush. Proper placement of portacath confirmed by CXR. Portacath located left chest wall accessed with  H 20 needle. No blood return and pt reports port has not given blood in a while. Flushes easily with pain/discomfort or any resistance.Matthew Irwin presented for labwork. Portacath flushed with 20ml NS and 500U/45ml Heparin and needle removed intact. Procedure without incident. Patient tolerated procedure well.  Matthew Irwin presented for labwork. Labs per MD order drawn via Peripheral Line 23 gauge needle inserted in left antecubital.  Good blood return present. Procedure without incident.  Needle removed intact. Patient tolerated procedure well.

## 2012-05-04 NOTE — Progress Notes (Signed)
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 CHOP followed by involved field radiation therapy. He did have intrathecal chemotherapy during cycles 345 and 6 consisting of methotrexate and slight Cortef achieved a complete remission including a bone biopsy T6 postchemotherapy radiation to the changes that were suspicious on MRI in May of 2011. He presented here in 2010.  51 history of basal cell skin cancer with one pigmented lesion to the left of his thoracic surgical scar. He is overdue with his dermatologist and I think he needs to see her in the very near future to make sure this 3 color slightly irregular lesion is about 8 mm across is not pathological.  Problem #3 is obesity weighing 276 pounds on a 5 foot 10 inches frame. Problem #4 depression on therapy with good results problem #5 hypertension on therapy  He has no new problems on review of systems. He is working 7 days a week. He is very active. He is trying to maintain his diet and he is losing some weight. He states he's down over 25 pounds compared to his peak.  He remains free of B. symptomatology. Vital signs are stable. Lymph nodes are negative throughout. He has no obvious hepatosplenomegaly. Lungs are clear. The skin lesion has been mentioned above. He has no other suspicious areas. His heart shows a regular rhythm and rate without murmur or gallop. He has no peripheral adenopathy. He has no peripheral edema. We will see him back in 6 months this time for blood work. We'll do a PET scan only if he needs to have it done in the future. I have encouraged him to get back to his dermatologist  in the near future because of his past history as well as this skin lesion on the back. He promises to see her.

## 2012-05-11 ENCOUNTER — Ambulatory Visit (HOSPITAL_COMMUNITY): Payer: 59 | Admitting: Oncology

## 2012-06-15 ENCOUNTER — Encounter (HOSPITAL_COMMUNITY): Payer: BC Managed Care – PPO | Attending: Oncology

## 2012-06-15 DIAGNOSIS — C8589 Other specified types of non-Hodgkin lymphoma, extranodal and solid organ sites: Secondary | ICD-10-CM

## 2012-06-15 DIAGNOSIS — C8330 Diffuse large B-cell lymphoma, unspecified site: Secondary | ICD-10-CM

## 2012-06-15 DIAGNOSIS — Z452 Encounter for adjustment and management of vascular access device: Secondary | ICD-10-CM

## 2012-06-15 MED ORDER — SODIUM CHLORIDE 0.9 % IJ SOLN
10.0000 mL | INTRAMUSCULAR | Status: DC | PRN
  Administered 2012-06-15: 10 mL via INTRAVENOUS
  Filled 2012-06-15: qty 10

## 2012-06-15 MED ORDER — SODIUM CHLORIDE 0.9 % IJ SOLN
INTRAMUSCULAR | Status: AC
  Filled 2012-06-15: qty 10

## 2012-06-15 MED ORDER — HEPARIN SOD (PORK) LOCK FLUSH 100 UNIT/ML IV SOLN
INTRAVENOUS | Status: AC
  Filled 2012-06-15: qty 5

## 2012-06-15 MED ORDER — HEPARIN SOD (PORK) LOCK FLUSH 100 UNIT/ML IV SOLN
500.0000 [IU] | Freq: Once | INTRAVENOUS | Status: AC
  Administered 2012-06-15: 500 [IU] via INTRAVENOUS
  Filled 2012-06-15: qty 5

## 2012-06-15 NOTE — Progress Notes (Signed)
Tolerated port flush well.  Food blood return.

## 2012-06-26 ENCOUNTER — Other Ambulatory Visit: Payer: Self-pay | Admitting: Cardiology

## 2012-07-27 ENCOUNTER — Other Ambulatory Visit: Payer: Self-pay | Admitting: Cardiology

## 2012-07-27 ENCOUNTER — Encounter (HOSPITAL_COMMUNITY): Payer: BC Managed Care – PPO | Attending: Oncology

## 2012-07-27 ENCOUNTER — Telehealth: Payer: Self-pay

## 2012-07-27 DIAGNOSIS — Z452 Encounter for adjustment and management of vascular access device: Secondary | ICD-10-CM

## 2012-07-27 DIAGNOSIS — C8330 Diffuse large B-cell lymphoma, unspecified site: Secondary | ICD-10-CM

## 2012-07-27 DIAGNOSIS — C8589 Other specified types of non-Hodgkin lymphoma, extranodal and solid organ sites: Secondary | ICD-10-CM

## 2012-07-27 MED ORDER — LOSARTAN POTASSIUM 25 MG PO TABS: 25.0000 mg | ORAL_TABLET | Freq: Every day | ORAL | Status: DC

## 2012-07-27 MED ORDER — SODIUM CHLORIDE 0.9 % IJ SOLN
10.0000 mL | INTRAMUSCULAR | Status: DC | PRN
  Administered 2012-07-27: 10 mL via INTRAVENOUS
  Filled 2012-07-27: qty 10

## 2012-07-27 MED ORDER — HEPARIN SOD (PORK) LOCK FLUSH 100 UNIT/ML IV SOLN
500.0000 [IU] | Freq: Once | INTRAVENOUS | Status: AC
  Administered 2012-07-27: 500 [IU] via INTRAVENOUS
  Filled 2012-07-27: qty 5

## 2012-07-27 NOTE — Telephone Encounter (Signed)
LMOM for patient to call regarding an appointment for his refill.

## 2012-07-27 NOTE — Telephone Encounter (Signed)
Patient notified need to make a follow up appointment for [place] to refill his cozaar.

## 2012-07-27 NOTE — Progress Notes (Signed)
Tolerated port flush well.  Good blood return. 

## 2012-08-28 ENCOUNTER — Other Ambulatory Visit: Payer: Self-pay | Admitting: Cardiology

## 2012-08-28 NOTE — Telephone Encounter (Signed)
Pt needs appointment then refill can be made Fax Received. Refill Completed. Orvel Cutsforth [place]oe (R.M.A)   

## 2012-09-07 ENCOUNTER — Encounter (HOSPITAL_COMMUNITY): Payer: BC Managed Care – PPO | Attending: Oncology

## 2012-09-07 DIAGNOSIS — C8589 Other specified types of non-Hodgkin lymphoma, extranodal and solid organ sites: Secondary | ICD-10-CM

## 2012-09-07 DIAGNOSIS — Z452 Encounter for adjustment and management of vascular access device: Secondary | ICD-10-CM

## 2012-09-07 DIAGNOSIS — C8330 Diffuse large B-cell lymphoma, unspecified site: Secondary | ICD-10-CM

## 2012-09-07 MED ORDER — HEPARIN SOD (PORK) LOCK FLUSH 100 UNIT/ML IV SOLN
INTRAVENOUS | Status: AC
  Filled 2012-09-07: qty 5

## 2012-09-07 MED ORDER — SODIUM CHLORIDE 0.9 % IJ SOLN
INTRAMUSCULAR | Status: AC
  Filled 2012-09-07: qty 10

## 2012-09-07 MED ORDER — HEPARIN SOD (PORK) LOCK FLUSH 100 UNIT/ML IV SOLN
500.0000 [IU] | Freq: Once | INTRAVENOUS | Status: AC
  Administered 2012-09-07: 500 [IU] via INTRAVENOUS
  Filled 2012-09-07: qty 5

## 2012-09-07 MED ORDER — SODIUM CHLORIDE 0.9 % IJ SOLN
20.0000 mL | INTRAMUSCULAR | Status: DC | PRN
  Administered 2012-09-07: 20 mL via INTRAVENOUS
  Filled 2012-09-07: qty 20

## 2012-09-07 NOTE — Progress Notes (Signed)
Matthew Irwin presented for Portacath access and flush. Proper placement of portacath confirmed by CXR. Portacath located left chest wall accessed with  H 20 needle. Good blood return present. Portacath flushed with 20ml NS and 500U/5ml Heparin and needle removed intact. Procedure without incident. Patient tolerated procedure well.   

## 2012-09-14 ENCOUNTER — Ambulatory Visit: Payer: Self-pay | Admitting: Cardiology

## 2012-10-07 ENCOUNTER — Encounter (HOSPITAL_COMMUNITY): Payer: BC Managed Care – PPO | Attending: Oncology

## 2012-10-07 DIAGNOSIS — Z452 Encounter for adjustment and management of vascular access device: Secondary | ICD-10-CM

## 2012-10-07 DIAGNOSIS — C8330 Diffuse large B-cell lymphoma, unspecified site: Secondary | ICD-10-CM

## 2012-10-07 DIAGNOSIS — C8589 Other specified types of non-Hodgkin lymphoma, extranodal and solid organ sites: Secondary | ICD-10-CM | POA: Insufficient documentation

## 2012-10-07 MED ORDER — SODIUM CHLORIDE 0.9 % IJ SOLN
10.0000 mL | INTRAMUSCULAR | Status: DC | PRN
  Administered 2012-10-07: 10 mL via INTRAVENOUS
  Filled 2012-10-07: qty 10

## 2012-10-07 MED ORDER — HEPARIN SOD (PORK) LOCK FLUSH 100 UNIT/ML IV SOLN
INTRAVENOUS | Status: AC
  Filled 2012-10-07: qty 5

## 2012-10-07 MED ORDER — HEPARIN SOD (PORK) LOCK FLUSH 100 UNIT/ML IV SOLN
500.0000 [IU] | Freq: Once | INTRAVENOUS | Status: AC
  Administered 2012-10-07: 500 [IU] via INTRAVENOUS
  Filled 2012-10-07: qty 5

## 2012-10-07 MED ORDER — SODIUM CHLORIDE 0.9 % IJ SOLN
INTRAMUSCULAR | Status: AC
  Filled 2012-10-07: qty 10

## 2012-10-07 NOTE — Progress Notes (Signed)
Matthew Irwin presented for Portacath access and flush. Proper placement of portacath confirmed by CXR. Portacath located left chest wall accessed with  H 20 needle. Good blood return present. Portacath flushed with 20ml NS and 500U/5ml Heparin and needle removed intact. Procedure without incident. Patient tolerated procedure well.   

## 2012-10-08 ENCOUNTER — Encounter: Payer: Self-pay | Admitting: *Deleted

## 2012-10-19 ENCOUNTER — Ambulatory Visit: Payer: Self-pay | Admitting: Cardiology

## 2012-10-30 ENCOUNTER — Other Ambulatory Visit (HOSPITAL_COMMUNITY): Payer: 59

## 2012-11-03 ENCOUNTER — Ambulatory Visit (HOSPITAL_COMMUNITY): Payer: 59 | Admitting: Oncology

## 2012-11-05 ENCOUNTER — Encounter: Payer: Self-pay | Admitting: Cardiology

## 2012-11-05 ENCOUNTER — Ambulatory Visit (INDEPENDENT_AMBULATORY_CARE_PROVIDER_SITE_OTHER): Payer: BC Managed Care – PPO | Admitting: Cardiology

## 2012-11-05 VITALS — BP 119/77 | HR 76 | Ht 70.0 in | Wt 288.8 lb

## 2012-11-05 DIAGNOSIS — E785 Hyperlipidemia, unspecified: Secondary | ICD-10-CM

## 2012-11-05 DIAGNOSIS — I311 Chronic constrictive pericarditis: Secondary | ICD-10-CM

## 2012-11-05 NOTE — Patient Instructions (Addendum)
Your physician has requested that you have an echocardiogram. Echocardiography is a painless test that uses sound waves to create images of your heart. It provides your doctor with information about the size and shape of your heart and how well your heart's chambers and valves are working. This procedure takes approximately one hour. There are no restrictions for this procedure.  LABS TODAY:  BNP  Your physician wants you to follow-up in: 1 year with Dr. Shirlee Latch.  You will receive a reminder letter in the mail two months in advance. If you don't receive a letter, please call our office to schedule the follow-up appointment.

## 2012-11-05 NOTE — Progress Notes (Signed)
Patient ID: Matthew Irwin, male   DOB: [birth year], 57 y.o.   MRN: [id number] PCP: Dr. 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  Last echo in 6/12 showed EF 45-50% with septal respirophasic shift that was still present but less prominent than on pre-pericardiectomy echo.  No IVC dilation.  Patient still gets short of breath walking up an incline or climbing steps, but he is doing well on flat ground or downhill.  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  No chest pain.  No orthopnea/PND.  No leg swelling. Weight is down 5 lb since last appointment.      Labs (2/11): creatinine 0.71 Labs (6/11): K 4.1, creatinine 0.9, BNP 75 Labs (7/13): K 3.8, creatinine 0.83  ECG: NSR, nonspecific T wave changes  Allergies:  1)  ! Penicillin 2)  ! * Simvastatin 3)  ! * Pravastatin  Past Medical History: 1.  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  Chemotherapy, including Adriamycin, completed 3/10.  Radiation to chest/back in early 2010.  PET scan 11/10 with no evidence for recurrent lymphoma.  2.  Chest pain: Left heart cath (10/10) with no angiographic coronary disease.  3.  Cardiomyopathy: Nonischemic.  EF 35-40% with global hypokinesis by LV-gram 10/10.  Cardiac MRI (10/10) was limited by respiratory artifact but showed a small to moderate free-flowing pericardial effusion with no tamponade, EF 43% with global hypokinesis, no myocardial delayed enhancement (no definite evidence of myocardial infarction, infiltrative disease, or myocarditis). Echo (11/10) with EF 40-45%.  Cardiomyopathy may be secondary to Adriamycin toxicity.  Echo after pericardiectomy in (6/11) showed EF 50%. 4.  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  Had free-flowing effusion in 10/10 with no tamponade.  Patient was reimaged by echo in 11/10 showed EF 40-45% (global HK), dilated IVC, and evidence for ventricular interdependence with an organized pericardial effusion.  Hemodynamic right and left heart cath showed mean RA 14, RV 27/18, PA 29/17, mean PCWP 14, LVEDP 18, cardiac index 1.8 => there was equalization of diastolic pressure.  Also, ventricular interdependence was demonstrated by cath.  Patient underwent pericardiectomy in 11/10.  Echo after pericardiectomy 10/22/2023) showed EF 55%, moderate diastolic dysfunction, RV moderately dilated and moderately dysfunctional.  There were still some signs of constrictive physiology (respirophasic interventricular septal variation) but the IVC was small with inspiratory collapse.   Repeat echo 6/11 showed EF 50%, normal RV size and systolic function (improved from 22-Oct-2023), some septal shift with inspiration but less pronounced than in 10-22-23, and normal IVC size.  Echo (6/12) with EF 45-50%, septal and inferobasal hypokinesis, abnormal septal motion, IVC normal size.  5. Hyperlipidemia: myalgias with pravastatin and simvastatin. 6. Anxiety 7. ACEI cough.  8. Obesity  Family History: No premature CAD Positive for HTN, DM.  Sister with Turner's Syndrome  Social History: married, lives outside of [place].   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   Review of Systems        All systems reviewed and negative except as per HPI.   Current Outpatient Prescriptions  Medication Sig Dispense Refill  . aspirin 81 MG tablet Take 81 mg by mouth daily.        Marland Kitchen  cetirizine (ZYRTEC) 10 MG tablet Take 10 mg by mouth daily.        . Cholecalciferol (VITAMIN D3 SUPER STRENGTH) 2000 UNITS TABS Take 1 tablet by mouth daily.        . clonazePAM (KLONOPIN) 0.5 MG tablet Take 0.5 mg by mouth 2 (two) times daily as needed.        .  COREG 3.125 MG tablet TAKE 1 TABLET TWICE DAILY WITH BREAKFAST & SUPPER  60 each  6  . DULoxetine (CYMBALTA) 60 MG capsule Take 60 mg by mouth daily.      Marland Kitchen losartan (COZAAR) 25 MG tablet TAKE 1 TABLET DAILY  30 tablet  1  . Multiple Vitamins-Minerals (CENTRUM SILVER ULTRA MENS) TABS Take by mouth.        . Omega-3 Fatty Acids (FISH OIL) 1000 MG CAPS Take 1 capsule by mouth 2 (two) times daily.        . rosuvastatin (CRESTOR) 5 MG tablet Take 5 mg by mouth daily.        . traZODone (DESYREL) 100 MG tablet Take 100 mg by mouth at bedtime.          BP 119/77  Pulse 76  Ht 5\' 10"  (1.778 m)  Wt 288 lb 12.8 oz (130.999 kg)  BMI 41.44 kg/m2 General: NAD, obese.  Neck: No JVD, thick neck, no thyromegaly or thyroid nodule.  Lungs: Clear to auscultation bilaterally with normal respiratory effort. CV: Nondisplaced PMI.  Heart regular S1/S2, no S3/S4, no murmur.  No peripheral edema.  No carotid bruit.  Normal pedal pulses.  Abdomen: Soft, nontender, no hepatosplenomegaly, no distention.  Neurologic: Alert and oriented x 3.  Psych: Normal affect.Skin: Intact without lesions or rashes.  Extremities: No clubbing or cyanosis.   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 The probable etiology of this was thoracic radiation. He is now status post pericardiectomy. His symptoms have improved. He still has NYHA II-IIIa dyspnea (IIIb prior). Still short of breath with inclines. Followup echo in 6/12 showed EF 45-50% with normal RV function and IVC size.  There was still respirophasic variation of septal motion but less marked than on echo prior to surgery.   - Continue efforts at weight loss. - Continue Coreg and losartan given mildly decreased LV systolic function.  - He does not appear volume overloaded today so can stay off Lasix. I will check a BNP.  - Echo to follow for signs  of constriction and follow EF.  HYPERLIPIDEMIA Tolerating Crestor. Lipids followed at Dr. Kathi Der office.  Marca Ancona 11/05/2012

## 2012-11-06 ENCOUNTER — Ambulatory Visit (HOSPITAL_COMMUNITY): Payer: BC Managed Care – PPO | Attending: Internal Medicine | Admitting: Radiology

## 2012-11-06 ENCOUNTER — Other Ambulatory Visit: Payer: Self-pay | Admitting: Cardiology

## 2012-11-06 DIAGNOSIS — Z923 Personal history of irradiation: Secondary | ICD-10-CM | POA: Insufficient documentation

## 2012-11-06 DIAGNOSIS — E669 Obesity, unspecified: Secondary | ICD-10-CM | POA: Insufficient documentation

## 2012-11-06 DIAGNOSIS — Z9221 Personal history of antineoplastic chemotherapy: Secondary | ICD-10-CM | POA: Insufficient documentation

## 2012-11-06 DIAGNOSIS — I311 Chronic constrictive pericarditis: Secondary | ICD-10-CM

## 2012-11-06 DIAGNOSIS — E785 Hyperlipidemia, unspecified: Secondary | ICD-10-CM | POA: Insufficient documentation

## 2012-11-06 NOTE — Progress Notes (Signed)
Echocardiogram performed.  

## 2012-11-09 ENCOUNTER — Telehealth: Payer: Self-pay | Admitting: Cardiology

## 2012-11-09 NOTE — Telephone Encounter (Signed)
New Problem:     Patient returned your call regarding his ECHO results.  Please call back.

## 2012-11-09 NOTE — Telephone Encounter (Signed)
Spoke with pt about recent echo results. 

## 2012-11-16 ENCOUNTER — Encounter (HOSPITAL_COMMUNITY): Payer: Self-pay | Admitting: Oncology

## 2012-11-16 ENCOUNTER — Encounter (HOSPITAL_COMMUNITY): Payer: BC Managed Care – PPO | Attending: Oncology

## 2012-11-16 ENCOUNTER — Encounter (HOSPITAL_BASED_OUTPATIENT_CLINIC_OR_DEPARTMENT_OTHER): Payer: BC Managed Care – PPO | Admitting: Oncology

## 2012-11-16 ENCOUNTER — Telehealth (HOSPITAL_COMMUNITY): Payer: Self-pay

## 2012-11-16 VITALS — BP 147/83 | HR 80 | Temp 97.6°F | Wt 293.4 lb

## 2012-11-16 DIAGNOSIS — C8589 Other specified types of non-Hodgkin lymphoma, extranodal and solid organ sites: Secondary | ICD-10-CM

## 2012-11-16 DIAGNOSIS — C8330 Diffuse large B-cell lymphoma, unspecified site: Secondary | ICD-10-CM

## 2012-11-16 DIAGNOSIS — J45909 Unspecified asthma, uncomplicated: Secondary | ICD-10-CM | POA: Insufficient documentation

## 2012-11-16 DIAGNOSIS — L989 Disorder of the skin and subcutaneous tissue, unspecified: Secondary | ICD-10-CM

## 2012-11-16 DIAGNOSIS — R062 Wheezing: Secondary | ICD-10-CM

## 2012-11-16 LAB — COMPREHENSIVE METABOLIC PANEL
AST: 39 U/L — ABNORMAL HIGH (ref 0–37)
CO2: 28 mEq/L (ref 19–32)
Calcium: 9.8 mg/dL (ref 8.4–10.5)
Creatinine, Ser: 0.79 mg/dL (ref 0.50–1.35)
GFR calc non Af Amer: >90 mL/min (ref >90)

## 2012-11-16 LAB — CBC
MCH: 28.9 pg (ref 26.0–34.0)
MCHC: 33.3 g/dL (ref 30.0–36.0)
MCV: 86.6 fL (ref 78.0–100.0)
Platelets: 189 10*3/uL (ref 150–400)
RDW: 13.8 % (ref 11.5–15.5)

## 2012-11-16 LAB — DIFFERENTIAL
Basophils Absolute: 0 10*3/uL (ref 0.0–0.1)
Basophils Relative: 1 % (ref 0–1)
Eosinophils Absolute: 0.4 10*3/uL (ref 0.0–0.7)
Eosinophils Relative: 6 % — ABNORMAL HIGH (ref 0–5)
Lymphs Abs: 1.5 10*3/uL (ref 0.7–4.0)

## 2012-11-16 LAB — LACTATE DEHYDROGENASE: LDH: 176 U/L (ref 94–250)

## 2012-11-16 LAB — SEDIMENTATION RATE: Sed Rate: 33 mm/hr — ABNORMAL HIGH (ref 0–16)

## 2012-11-16 MED ORDER — HEPARIN SOD (PORK) LOCK FLUSH 100 UNIT/ML IV SOLN
INTRAVENOUS | Status: AC
  Filled 2012-11-16: qty 5

## 2012-11-16 MED ORDER — FLUTICASONE-SALMETEROL 100-50 MCG/DOSE IN AEPB: 1.0000 | INHALATION_SPRAY | Freq: Two times a day (BID) | RESPIRATORY_TRACT | Status: DC

## 2012-11-16 MED ORDER — SODIUM CHLORIDE 0.9 % IJ SOLN
10.0000 mL | INTRAMUSCULAR | Status: DC | PRN
  Administered 2012-11-16: 10 mL via INTRAVENOUS
  Filled 2012-11-16: qty 10

## 2012-11-16 MED ORDER — HEPARIN SOD (PORK) LOCK FLUSH 100 UNIT/ML IV SOLN
500.0000 [IU] | Freq: Once | INTRAVENOUS | Status: AC
  Administered 2012-11-16: 500 [IU] via INTRAVENOUS
  Filled 2012-11-16: qty 5

## 2012-11-16 NOTE — Telephone Encounter (Signed)
Per request of MD patient notified that all lab results except for AST were normal and eleveated AST probably related to his weight.  Verbalized understanding.

## 2012-11-16 NOTE — Progress Notes (Signed)
Matthew Irwin presented for Portacath access and flush. Proper placement of portacath confirmed by CXR. Portacath located lt  chest wall accessed with  H 20 needle. Good blood return present. Portacath flushed with 20ml NS and 500U/5ml Heparin and needle removed intact. Procedure without incident. Patient tolerated procedure well.   

## 2012-11-16 NOTE — Patient Instructions (Addendum)
[facility] Discharge Instructions  RECOMMENDATIONS MADE BY THE CONSULTANT AND ANY TEST RESULTS WILL BE SENT TO YOUR REFERRING PHYSICIAN.  Advair inhaler has been called in to your pharmacy. Return to clinic every 6 weeks for port flush. We will do lab work again in 6 months. Return to see MD in 6 months. Report any issues/concerns to clinic as needed prior to appointments.  Thank you for choosing [facility] to provide your oncology and hematology care.  To afford each patient quality time with our providers, please arrive at least 15 minutes before your scheduled appointment time.  With your help, our goal is to use those 15 minutes to complete the necessary work-up to ensure our physicians have the information they need to help with your evaluation and healthcare recommendations.    Effective January 1st, 2014, we ask that you re-schedule your appointment with our physicians should you arrive 10 or more minutes late for your appointment.  We strive to give you quality time with our providers, and arriving late affects you and other patients whose appointments are after yours.    Again, thank you for choosing [facility].  Our hope is that these requests will decrease the amount of time that you wait before being seen by our physicians.       _____________________________________________________________  Should you have questions after your visit to [facility], please contact our office at [phone] between the hours of 8:30 a.m. and 5:00 p.m.  Voicemails left after 4:30 p.m. will not be returned until the following business day.  For prescription refill requests, have your pharmacy contact our office with your prescription refill request.

## 2012-11-16 NOTE — Progress Notes (Signed)
Problem number 1 stage I E. 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 He also had intrathecal chemotherapy during cycles 3, 4, 5, and 6 consisting of methotrexate and Solu-Cortef. He presented here in 2010. Problem #2 basal cell skin cancer also with a pigmented lesion to the left of his thoracic surgical scar posteriorly on the back with irregular pigmentation and a slightly irregular border and he needs to see a dermatologist. This is approximately 8 mm superiorly to inferiorly at approximately 5-7 mm transverse dimension. Problem #3 recent respiratory illness with coughing, shortness of breath, and now wheezing. He remembers hearing wheezing even before he became acutely ill just before Christmas. He has had intermittent wheezing since then in spite of the antibiotic he just finished a few days to week ago. He has no prior history of asthma. This squeaky asthmatic wheeze in with inspiration and with expiration is midlung bilaterally anteriorly and posteriorly. I have E. scribed and Advair Diskus for him to try for 15-30 days and let me know how he is. Problem #4 obesity  He is finally starting to feel somewhat better but still has shortness of breath at times. He had a cardiology consultation recently and was told that his heart is doing very well. He states when he saw a cardiologist that he did not hear or feel wheezing in his chest or tightness in his chest. He has certainly had an intermittently since then however he states. He has no fever today. He did not have any green or yellow phlegm production with this illness. He does not have chest pain. His appetite is good. He has no B. Symptomatology. BP 147/83  Pulse 80  Temp 97.6 F (36.4 C) (Oral)  Wt 293 lb 6.4 oz (133.085 kg)  He is in no acute distress.  The wheezing is outlined above. He has no lymphadenopathy in the cervical, supraclavicular, infraclavicular, axillary, or inguinal areas. I cannot feel his liver or spleen. The discolored pigmented lesion on the back left of the midline remains present and is still worrisome. He promises to see a dermatologist very shortly. He states he had difficulty with loss of insurance. His heart shows a regular rhythm and rate without murmur rub or gallop. Bowel sounds are normal. He has no leg edema no arm edema.  He will let he know how the Advair Diskus works for him. If need be we'll get a pulmonary consult. We will see him as scheduled in 6 months.  We will see what his blood work shows today

## 2012-11-18 ENCOUNTER — Encounter (HOSPITAL_COMMUNITY): Payer: Self-pay

## 2012-11-18 ENCOUNTER — Other Ambulatory Visit: Payer: Self-pay

## 2012-11-18 ENCOUNTER — Ambulatory Visit (HOSPITAL_COMMUNITY): Payer: Self-pay | Admitting: Oncology

## 2012-12-28 ENCOUNTER — Encounter (HOSPITAL_COMMUNITY): Payer: BC Managed Care – PPO | Attending: Oncology

## 2012-12-28 DIAGNOSIS — C8589 Other specified types of non-Hodgkin lymphoma, extranodal and solid organ sites: Secondary | ICD-10-CM

## 2012-12-28 DIAGNOSIS — Z9889 Other specified postprocedural states: Secondary | ICD-10-CM | POA: Insufficient documentation

## 2012-12-28 DIAGNOSIS — Z95828 Presence of other vascular implants and grafts: Secondary | ICD-10-CM | POA: Insufficient documentation

## 2012-12-28 MED ORDER — HEPARIN SOD (PORK) LOCK FLUSH 100 UNIT/ML IV SOLN
500.0000 [IU] | Freq: Once | INTRAVENOUS | Status: AC
  Administered 2012-12-28: 500 [IU] via INTRAVENOUS
  Filled 2012-12-28: qty 5

## 2012-12-28 MED ORDER — SODIUM CHLORIDE 0.9 % IJ SOLN
10.0000 mL | INTRAMUSCULAR | Status: DC | PRN
  Administered 2012-12-28: 10 mL via INTRAVENOUS
  Filled 2012-12-28: qty 10

## 2012-12-28 MED ORDER — HEPARIN SOD (PORK) LOCK FLUSH 100 UNIT/ML IV SOLN
INTRAVENOUS | Status: AC
  Filled 2012-12-28: qty 5

## 2012-12-28 NOTE — Progress Notes (Signed)
Matthew Irwin presented for Portacath access and flush. Proper placement of portacath confirmed by CXR. Portacath located left chest wall accessed with  H 20 needle. Good blood return present. Portacath flushed with 20ml NS and 500U/5ml Heparin and needle removed intact. Procedure without incident. Patient tolerated procedure well.   

## 2013-02-08 ENCOUNTER — Encounter (HOSPITAL_COMMUNITY): Payer: BC Managed Care – PPO | Attending: Oncology

## 2013-02-08 DIAGNOSIS — C8581 Other specified types of non-Hodgkin lymphoma, lymph nodes of head, face, and neck: Secondary | ICD-10-CM

## 2013-02-08 DIAGNOSIS — Z452 Encounter for adjustment and management of vascular access device: Secondary | ICD-10-CM

## 2013-02-08 DIAGNOSIS — Z9889 Other specified postprocedural states: Secondary | ICD-10-CM | POA: Insufficient documentation

## 2013-02-08 DIAGNOSIS — Z95828 Presence of other vascular implants and grafts: Secondary | ICD-10-CM

## 2013-02-08 MED ORDER — HEPARIN SOD (PORK) LOCK FLUSH 100 UNIT/ML IV SOLN
500.0000 [IU] | Freq: Once | INTRAVENOUS | Status: AC
  Administered 2013-02-08: 500 [IU] via INTRAVENOUS
  Filled 2013-02-08: qty 5

## 2013-02-08 MED ORDER — DTAP-HEPATITIS B RECOMB-IPV IM SUSP: 0.5000 mL | Freq: Once | INTRAMUSCULAR | Status: DC

## 2013-02-08 MED ORDER — SODIUM CHLORIDE 0.9 % IJ SOLN
10.0000 mL | INTRAMUSCULAR | Status: DC | PRN
  Administered 2013-02-08: 10 mL via INTRAVENOUS
  Filled 2013-02-08: qty 10

## 2013-02-08 MED ORDER — HEPARIN SOD (PORK) LOCK FLUSH 100 UNIT/ML IV SOLN
INTRAVENOUS | Status: AC
  Filled 2013-02-08: qty 5

## 2013-02-08 NOTE — Progress Notes (Signed)
Matthew Irwin presented for Portacath access and flush. Proper placement of portacath confirmed by CXR. Portacath located left chest wall accessed with  H 20 needle. Good blood return present. Portacath flushed with 20ml NS and 500U/5ml Heparin and needle removed intact. Procedure without incident. Patient tolerated procedure well.   

## 2013-02-16 ENCOUNTER — Encounter: Payer: Self-pay | Admitting: *Deleted

## 2013-02-25 ENCOUNTER — Other Ambulatory Visit: Payer: Self-pay | Admitting: Nurse Practitioner

## 2013-03-11 ENCOUNTER — Encounter: Payer: Self-pay | Admitting: Nurse Practitioner

## 2013-03-11 ENCOUNTER — Ambulatory Visit (INDEPENDENT_AMBULATORY_CARE_PROVIDER_SITE_OTHER): Payer: BC Managed Care – PPO | Admitting: Nurse Practitioner

## 2013-03-11 VITALS — BP 106/67 | HR 68 | Temp 98.0°F | Ht 70.0 in | Wt 292.0 lb

## 2013-03-11 DIAGNOSIS — I10 Essential (primary) hypertension: Secondary | ICD-10-CM

## 2013-03-11 DIAGNOSIS — Z125 Encounter for screening for malignant neoplasm of prostate: Secondary | ICD-10-CM

## 2013-03-11 DIAGNOSIS — J309 Allergic rhinitis, unspecified: Secondary | ICD-10-CM

## 2013-03-11 DIAGNOSIS — G4700 Insomnia, unspecified: Secondary | ICD-10-CM

## 2013-03-11 DIAGNOSIS — E785 Hyperlipidemia, unspecified: Secondary | ICD-10-CM

## 2013-03-11 DIAGNOSIS — F411 Generalized anxiety disorder: Secondary | ICD-10-CM

## 2013-03-11 DIAGNOSIS — F329 Major depressive disorder, single episode, unspecified: Secondary | ICD-10-CM

## 2013-03-11 DIAGNOSIS — F3289 Other specified depressive episodes: Secondary | ICD-10-CM

## 2013-03-11 LAB — COMPLETE METABOLIC PANEL WITH GFR
ALT: 41 U/L (ref 0–53)
AST: 39 U/L — ABNORMAL HIGH (ref 0–37)
Albumin: 4 g/dL (ref 3.5–5.2)
CO2: 29 mEq/L (ref 19–32)
Calcium: 9.5 mg/dL (ref 8.4–10.5)
Chloride: 100 mEq/L (ref 96–112)
GFR, Est African American: >89 mL/min
Potassium: 4.2 mEq/L (ref 3.5–5.3)
Total Protein: 7.3 g/dL (ref 6.0–8.3)

## 2013-03-11 LAB — LIPID PANEL
Cholesterol: 126 mg/dL (ref 0–200)
LDL Cholesterol: 54 mg/dL (ref 0–99)
Triglycerides: 198 mg/dL — ABNORMAL HIGH (ref <150)

## 2013-03-11 LAB — PSA: PSA: 0.29 ng/mL (ref <=4.00)

## 2013-03-11 MED ORDER — CLONAZEPAM 0.5 MG PO TABS: 0.5000 mg | ORAL_TABLET | Freq: Two times a day (BID) | ORAL | Status: DC | PRN

## 2013-03-11 MED ORDER — CETIRIZINE HCL 10 MG PO TABS: 10.0000 mg | ORAL_TABLET | Freq: Every day | ORAL | Status: DC

## 2013-03-11 MED ORDER — TRAZODONE HCL 100 MG PO TABS: 100.0000 mg | ORAL_TABLET | Freq: Every day | ORAL | Status: DC

## 2013-03-11 NOTE — Patient Instructions (Signed)
Health Maintenance, Males A healthy lifestyle and preventative care can promote health and wellness.  Maintain regular health, dental, and eye exams.  Eat a healthy diet. Foods like vegetables, fruits, whole grains, low-fat dairy products, and lean protein foods contain the nutrients you need without too many calories. Decrease your intake of foods high in solid fats, added sugars, and salt. Get information about a proper diet from your caregiver, if necessary.  Regular physical exercise is one of the most important things you can do for your health. Most adults should get at least 150 minutes of moderate-intensity exercise (any activity that increases your heart rate and causes you to sweat) each week. In addition, most adults need muscle-strengthening exercises on 2 or more days a week.   Maintain a healthy weight. The body mass index (BMI) is a screening tool to identify possible weight problems. It provides an estimate of body fat based on height and weight. Your caregiver can help determine your BMI, and can help you achieve or maintain a healthy weight. For adults 20 years and older:  A BMI below 18.5 is considered underweight.  A BMI of 18.5 to 24.9 is normal.  A BMI of 25 to 29.9 is considered overweight.  A BMI of 30 and above is considered obese.  Maintain normal blood lipids and cholesterol by exercising and minimizing your intake of saturated fat. Eat a balanced diet with plenty of fruits and vegetables. Blood tests for lipids and cholesterol should begin at age 20 and be repeated every 5 years. If your lipid or cholesterol levels are high, you are over 50, or you are a high risk for heart disease, you may need your cholesterol levels checked more frequently.Ongoing high lipid and cholesterol levels should be treated with medicines, if diet and exercise are not effective.  If you smoke, find out from your caregiver how to quit. If you do not use tobacco, do not start.  If you  choose to drink alcohol, do not exceed 2 drinks per day. One drink is considered to be 12 ounces (355 mL) of beer, 5 ounces (148 mL) of wine, or 1.5 ounces (44 mL) of liquor.  Avoid use of street drugs. Do not share needles with anyone. Ask for help if you need support or instructions about stopping the use of drugs.  High blood pressure causes heart disease and increases the risk of stroke. Blood pressure should be checked at least every 1 to 2 years. Ongoing high blood pressure should be treated with medicines if weight loss and exercise are not effective.  If you are 45 to 56 years old, ask your caregiver if you should take aspirin to prevent heart disease.  Diabetes screening involves taking a blood sample to check your fasting blood sugar level. This should be done once every 3 years, after age 45, if you are within normal weight and without risk factors for diabetes. Testing should be considered at a younger age or be carried out more frequently if you are overweight and have at least 1 risk factor for diabetes.  Colorectal cancer can be detected and often prevented. Most routine colorectal cancer screening begins at the age of 50 and continues through age 75. However, your caregiver may recommend screening at an earlier age if you have risk factors for colon cancer. On a yearly basis, your caregiver may provide home test kits to check for hidden blood in the stool. Use of a small camera at the end of a tube,   to directly examine the colon (sigmoidoscopy or colonoscopy), can detect the earliest forms of colorectal cancer. Talk to your caregiver about this at age 50, when routine screening begins. Direct examination of the colon should be repeated every 5 to 10 years through age 75, unless early forms of pre-cancerous polyps or small growths are found.  Hepatitis C blood testing is recommended for all people born from 1945 through 1965 and any individual with known risks for hepatitis C.  Healthy  men should no longer receive prostate-specific antigen (PSA) blood tests as part of routine cancer screening. Consult with your caregiver about prostate cancer screening.  Testicular cancer screening is not recommended for adolescents or adult males who have no symptoms. Screening includes self-exam, caregiver exam, and other screening tests. Consult with your caregiver about any symptoms you have or any concerns you have about testicular cancer.  Practice safe sex. Use condoms and avoid high-risk sexual practices to reduce the spread of sexually transmitted infections (STIs).  Use sunscreen with a sun protection factor (SPF) of 30 or greater. Apply sunscreen liberally and repeatedly throughout the day. You should seek shade when your shadow is shorter than you. Protect yourself by wearing long sleeves, pants, a wide-brimmed hat, and sunglasses year round, whenever you are outdoors.  Notify your caregiver of new moles or changes in moles, especially if there is a change in shape or color. Also notify your caregiver if a mole is larger than the size of a pencil eraser.  A one-time screening for abdominal aortic aneurysm (AAA) and surgical repair of large AAAs by sound wave imaging (ultrasonography) is recommended for ages 65 to 75 years who are current or former smokers.  Stay current with your immunizations. Document Released: 04/04/2008 Document Revised: 12/30/2011 Document Reviewed: 03/04/2011 [employer] Patient Information 2014 [employer].  

## 2013-03-11 NOTE — Progress Notes (Signed)
Subjective:    Patient ID: Matthew Irwin, male    DOB: 08/11/1956, 57 y.o.   MRN: [id number]  Hyperlipidemia This is a chronic problem. The current episode started more than 1 year ago. The problem is controlled. Recent lipid tests were reviewed and are normal. Exacerbating diseases include obesity. He has no history of diabetes. There are no known factors aggravating his hyperlipidemia. Pertinent negatives include no chest pain, focal weakness, myalgias or shortness of breath. Current antihyperlipidemic treatment includes statins. The current treatment provides significant improvement of lipids. Compliance problems include adherence to diet and adherence to exercise.  Risk factors for coronary artery disease include hypertension, male sex and obesity.  Hypertension This is a chronic problem. The current episode started more than 1 year ago. The problem has been resolved since onset. The problem is controlled. Associated symptoms include blurred vision. Pertinent negatives include no chest pain, headaches, neck pain, palpitations, peripheral edema or shortness of breath. There are no associated agents to hypertension. Risk factors for coronary artery disease include dyslipidemia, obesity, male gender and sedentary lifestyle. Past treatments include angiotensin blockers and beta blockers. The current treatment provides moderate improvement. Compliance problems include diet and exercise.   Depression Lexapro and clonazepam- Working well- No c/o side effects    Review of Systems  HENT: Negative for neck pain.   Eyes: Positive for blurred vision.  Respiratory: Negative for shortness of breath.   Cardiovascular: Negative for chest pain and palpitations.  Musculoskeletal: Negative for myalgias.  Neurological: Negative for focal weakness and headaches.       Objective:   Physical Exam  Constitutional: He is oriented to person, place, and time. He appears well-developed and well-nourished.  HENT:   Head: Normocephalic.  Right Ear: External ear normal.  Left Ear: External ear normal.  Nose: Nose normal.  Mouth/Throat: Oropharynx is clear and moist.  Eyes: EOM are normal. Pupils are equal, round, and reactive to light.  Neck: Normal range of motion. Neck supple. No thyromegaly present.  Cardiovascular: Normal rate, regular rhythm, normal heart sounds and intact distal pulses.   No murmur heard. Pulmonary/Chest: Effort normal and breath sounds normal. He has no wheezes. He has no rales.  Abdominal: Soft. Bowel sounds are normal.  Genitourinary: Prostate normal and penis normal.  Musculoskeletal: Normal range of motion.  Neurological: He is alert and oriented to person, place, and time.  Skin: Skin is warm and dry.  Psychiatric: He has a normal mood and affect. His behavior is normal. Judgment and thought content normal.   BP 106/67  Pulse 68  Temp(Src) 98 F (36.7 C) (Oral)  Ht 5\' 10"  (1.778 m)  Wt 292 lb (132.45 kg)  BMI 41.9 kg/m2        Assessment & Plan:  1. Hyperlipidemia Low fat diet and exercise - NMR Lipoprofile with Lipids  2. Hypertension Low Na+ diet - COMPLETE METABOLIC PANEL WITH GFR  3. Screening for prostate cancer  - PSA  4. GAD (generalized anxiety disorder) Stress management - clonazePAM (KLONOPIN) 0.5 MG tablet; Take 1 tablet (0.5 mg total) by mouth 2 (two) times daily as needed.  Dispense: 60 tablet; Refill: 1  5. Depression Stress management  6. Allergic rhinitis Avoid allergens - cetirizine (ZYRTEC) 10 MG tablet; Take 1 tablet (10 mg total) by mouth daily.  Dispense: 30 tablet; Refill: 5  7. Insomnia Bedtime ritual - traZODone (DESYREL) 100 MG tablet; Take 1 tablet (100 mg total) by mouth at bedtime.  Dispense: 30 tablet; Refill:  5   Mary-Margaret Daphine Deutscher, FNP

## 2013-03-12 ENCOUNTER — Ambulatory Visit: Payer: Self-pay | Admitting: Nurse Practitioner

## 2013-03-22 ENCOUNTER — Encounter (HOSPITAL_COMMUNITY): Payer: BC Managed Care – PPO | Attending: Oncology

## 2013-03-22 DIAGNOSIS — Z9889 Other specified postprocedural states: Secondary | ICD-10-CM | POA: Insufficient documentation

## 2013-03-22 DIAGNOSIS — Z452 Encounter for adjustment and management of vascular access device: Secondary | ICD-10-CM

## 2013-03-22 DIAGNOSIS — C8581 Other specified types of non-Hodgkin lymphoma, lymph nodes of head, face, and neck: Secondary | ICD-10-CM

## 2013-03-22 MED ORDER — HEPARIN SOD (PORK) LOCK FLUSH 100 UNIT/ML IV SOLN
INTRAVENOUS | Status: AC
  Filled 2013-03-22: qty 5

## 2013-03-22 MED ORDER — HEPARIN SOD (PORK) LOCK FLUSH 100 UNIT/ML IV SOLN
500.0000 [IU] | Freq: Once | INTRAVENOUS | Status: AC
  Administered 2013-03-22: 500 [IU] via INTRAVENOUS
  Filled 2013-03-22: qty 5

## 2013-03-22 MED ORDER — SODIUM CHLORIDE 0.9 % IJ SOLN
10.0000 mL | INTRAMUSCULAR | Status: DC | PRN
  Administered 2013-03-22: 10 mL via INTRAVENOUS
  Filled 2013-03-22: qty 10

## 2013-03-22 NOTE — Progress Notes (Signed)
Matthew Irwin presented for Portacath access and flush. Proper placement of portacath confirmed by CXR. Portacath located lt  chest wall accessed with  H 20 needle. Good blood return present. Portacath flushed with 20ml NS and 500U/5ml Heparin and needle removed intact. Procedure without incident. Patient tolerated procedure well.   

## 2013-04-30 ENCOUNTER — Other Ambulatory Visit (HOSPITAL_COMMUNITY): Payer: 59

## 2013-05-10 ENCOUNTER — Encounter (HOSPITAL_COMMUNITY): Payer: BC Managed Care – PPO | Attending: Oncology

## 2013-05-10 DIAGNOSIS — C8589 Other specified types of non-Hodgkin lymphoma, extranodal and solid organ sites: Secondary | ICD-10-CM

## 2013-05-10 DIAGNOSIS — C8330 Diffuse large B-cell lymphoma, unspecified site: Secondary | ICD-10-CM

## 2013-05-10 DIAGNOSIS — J45909 Unspecified asthma, uncomplicated: Secondary | ICD-10-CM

## 2013-05-10 LAB — COMPREHENSIVE METABOLIC PANEL
BUN: 13 mg/dL (ref 6–23)
CO2: 31 mEq/L (ref 19–32)
Chloride: 99 mEq/L (ref 96–112)
Creatinine, Ser: 0.92 mg/dL (ref 0.50–1.35)
GFR calc non Af Amer: >90 mL/min (ref >90)
Total Bilirubin: 0.4 mg/dL (ref 0.3–1.2)

## 2013-05-10 LAB — CBC WITH DIFFERENTIAL/PLATELET
Basophils Relative: 0 % (ref 0–1)
HCT: 39 % (ref 39.0–52.0)
Hemoglobin: 12.9 g/dL — ABNORMAL LOW (ref 13.0–17.0)
Lymphocytes Relative: 23 % (ref 12–46)
MCHC: 33.1 g/dL (ref 30.0–36.0)
MCV: 89.4 fL (ref 78.0–100.0)
Monocytes Absolute: 0.6 10*3/uL (ref 0.1–1.0)
Monocytes Relative: 10 % (ref 3–12)
Neutro Abs: 3.5 10*3/uL (ref 1.7–7.7)

## 2013-05-10 LAB — LACTATE DEHYDROGENASE: LDH: 173 U/L (ref 94–250)

## 2013-05-10 LAB — SEDIMENTATION RATE: Sed Rate: 24 mm/hr — ABNORMAL HIGH (ref 0–16)

## 2013-05-10 MED ORDER — HEPARIN SOD (PORK) LOCK FLUSH 100 UNIT/ML IV SOLN
INTRAVENOUS | Status: AC
  Filled 2013-05-10: qty 5

## 2013-05-10 MED ORDER — SODIUM CHLORIDE 0.9 % IJ SOLN
10.0000 mL | INTRAMUSCULAR | Status: DC | PRN
  Administered 2013-05-10: 10 mL via INTRAVENOUS
  Filled 2013-05-10: qty 10

## 2013-05-10 MED ORDER — HEPARIN SOD (PORK) LOCK FLUSH 100 UNIT/ML IV SOLN
500.0000 [IU] | Freq: Once | INTRAVENOUS | Status: AC
  Administered 2013-05-10: 500 [IU] via INTRAVENOUS
  Filled 2013-05-10: qty 5

## 2013-05-10 NOTE — Progress Notes (Signed)
Matthew Irwin presented for Portacath access and flush. Proper placement of portacath confirmed by CXR. Portacath located left chest wall accessed with  H 20 needle. Good blood return present. Portacath flushed with 20ml NS and 500U/5ml Heparin and needle removed intact. Procedure without incident. Patient tolerated procedure well.   

## 2013-05-17 ENCOUNTER — Other Ambulatory Visit (HOSPITAL_COMMUNITY): Payer: BC Managed Care – PPO

## 2013-05-19 ENCOUNTER — Encounter (HOSPITAL_BASED_OUTPATIENT_CLINIC_OR_DEPARTMENT_OTHER): Payer: BC Managed Care – PPO

## 2013-05-19 ENCOUNTER — Encounter (HOSPITAL_COMMUNITY): Payer: Self-pay

## 2013-05-19 VITALS — BP 99/61 | HR 69 | Temp 97.7°F | Resp 18 | Wt 292.0 lb

## 2013-05-19 DIAGNOSIS — C8330 Diffuse large B-cell lymphoma, unspecified site: Secondary | ICD-10-CM

## 2013-05-19 DIAGNOSIS — C8589 Other specified types of non-Hodgkin lymphoma, extranodal and solid organ sites: Secondary | ICD-10-CM

## 2013-05-19 NOTE — Patient Instructions (Addendum)
[facility] Discharge Instructions  RECOMMENDATIONS MADE BY THE CONSULTANT AND ANY TEST RESULTS WILL BE SENT TO YOUR REFERRING PHYSICIAN.  EXAM FINDINGS BY THE PHYSICIAN TODAY AND SIGNS OR SYMPTOMS TO REPORT TO CLINIC OR PRIMARY PHYSICIAN: Exam and discussion by Dr. Sharia Reeve.  You are doing well.  Your blood work is stable.  MEDICATIONS PRESCRIBED:  none  INSTRUCTIONS GIVEN AND DISCUSSED: Report any new lumps, night sweats, fevers, etc.  SPECIAL INSTRUCTIONS/FOLLOW-UP: Follow-up in 6 months.  Thank you for choosing [facility] to provide your oncology and hematology care.  To afford each patient quality time with our providers, please arrive at least 15 minutes before your scheduled appointment time.  With your help, our goal is to use those 15 minutes to complete the necessary work-up to ensure our physicians have the information they need to help with your evaluation and healthcare recommendations.    Effective January 1st, 2014, we ask that you re-schedule your appointment with our physicians should you arrive 10 or more minutes late for your appointment.  We strive to give you quality time with our providers, and arriving late affects you and other patients whose appointments are after yours.    Again, thank you for choosing [facility].  Our hope is that these requests will decrease the amount of time that you wait before being seen by our physicians.       _____________________________________________________________  Should you have questions after your visit to [facility], please contact our office at [phone] between the hours of 8:30 a.m. and 5:00 p.m.  Voicemails left after 4:30 p.m. will not be returned until the following business day.  For prescription refill requests, have your pharmacy contact our office with your prescription refill request.

## 2013-05-19 NOTE — Progress Notes (Signed)
[facility] [phone]   [phone]  OFFICE PROGRESS NOTE  Rudi Heap, MD [address] 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 He also had intrathecal chemotherapy during cycles 3, 4, 5, and 6 consisting of methotrexate and Solu-Cortef. He presented here in 2010   INTERVAL HISTORY:   Matthew Irwin 57 y.o. male returns to the clinic today for scheduled followup.  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  Overall he tells me he feels well and denies any night sweats, unintended weight loss, lymphadenopathy ,fever or chills .  He denies any significant fatigue or pain.   MEDICAL HISTORY: Past Medical History  Diagnosis Date  . Chest pain     left heart catherization  . History of cardiac catheterization   . Cardiomyopathy   . Subacute effusive constrictive pericarditis   . Hyperlipidemia     myalgias with pravastatin and simvastatin.  Marland Kitchen Anxiety   . ACEI/ARB contraindicated   . CHF (congestive heart failure)   . Obesity 08/16/2011  . NHL (non-Hodgkin's lymphoma)     history of  . HTN (hypertension)     ALLERGIES:  is allergic to penicillins and simvastatin.  MEDICATIONS:  Current Outpatient Prescriptions  Medication Sig Dispense Refill  . aspirin 81 MG tablet Take 81 mg by mouth daily.        Marland Kitchen atorvastatin (LIPITOR) 40 MG tablet Take 40 mg by mouth daily.      . cetirizine (ZYRTEC) 10 MG tablet Take 1 tablet (10 mg total) by mouth daily.  30  tablet  5  . clonazePAM (KLONOPIN) 0.5 MG tablet Take 1 tablet (0.5 mg total) by mouth 2 (two) times daily as needed.  60 tablet  1  . COREG 3.125 MG tablet TAKE 1 TABLET TWICE DAILY WITH BREAKFAST & SUPPER  60 each  6  . escitalopram (LEXAPRO) 20 MG tablet Take 20 mg by mouth daily.      Marland Kitchen losartan (COZAAR) 25 MG tablet TAKE 1 TABLET DAILY  30 tablet  11  . Multiple Vitamins-Minerals (CENTRUM SILVER ULTRA MENS) TABS Take by mouth.        . Omega-3 Fatty Acids (FISH OIL) 1000 MG CAPS Take 1 capsule by mouth 2 (two) times daily.        . traZODone (DESYREL) 100 MG tablet Take 1 tablet (100 mg total) by mouth at bedtime.  30 tablet  5  . Cholecalciferol (VITAMIN D3 SUPER STRENGTH) 2000 UNITS TABS Take 1 tablet by mouth daily.        . vitamin C (ASCORBIC ACID) 500 MG tablet Take 500 mg by mouth daily.       No current facility-administered medications for this visit.    SURGICAL HISTORY:  Past Surgical History  Procedure Laterality Date  . Left heart cath  10/10  . Nasal sinus surgery    . Hemorrhoid surgery    . Tumor removal    . Back surgery       REVIEW  OF SYSTEMS: 14 point review of system is as in the history above otherwise negative.   PHYSICAL EXAMINATION:  Blood pressure 99/61, pulse 69, temperature 97.7 F (36.5 C), temperature source Oral, resp. rate 18, weight 292 lb (132.45 kg). GENERAL: No acute distress.obese. SKIN:  No rashes or significant lesions . No ecchymosis or petechial rash. HEAD: Normocephalic, No masses, lesions, tenderness or abnormalities  EYES: Conjunctiva are pink and non-injected and no jaundice ENT: External ears normal ,lips, buccal mucosa, and tongue normal and mucous membranes are moist . No evidence of thrush. LYMPH: No palpable lymphadenopathy, in the neck, supraclavicular areas or axilla or inguinal areas LUNGS: Clear to auscultation , no crackles or wheezes HEART: regular rate & rhythm, no murmurs, no gallops, S1 normal and S2 normal and no  S3. ABDOMEN: Abdomen soft, non-tender, no masses or organomegaly and no hepatosplenomegaly palpable MSK: scar of previous back surgery noted.  No suspicious lesion on examination of the skin of the back. EXTREMITIES: No edema, no skin discoloration or tenderness NEURO: Alert & oriented , no focal motor  deficits.     LABORATORY DATA: Lab Results  Component Value Date   WBC 5.6 05/10/2013   HGB 12.9* 05/10/2013   HCT 39.0 05/10/2013   MCV 89.4 05/10/2013   PLT 171 05/10/2013      Chemistry      Component Value Date/Time   NA 136 05/10/2013 1509   K 3.6 05/10/2013 1509   CL 99 05/10/2013 1509   CO2 31 05/10/2013 1509   BUN 13 05/10/2013 1509   CREATININE 0.92 05/10/2013 1509   CREATININE 0.89 03/11/2013 1202      Component Value Date/Time   CALCIUM 9.6 05/10/2013 1509   ALKPHOS 109 05/10/2013 1509   AST 30 05/10/2013 1509   ALT 38 05/10/2013 1509   BILITOT 0.4 05/10/2013 1509       RADIOGRAPHIC STUDIES: No results found.   ASSESSMENT:  Patient has no evidence of disease at this time . Patient is more than 2 years from diagnosis the risk of recurrence is small at this time.  PLAN:  1. Return to clinic in 6 months for routine follow. 2.I counseled him on B symptoms and asked him to call for a sooner appointment if he notices any. 3.CBC /CMP/ LDH ordered for next visit in 6 months.   All questions were satisfactorily answered. Patient knows to call if  any concern arises.  I spent more than 50 % counseling the patient face to face. The total time spent in the appointment was 30 minutes.   Sherral Hammers, MD FACP. Hematology/Oncology.

## 2013-06-22 ENCOUNTER — Other Ambulatory Visit (HOSPITAL_COMMUNITY): Payer: BC Managed Care – PPO

## 2013-06-22 ENCOUNTER — Encounter (HOSPITAL_COMMUNITY): Payer: BC Managed Care – PPO | Attending: Oncology

## 2013-06-22 DIAGNOSIS — Z452 Encounter for adjustment and management of vascular access device: Secondary | ICD-10-CM

## 2013-06-22 DIAGNOSIS — C8589 Other specified types of non-Hodgkin lymphoma, extranodal and solid organ sites: Secondary | ICD-10-CM

## 2013-06-22 DIAGNOSIS — C8330 Diffuse large B-cell lymphoma, unspecified site: Secondary | ICD-10-CM

## 2013-06-22 MED ORDER — HEPARIN SOD (PORK) LOCK FLUSH 100 UNIT/ML IV SOLN
500.0000 [IU] | Freq: Once | INTRAVENOUS | Status: AC
  Administered 2013-06-22: 500 [IU] via INTRAVENOUS
  Filled 2013-06-22: qty 5

## 2013-06-22 MED ORDER — HEPARIN SOD (PORK) LOCK FLUSH 100 UNIT/ML IV SOLN
INTRAVENOUS | Status: AC
  Filled 2013-06-22: qty 5

## 2013-06-22 MED ORDER — SODIUM CHLORIDE 0.9 % IJ SOLN
10.0000 mL | INTRAMUSCULAR | Status: DC | PRN
  Administered 2013-06-22: 10 mL via INTRAVENOUS
  Filled 2013-06-22: qty 10

## 2013-06-22 NOTE — Progress Notes (Signed)
Lorimer E Shareef presented for Portacath access and flush. Proper placement of portacath confirmed by CXR. Portacath located left chest wall accessed with  H 20 needle. Good blood return present. Portacath flushed with 20ml NS and 500U/5ml Heparin and needle removed intact. Procedure without incident. Patient tolerated procedure well.   

## 2013-06-28 ENCOUNTER — Other Ambulatory Visit: Payer: Self-pay | Admitting: Nurse Practitioner

## 2013-06-30 NOTE — Telephone Encounter (Signed)
Last seen 03/11/13  Last lipids 03/11/13    MMM

## 2013-08-02 ENCOUNTER — Other Ambulatory Visit: Payer: Self-pay | Admitting: Nurse Practitioner

## 2013-08-03 ENCOUNTER — Encounter (HOSPITAL_COMMUNITY): Payer: BC Managed Care – PPO

## 2013-08-03 NOTE — Telephone Encounter (Signed)
Last sen 03/11/13  MMM

## 2013-08-04 ENCOUNTER — Encounter (HOSPITAL_COMMUNITY): Payer: BC Managed Care – PPO | Attending: Oncology

## 2013-08-04 DIAGNOSIS — C8589 Other specified types of non-Hodgkin lymphoma, extranodal and solid organ sites: Secondary | ICD-10-CM

## 2013-08-04 DIAGNOSIS — Z95828 Presence of other vascular implants and grafts: Secondary | ICD-10-CM

## 2013-08-04 DIAGNOSIS — Z452 Encounter for adjustment and management of vascular access device: Secondary | ICD-10-CM

## 2013-08-04 DIAGNOSIS — Z9889 Other specified postprocedural states: Secondary | ICD-10-CM | POA: Insufficient documentation

## 2013-08-04 MED ORDER — HEPARIN SOD (PORK) LOCK FLUSH 100 UNIT/ML IV SOLN
500.0000 [IU] | Freq: Once | INTRAVENOUS | Status: AC
  Administered 2013-08-04: 500 [IU] via INTRAVENOUS

## 2013-08-04 MED ORDER — HEPARIN SOD (PORK) LOCK FLUSH 100 UNIT/ML IV SOLN
INTRAVENOUS | Status: AC
  Filled 2013-08-04: qty 5

## 2013-08-04 MED ORDER — SODIUM CHLORIDE 0.9 % IJ SOLN
10.0000 mL | INTRAMUSCULAR | Status: DC | PRN
  Administered 2013-08-04: 10 mL via INTRAVENOUS

## 2013-08-04 NOTE — Progress Notes (Signed)
Matthew Irwin presented for Portacath access and flush. Proper placement of portacath confirmed by CXR. Portacath located left chest wall accessed with  H 20 needle. Good blood return present. Portacath flushed with 20ml NS and 500U/5ml Heparin and needle removed intact. Procedure without incident. Patient tolerated procedure well.   

## 2013-08-18 ENCOUNTER — Other Ambulatory Visit: Payer: Self-pay | Admitting: Nurse Practitioner

## 2013-08-19 NOTE — Telephone Encounter (Signed)
Last seen 03/11/13  MMM 

## 2013-09-10 ENCOUNTER — Other Ambulatory Visit: Payer: Self-pay | Admitting: Nurse Practitioner

## 2013-09-13 NOTE — Telephone Encounter (Signed)
Last seen 03/11/13  MMM 

## 2013-09-14 ENCOUNTER — Encounter (HOSPITAL_COMMUNITY): Payer: BC Managed Care – PPO

## 2013-09-21 ENCOUNTER — Other Ambulatory Visit (HOSPITAL_COMMUNITY): Payer: Self-pay

## 2013-09-22 ENCOUNTER — Encounter (HOSPITAL_COMMUNITY): Payer: BC Managed Care – PPO | Attending: Oncology

## 2013-09-22 DIAGNOSIS — C8589 Other specified types of non-Hodgkin lymphoma, extranodal and solid organ sites: Secondary | ICD-10-CM

## 2013-09-22 DIAGNOSIS — Z95828 Presence of other vascular implants and grafts: Secondary | ICD-10-CM

## 2013-09-22 DIAGNOSIS — Z452 Encounter for adjustment and management of vascular access device: Secondary | ICD-10-CM

## 2013-09-22 DIAGNOSIS — Z9889 Other specified postprocedural states: Secondary | ICD-10-CM | POA: Insufficient documentation

## 2013-09-22 MED ORDER — HEPARIN SOD (PORK) LOCK FLUSH 100 UNIT/ML IV SOLN
500.0000 [IU] | Freq: Once | INTRAVENOUS | Status: AC
  Administered 2013-09-22: 500 [IU] via INTRAVENOUS

## 2013-09-22 MED ORDER — HEPARIN SOD (PORK) LOCK FLUSH 100 UNIT/ML IV SOLN
INTRAVENOUS | Status: AC
  Filled 2013-09-22: qty 5

## 2013-09-22 MED ORDER — SODIUM CHLORIDE 0.9 % IJ SOLN
10.0000 mL | INTRAMUSCULAR | Status: DC | PRN
  Administered 2013-09-22: 10 mL via INTRAVENOUS

## 2013-09-22 NOTE — Progress Notes (Signed)
Matthew Irwin presented for Portacath access and flush. Portacath located lt chest wall accessed with  H 20 needle. Good blood return present. Portacath flushed with 20ml NS and 500U/52ml Heparin and needle removed intact. Procedure without incident. Patient tolerated procedure well.

## 2013-09-28 ENCOUNTER — Other Ambulatory Visit: Payer: Self-pay | Admitting: Nurse Practitioner

## 2013-10-01 NOTE — Telephone Encounter (Signed)
Last seen 03/11/13  MMM 

## 2013-10-01 NOTE — Telephone Encounter (Signed)
NTBS.

## 2013-10-13 ENCOUNTER — Other Ambulatory Visit: Payer: Self-pay | Admitting: Nurse Practitioner

## 2013-10-18 ENCOUNTER — Other Ambulatory Visit: Payer: Self-pay | Admitting: Nurse Practitioner

## 2013-10-18 NOTE — Telephone Encounter (Signed)
Last seen 03/11/13  MMM 

## 2013-10-20 NOTE — Telephone Encounter (Signed)
Please call in klonopin rx 

## 2013-10-20 NOTE — Telephone Encounter (Signed)
Please advise 

## 2013-10-25 NOTE — Telephone Encounter (Signed)
Called into pharmacy

## 2013-10-26 ENCOUNTER — Encounter (HOSPITAL_COMMUNITY): Payer: BC Managed Care – PPO

## 2013-10-26 ENCOUNTER — Encounter (HOSPITAL_COMMUNITY): Payer: BC Managed Care – PPO | Attending: Oncology

## 2013-10-26 DIAGNOSIS — C8330 Diffuse large B-cell lymphoma, unspecified site: Secondary | ICD-10-CM

## 2013-10-26 DIAGNOSIS — Z452 Encounter for adjustment and management of vascular access device: Secondary | ICD-10-CM

## 2013-10-26 DIAGNOSIS — C8589 Other specified types of non-Hodgkin lymphoma, extranodal and solid organ sites: Secondary | ICD-10-CM

## 2013-10-26 LAB — COMPREHENSIVE METABOLIC PANEL
ALBUMIN: 4 g/dL (ref 3.5–5.2)
ALT: 54 U/L — ABNORMAL HIGH (ref 0–53)
AST: 49 U/L — ABNORMAL HIGH (ref 0–37)
Alkaline Phosphatase: 99 U/L (ref 39–117)
BUN: 13 mg/dL (ref 6–23)
CALCIUM: 9.6 mg/dL (ref 8.4–10.5)
CO2: 30 mEq/L (ref 19–32)
Chloride: 99 mEq/L (ref 96–112)
Creatinine, Ser: 0.86 mg/dL (ref 0.50–1.35)
GFR calc non Af Amer: >90 mL/min (ref >90)
GLUCOSE: 87 mg/dL (ref 70–99)
Potassium: 4 mEq/L (ref 3.7–5.3)
SODIUM: 140 meq/L (ref 137–147)
TOTAL PROTEIN: 8.1 g/dL (ref 6.0–8.3)
Total Bilirubin: 0.6 mg/dL (ref 0.3–1.2)

## 2013-10-26 LAB — CBC WITH DIFFERENTIAL/PLATELET
Basophils Absolute: 0 10*3/uL (ref 0.0–0.1)
Basophils Relative: 0 % (ref 0–1)
EOS ABS: 0.3 10*3/uL (ref 0.0–0.7)
EOS PCT: 4 % (ref 0–5)
HCT: 40.2 % (ref 39.0–52.0)
Hemoglobin: 13.5 g/dL (ref 13.0–17.0)
Lymphocytes Relative: 18 % (ref 12–46)
Lymphs Abs: 1.1 10*3/uL (ref 0.7–4.0)
MCH: 29.8 pg (ref 26.0–34.0)
MCHC: 33.6 g/dL (ref 30.0–36.0)
MCV: 88.7 fL (ref 78.0–100.0)
MONOS PCT: 10 % (ref 3–12)
Monocytes Absolute: 0.7 10*3/uL (ref 0.1–1.0)
NEUTROS PCT: 68 % (ref 43–77)
Neutro Abs: 4.4 10*3/uL (ref 1.7–7.7)
PLATELETS: 203 10*3/uL (ref 150–400)
RBC: 4.53 MIL/uL (ref 4.22–5.81)
RDW: 13.3 % (ref 11.5–15.5)
WBC: 6.4 10*3/uL (ref 4.0–10.5)

## 2013-10-26 LAB — LACTATE DEHYDROGENASE: LDH: 184 U/L (ref 94–250)

## 2013-10-26 MED ORDER — SODIUM CHLORIDE 0.9 % IJ SOLN
10.0000 mL | INTRAMUSCULAR | Status: DC | PRN
  Administered 2013-10-26: 10 mL via INTRAVENOUS

## 2013-10-26 MED ORDER — HEPARIN SOD (PORK) LOCK FLUSH 100 UNIT/ML IV SOLN
INTRAVENOUS | Status: AC
  Filled 2013-10-26: qty 5

## 2013-10-26 MED ORDER — HEPARIN SOD (PORK) LOCK FLUSH 100 UNIT/ML IV SOLN
500.0000 [IU] | Freq: Once | INTRAVENOUS | Status: AC
Start: 2013-10-26 — End: 2013-10-26
  Administered 2013-10-26: 500 [IU] via INTRAVENOUS

## 2013-10-26 NOTE — Progress Notes (Signed)
[name] presented for Portacath access and flush. Portacath located left chest wall accessed with  H 20 needle.  Good blood return present - labs drawn from site. Portacath flushed with 58ml NS and 500U/36ml Heparin and needle removed intact.  Procedure tolerated well and without incident.

## 2013-10-27 ENCOUNTER — Other Ambulatory Visit: Payer: Self-pay | Admitting: Nurse Practitioner

## 2013-11-01 ENCOUNTER — Other Ambulatory Visit: Payer: Self-pay | Admitting: Nurse Practitioner

## 2013-11-04 ENCOUNTER — Encounter (HOSPITAL_COMMUNITY): Payer: Self-pay

## 2013-11-04 ENCOUNTER — Encounter (HOSPITAL_BASED_OUTPATIENT_CLINIC_OR_DEPARTMENT_OTHER): Payer: BC Managed Care – PPO

## 2013-11-04 VITALS — BP 114/70 | HR 82 | Temp 97.8°F | Resp 16 | Wt 296.0 lb

## 2013-11-04 DIAGNOSIS — J45909 Unspecified asthma, uncomplicated: Secondary | ICD-10-CM

## 2013-11-04 DIAGNOSIS — C8330 Diffuse large B-cell lymphoma, unspecified site: Secondary | ICD-10-CM

## 2013-11-04 DIAGNOSIS — Z87898 Personal history of other specified conditions: Secondary | ICD-10-CM

## 2013-11-04 NOTE — Addendum Note (Signed)
Addended by: Mellissa Kohut on: 11/04/2013 04:19 PM   Modules accepted: Orders

## 2013-11-04 NOTE — Patient Instructions (Signed)
[place] Discharge Instructions  RECOMMENDATIONS MADE BY THE CONSULTANT AND ANY TEST RESULTS WILL BE SENT TO YOUR REFERRING PHYSICIAN.  EXAM FINDINGS BY THE PHYSICIAN TODAY AND SIGNS OR SYMPTOMS TO REPORT TO CLINIC OR PRIMARY PHYSICIAN: Exam and findings as discussed by Dr. Barnet Glasgow.  Blood work is stable.  Report fevers, night sweats, unexplained weight loss, etc.  MEDICATIONS PRESCRIBED:  none  INSTRUCTIONS/FOLLOW-UP: Return in 6 months with blood work and office visit.  Thank you for choosing [facility] to provide your oncology and hematology care.  To afford each patient quality time with our providers, please arrive at least 15 minutes before your scheduled appointment time.  With your help, our goal is to use those 15 minutes to complete the necessary work-up to ensure our physicians have the information they need to help with your evaluation and healthcare recommendations.    Effective January 1st, 2014, we ask that you re-schedule your appointment with our physicians should you arrive 10 or more minutes late for your appointment.  We strive to give you quality time with our providers, and arriving late affects you and other patients whose appointments are after yours.    Again, thank you for choosing [facility].  Our hope is that these requests will decrease the amount of time that you wait before being seen by our physicians.       _____________________________________________________________  Should you have questions after your visit to [facility], please contact our office at [phone] between the hours of 8:30 a.m. and 5:00 p.m.  Voicemails left after 4:30 p.m. will not be returned until the following business day.  For prescription refill requests, have your pharmacy contact our office with your prescription refill request.

## 2013-11-04 NOTE — Progress Notes (Signed)
[place]  OFFICE PROGRESS NOTE  [address], [place] [address]  DIAGNOSIS: Diffuse large B cell lymphoma  Bronchial asthma  Chief Complaint  Patient presents with  . Lymphoma    Presented with disease in T6, resected, postop chemotherapy.    CURRENT THERAPY: Watchful expectation.  INTERVAL HISTORY: Matthew Irwin 58 y.o. male returns for followup of diffuse large B-cell lymphoma, CD20 positive, occurring at T6 vertebral body, status post excision of a large part of this tumor pressing on the spinal cord followed by 6 cycles of R.-CHOP followed by involved field radiotherapy also receiving intrathecal chemotherapy during cycles 3 through 6 consisting of methotrexate plus Solu-Cortef with original presentation in 2010.   MEDICAL HISTORY: Past Medical History  Diagnosis Date  . Chest pain     left heart catherization  . History of cardiac catheterization   . Cardiomyopathy   . Subacute effusive constrictive pericarditis   . Hyperlipidemia     myalgias with pravastatin and simvastatin.  Marland Kitchen Anxiety   . ACEI/ARB contraindicated   . CHF (congestive heart failure)   . Obesity 08/16/2011  . NHL (non-Hodgkin's lymphoma)     history of  . HTN (hypertension)     INTERIM HISTORY: has Diffuse large B cell lymphoma; HYPERLIPIDEMIA-MIXED; CONSTRICTIVE PERICARDITIS; PERICARDIAL EFFUSION; CONGESTIVE HEART FAILURE, UNSPECIFIED; GAD (generalized anxiety disorder); Insomnia; Obesity; and Port catheter in place on his problem list.   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 He also had intrathecal chemotherapy during cycles 3, 4, 5, and 6 consisting of methotrexate and Solu-Cortef. He presented here in 2010. 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 Appetite is good with no nausea, vomiting, diarrhea, constipation, melena, hematochezia, hematuria, urinary hesitancy, lower extremity swelling or redness, peripheral paresthesias, sore throat, cough, wheezing, skin rash, headache, back pain, or seizures.  ALLERGIES:  is allergic to penicillins and simvastatin.  MEDICATIONS: has a current medication list which includes the following prescription(s): aspirin, atorvastatin, carvedilol, cetirizine, clonazepam, escitalopram, losartan, centrum silver ultra mens, fish oil, trazodone, vitamin c, and cholecalciferol.  SURGICAL HISTORY:  Past Surgical History  Procedure Laterality Date  . Left heart cath  10/10  . Nasal sinus surgery    . Hemorrhoid surgery    . Tumor removal    . Back surgery      FAMILY HISTORY: family history includes Parkinson's disease in his mother; Turner syndrome in his sister.  SOCIAL HISTORY:  reports that he has quit smoking. He has never used smokeless tobacco. He reports that he does not drink alcohol or use illicit drugs.  REVIEW OF SYSTEMS:  Other than that discussed above is noncontributory.  PHYSICAL EXAMINATION: ECOG PERFORMANCE STATUS: 0 - Asymptomatic  Blood pressure 114/70, pulse 82, temperature 97.8 F (36.6 C), temperature source Oral, resp. rate 16, weight 296 lb (134.265 kg).  GENERAL:alert, no distress and comfortable. Moderately obese. SKIN: skin color, texture, turgor are normal, no rashes or significant lesions EYES: PERLA; Conjunctiva are pink and non-injected, sclera clear OROPHARYNX:no exudate, no erythema on lips, buccal mucosa, or tongue. NECK: supple, thyroid normal size, non-tender, without nodularity. No masses CHEST: Normal AP diameter with no gynecomastia. LYMPH:  no palpable lymphadenopathy in the cervical, axillary  or inguinal LUNGS: clear to auscultation and percussion with normal breathing effort HEART: regular rate & rhythm and no  murmurs. ABDOMEN:abdomen soft, non-tender and normal bowel sounds MUSCULOSKELETAL:no cyanosis of digits and no clubbing. Range of motion normal.  NEURO: alert & oriented x 3 with fluent speech, no focal motor/sensory deficits   LABORATORY DATA: Infusion on 10/26/2013  Component Date Value Range Status  . WBC 10/26/2013 6.4  4.0 - 10.5 K/uL Final  . RBC 10/26/2013 4.53  4.22 - 5.81 MIL/uL Final  . Hemoglobin 10/26/2013 13.5  13.0 - 17.0 g/dL Final  . HCT 10/26/2013 40.2  39.0 - 52.0 % Final  . MCV 10/26/2013 88.7  78.0 - 100.0 fL Final  . MCH 10/26/2013 29.8  26.0 - 34.0 pg Final  . MCHC 10/26/2013 33.6  30.0 - 36.0 g/dL Final  . RDW 10/26/2013 13.3  11.5 - 15.5 % Final  . Platelets 10/26/2013 203  150 - 400 K/uL Final  . Neutrophils Relative % 10/26/2013 68  43 - 77 % Final  . Neutro Abs 10/26/2013 4.4  1.7 - 7.7 K/uL Final  . Lymphocytes Relative 10/26/2013 18  12 - 46 % Final  . Lymphs Abs 10/26/2013 1.1  0.7 - 4.0 K/uL Final  . Monocytes Relative 10/26/2013 10  3 - 12 % Final  . Monocytes Absolute 10/26/2013 0.7  0.1 - 1.0 K/uL Final  . Eosinophils Relative 10/26/2013 4  0 - 5 % Final  . Eosinophils Absolute 10/26/2013 0.3  0.0 - 0.7 K/uL Final  . Basophils Relative 10/26/2013 0  0 - 1 % Final  . Basophils Absolute 10/26/2013 0.0  0.0 - 0.1 K/uL Final  . Sodium 10/26/2013 140  137 - 147 mEq/L Final  . Potassium 10/26/2013 4.0  3.7 - 5.3 mEq/L Final  . Chloride 10/26/2013 99  96 - 112 mEq/L Final  . CO2 10/26/2013 30  19 - 32 mEq/L Final  . Glucose, Bld 10/26/2013 87  70 - 99 mg/dL Final  . BUN 10/26/2013 13  6 - 23 mg/dL Final  . Creatinine, Ser 10/26/2013 0.86  0.50 - 1.35 mg/dL Final  . Calcium 10/26/2013 9.6  8.4 - 10.5 mg/dL Final  . Total Protein 10/26/2013 8.1  6.0 - 8.3 g/dL Final  . Albumin 10/26/2013 4.0  3.5 - 5.2 g/dL Final  . AST 10/26/2013 49* 0 - 37 U/L Final  . ALT 10/26/2013 54* 0 - 53 U/L Final  . Alkaline Phosphatase 10/26/2013 99  39 - 117 U/L Final  .  Total Bilirubin 10/26/2013 0.6  0.3 - 1.2 mg/dL Final  . GFR calc non Af Amer 10/26/2013 >90  >90 mL/min Final  . GFR calc Af Amer 10/26/2013 >90  >90 mL/min Final   Comment: (NOTE)                          The eGFR has been calculated using the CKD EPI equation.                          This calculation has not been validated in all clinical situations.                          eGFR's persistently <90 mL/min signify possible Chronic Kidney  Disease.  Marland Kitchen LDH 10/26/2013 184  94 - 250 U/L Final    PATHOLOGY: No new pathology.  Urinalysis    Component Value Date/Time   COLORURINE YELLOW 09/05/2009 1019   APPEARANCEUR CLEAR 09/05/2009 1019   LABSPEC 1.013 09/05/2009 1019   PHURINE 6.0 09/05/2009 1019   GLUCOSEU NEGATIVE 09/05/2009 1019   HGBUR NEGATIVE 09/05/2009 1019   BILIRUBINUR NEGATIVE 09/05/2009 1019   KETONESUR NEGATIVE 09/05/2009 1019   PROTEINUR NEGATIVE 09/05/2009 1019   UROBILINOGEN 1.0 09/05/2009 1019   NITRITE NEGATIVE 09/05/2009 1019   LEUKOCYTESUR NEGATIVE MICROSCOPIC NOT DONE ON URINES WITH NEGATIVE PROTEIN, BLOOD, LEUKOCYTES, NITRITE, OR GLUCOSE <1000 mg/dL. 09/05/2009 1019    RADIOGRAPHIC STUDIES: No results found.  ASSESSMENT:  #1. Stage I-E diffuse large B-cell lymphoma, no evidence of disease #2. Hypertension, controlled. #3. Depression, well controlled.   PLAN:  #1. Continue watchful expectation. In the presence of his wife he was told to call should he develop severe fatigue, fever, or new  adenopathy. #2. Followup in 6 months with lab tests.   All questions were answered. The patient knows to call the clinic with any problems, questions or concerns. We can certainly see the patient much sooner if necessary.   I spent 25 minutes counseling the patient face to face. The total time spent in the appointment was 30 minutes.    Doroteo Bradford, MD 11/04/2013 11:11 AM

## 2013-11-10 ENCOUNTER — Other Ambulatory Visit: Payer: Self-pay | Admitting: Nurse Practitioner

## 2013-11-12 NOTE — Telephone Encounter (Signed)
ntbs for future refills 

## 2013-11-12 NOTE — Telephone Encounter (Signed)
LAST SEEN 05/14

## 2013-11-17 ENCOUNTER — Encounter: Payer: Self-pay | Admitting: Nurse Practitioner

## 2013-11-17 ENCOUNTER — Ambulatory Visit (INDEPENDENT_AMBULATORY_CARE_PROVIDER_SITE_OTHER): Payer: BC Managed Care – PPO

## 2013-11-17 ENCOUNTER — Ambulatory Visit (INDEPENDENT_AMBULATORY_CARE_PROVIDER_SITE_OTHER): Payer: BC Managed Care – PPO | Admitting: Nurse Practitioner

## 2013-11-17 VITALS — BP 117/74 | HR 70 | Temp 97.0°F | Ht 70.0 in | Wt 294.0 lb

## 2013-11-17 DIAGNOSIS — F411 Generalized anxiety disorder: Secondary | ICD-10-CM

## 2013-11-17 DIAGNOSIS — M79609 Pain in unspecified limb: Secondary | ICD-10-CM

## 2013-11-17 DIAGNOSIS — E669 Obesity, unspecified: Secondary | ICD-10-CM

## 2013-11-17 DIAGNOSIS — Z86010 Personal history of colonic polyps: Secondary | ICD-10-CM

## 2013-11-17 DIAGNOSIS — M79672 Pain in left foot: Secondary | ICD-10-CM

## 2013-11-17 DIAGNOSIS — E785 Hyperlipidemia, unspecified: Secondary | ICD-10-CM

## 2013-11-17 DIAGNOSIS — G4700 Insomnia, unspecified: Secondary | ICD-10-CM

## 2013-11-17 DIAGNOSIS — Z23 Encounter for immunization: Secondary | ICD-10-CM

## 2013-11-17 DIAGNOSIS — I10 Essential (primary) hypertension: Secondary | ICD-10-CM

## 2013-11-17 MED ORDER — CLONAZEPAM 0.5 MG PO TABS: 0.5000 mg | ORAL_TABLET | Freq: Two times a day (BID) | ORAL | Status: DC

## 2013-11-17 MED ORDER — CARVEDILOL 3.125 MG PO TABS: 3.1250 mg | ORAL_TABLET | Freq: Two times a day (BID) | ORAL | Status: DC

## 2013-11-17 MED ORDER — DICLOFENAC SODIUM 75 MG PO TBEC: 75.0000 mg | DELAYED_RELEASE_TABLET | Freq: Two times a day (BID) | ORAL | Status: DC

## 2013-11-17 MED ORDER — LOSARTAN POTASSIUM 25 MG PO TABS: 25.0000 mg | ORAL_TABLET | Freq: Every day | ORAL | Status: DC

## 2013-11-17 MED ORDER — ATORVASTATIN CALCIUM 40 MG PO TABS: 40.0000 mg | ORAL_TABLET | Freq: Every day | ORAL | Status: DC

## 2013-11-17 MED ORDER — ESCITALOPRAM OXALATE 20 MG PO TABS: 20.0000 mg | ORAL_TABLET | Freq: Every day | ORAL | Status: DC

## 2013-11-17 MED ORDER — TRAZODONE HCL 100 MG PO TABS: 100.0000 mg | ORAL_TABLET | Freq: Every day | ORAL | Status: DC

## 2013-11-17 NOTE — Progress Notes (Signed)
Subjective:    Patient ID: Matthew Irwin, male    DOB: 12/19/1955, 58 y.o.   MRN: [id number]  HPI Pt presents today for chronic medical follow up.  C/o left heel pain that has been intermittent for 2-3 months.  Pain is described as aggravating and intensity ranges from 5-9/10.  Aggravated by ambulating and relieved by new shoes, rest.  Otherwise, no new complaints.   Subjective:    Patient ID: Matthew Irwin, male    DOB: 12-24-1955, 58 y.o.   MRN: [id number]  Hyperlipidemia This is a chronic problem. The current episode started more than 1 year ago. The problem is controlled. Recent lipid tests were reviewed and are normal. Exacerbating diseases include obesity. He has no history of diabetes. There are no known factors aggravating his hyperlipidemia. Pertinent negatives include no chest pain, focal weakness, myalgias or shortness of breath. Current antihyperlipidemic treatment includes statins. The current treatment provides significant improvement of lipids. Compliance problems include adherence to diet and adherence to exercise.  Risk factors for coronary artery disease include hypertension, male sex and obesity.  Hypertension This is a chronic problem. The current episode started more than 1 year ago. The problem has been resolved since onset. The problem is controlled. Associated symptoms include blurred vision. Pertinent negatives include no chest pain, headaches, neck pain, palpitations, peripheral edema or shortness of breath. There are no associated agents to hypertension. Risk factors for coronary artery disease include dyslipidemia, obesity, male gender and sedentary lifestyle. Past treatments include angiotensin blockers and beta blockers. The current treatment provides moderate improvement. Compliance problems include diet and exercise.   Depression Lexapro and clonazepam- Working well- No c/o side effects    Review of Systems  HENT: Negative for neck pain.   Eyes: Positive for blurred  vision.  Respiratory: Negative for shortness of breath.   Cardiovascular: Negative for chest pain and palpitations.  Musculoskeletal: Negative for myalgias.  Neurological: Negative for focal weakness and headaches.       Objective:   Assessment & Plan:     Review of Systems  Constitutional: Negative for fever, chills and fatigue.  HENT: Negative for congestion and rhinorrhea.   Respiratory: Positive for shortness of breath (pt states he has been short of breath intermittently since open heart surgery).   Cardiovascular: Negative for chest pain, palpitations and leg swelling.  Gastrointestinal: Negative for diarrhea, constipation and blood in stool.  Genitourinary: Negative for frequency.  Musculoskeletal: Negative for back pain and joint swelling.       Point tenderness over left heel when foot dorsiflexed  Skin: Negative for rash.  Neurological: Negative for headaches.  All other systems reviewed and are negative.  BP 117/74  Pulse 70  Temp(Src) 97 F (36.1 C) (Oral)  Ht 5' 10" (1.778 m)  Wt 294 lb (133.358 kg)  BMI 42.18 kg/m2  lefft heel xray- heel spur-Preliminary reading by Ronnald Collum, FNP  [facility]     Objective:   Physical Exam        Assessment & Plan:   1. Need for prophylactic vaccination and inoculation against influenza   2. Obesity   3. Insomnia   4. HYPERLIPIDEMIA-MIXED   5. GAD (generalized anxiety disorder)   6. Hypertension   7. Pain of left heel   8. Hx of colonic polyps    Orders Placed This Encounter  Procedures  . DG Foot Complete Left    Standing Status: Future     Number of Occurrences:  Standing Expiration Date: 01/17/2015    Order Specific Question:  Reason for Exam (SYMPTOM  OR DIAGNOSIS REQUIRED)    Answer:  left heel pain    Order Specific Question:  Preferred imaging location?    Answer:  Internal  . CMP14+EGFR  . NMR, lipoprofile  . Ambulatory referral to Gastroenterology    Referral Priority:  Routine    Referral  Type:  Consultation    Referral Reason:  Specialty Services Required    Requested Specialty:  Gastroenterology    Number of Visits Requested:  1  . POCT respiratory syncytial virus   Meds ordered this encounter  Medications  . traZODone (DESYREL) 100 MG tablet    Sig: Take 1 tablet (100 mg total) by mouth at bedtime.    Dispense:  30 tablet    Refill:  5  . losartan (COZAAR) 25 MG tablet    Sig: Take 1 tablet (25 mg total) by mouth daily.    Dispense:  30 tablet    Refill:  11  . escitalopram (LEXAPRO) 20 MG tablet    Sig: Take 1 tablet (20 mg total) by mouth daily.    Dispense:  30 tablet    Refill:  5  . clonazePAM (KLONOPIN) 0.5 MG tablet    Sig: Take 1 tablet (0.5 mg total) by mouth 2 (two) times daily.    Dispense:  60 tablet    Refill:  1  . carvedilol (COREG) 3.125 MG tablet    Sig: Take 1 tablet (3.125 mg total) by mouth 2 (two) times daily with a meal.    Dispense:  60 tablet    Refill:  5  . atorvastatin (LIPITOR) 40 MG tablet    Sig: Take 1 tablet (40 mg total) by mouth daily at 6 PM.    Dispense:  30 tablet    Refill:  5  Follow up with cardiologist Labs pending Health maintenance reviewed Diet and exercise encouraged Continue all meds Follow up  In 3 months   Mary-Margaret Martin, FNP    

## 2013-11-17 NOTE — Addendum Note (Signed)
Addended by: Pollyann Kennedy F on: 11/17/2013 04:51 PM   Modules accepted: Orders

## 2013-11-17 NOTE — Patient Instructions (Signed)
Heel Spur A heel spur is a hook of bone that can form on the calcaneus (the heel bone and the largest bone of the foot). Heel spurs are often associated with plantar fasciitis and usually come in people who have had the problem for an extended period of time. The cause of the relationship is unknown. The pain associated with them is thought to be caused by an inflammation (soreness and redness) of the plantar fascia rather than the spur itself. The plantar fascia is a thick fibrous like tissue that runs from the calcaneus (heel bone) to the ball of the foot. This strong, tight tissue helps maintain the arch of your foot. It helps distribute the weight across your foot as you walk or run. Stresses placed on the plantar fascia can be tremendous. When it is inflamed normal activities become painful. Pain is worse in the morning after sleeping. After sleeping the plantar fascia is tight. The first movements stretch the fascia and this causes pain. As the tendon loosens, the pain usually gets better. It often returns with too much standing or walking.  About 70% of patients with plantar fasciitis have a heel spur. About half of people without foot pain also have heel spurs. DIAGNOSIS  The diagnosis of a heel spur is made by X-ray. The X-ray shows a hook of bone protruding from the bottom of the calcaneus at the point where the plantar fascia is attached to the heel bone.  TREATMENT  It is necessary to find out what is causing the stretching of the plantar fascia. If the cause is over-pronation (flat feet), orthotics and proper foot ware may help.  Stretching exercises, losing weight, wearing shoes that have a cushioned heel that absorbs shock, and elevating the heel with the use of a heel cradle, heel cup, or orthotics may all help. Heel cradles and heel cups provide extra comfort and cushion to the heel, and reduce the amount of shock to the sore area. AVOIDING THE PAIN OF PLANTAR FASCIITIS AND HEEL  SPURS  Consult a sports medicine professional before beginning a new exercise program.  Walking programs offer a good workout. There is a lower chance of overuse injuries common to the runners. There is less impact and less jarring of the joints.  Begin all new exercise programs slowly. If problems or pains develop, decrease the amount of time or distance until you are at a comfortable level.  Wear good shoes and replace them regularly.  Stretch your foot and the heel cords at the back of the ankle (Achilles tendons) both before and after exercise.  Run or exercise on even surfaces that are not hard. For example, asphalt is better than pavement.  Do not run barefoot on hard surfaces.  If using a treadmill, vary the incline.  Do not continue to workout if you have foot or joint problems. Seek professional help if they do not improve. HOME CARE INSTRUCTIONS   Avoid activities that cause you pain until you recover.  Use ice or cold packs to the problem or painful areas after working out.  Only take over-the-counter or prescription medicines for pain, discomfort, or fever as directed by your caregiver.  Soft shoe inserts or athletic shoes with air or gel sole cushions may be helpful.  If problems continue or become more severe, consult a sports medicine caregiver. Cortisone is a potent anti-inflammatory medication that may be injected into the painful area. You can discuss this treatment with your caregiver. MAKE SURE YOU:  Understand these instructions.  Will watch your condition.  Will get help right away if you are not doing well or get worse. Document Released: 11/13/2005 Document Revised: 12/30/2011 Document Reviewed: 01/15/2006 [facility] Patient Information 2014 [place].

## 2013-11-18 LAB — CMP14+EGFR
ALK PHOS: 104 IU/L (ref 39–117)
ALT: 50 IU/L — AB (ref 0–44)
AST: 43 IU/L — ABNORMAL HIGH (ref 0–40)
Albumin/Globulin Ratio: 1.7 (ref 1.1–2.5)
Albumin: 4.6 g/dL (ref 3.5–5.5)
BUN / CREAT RATIO: 14 (ref 9–20)
BUN: 11 mg/dL (ref 6–24)
CHLORIDE: 99 mmol/L (ref 97–108)
CO2: 26 mmol/L (ref 18–29)
Calcium: 9.6 mg/dL (ref 8.7–10.2)
Creatinine, Ser: 0.81 mg/dL (ref 0.76–1.27)
GFR calc Af Amer: 115 mL/min/{1.73_m2} (ref >59)
GFR calc non Af Amer: 99 mL/min/{1.73_m2} (ref >59)
Globulin, Total: 2.7 g/dL (ref 1.5–4.5)
Glucose: 87 mg/dL (ref 65–99)
POTASSIUM: 4.3 mmol/L (ref 3.5–5.2)
Sodium: 141 mmol/L (ref 134–144)
Total Bilirubin: 0.6 mg/dL (ref 0.0–1.2)
Total Protein: 7.3 g/dL (ref 6.0–8.5)

## 2013-11-18 LAB — NMR, LIPOPROFILE
CHOLESTEROL: 128 mg/dL (ref <200)
HDL Cholesterol by NMR: 34 mg/dL — ABNORMAL LOW (ref >=40)
HDL Particle Number: 26.2 umol/L — ABNORMAL LOW (ref >=30.5)
LDL PARTICLE NUMBER: 958 nmol/L (ref <1000)
LDL SIZE: 20 nm — AB (ref >20.5)
LDLC SERPL CALC-MCNC: 67 mg/dL (ref <100)
LP-IR Score: 63 — ABNORMAL HIGH (ref <=45)
Small LDL Particle Number: 639 nmol/L — ABNORMAL HIGH (ref <=527)
Triglycerides by NMR: 136 mg/dL (ref <150)

## 2013-11-26 ENCOUNTER — Encounter: Payer: Self-pay | Admitting: Internal Medicine

## 2013-12-14 ENCOUNTER — Other Ambulatory Visit (HOSPITAL_COMMUNITY): Payer: BC Managed Care – PPO

## 2013-12-17 ENCOUNTER — Encounter (HOSPITAL_COMMUNITY): Payer: BC Managed Care – PPO | Attending: Oncology

## 2013-12-17 ENCOUNTER — Other Ambulatory Visit (HOSPITAL_COMMUNITY): Payer: BC Managed Care – PPO

## 2013-12-17 DIAGNOSIS — Z95828 Presence of other vascular implants and grafts: Secondary | ICD-10-CM

## 2013-12-17 DIAGNOSIS — C8589 Other specified types of non-Hodgkin lymphoma, extranodal and solid organ sites: Secondary | ICD-10-CM

## 2013-12-17 DIAGNOSIS — Z9889 Other specified postprocedural states: Secondary | ICD-10-CM | POA: Insufficient documentation

## 2013-12-17 DIAGNOSIS — Z452 Encounter for adjustment and management of vascular access device: Secondary | ICD-10-CM

## 2013-12-17 MED ORDER — SODIUM CHLORIDE 0.9 % IJ SOLN
10.0000 mL | INTRAMUSCULAR | Status: DC | PRN
  Administered 2013-12-17: 10 mL via INTRAVENOUS

## 2013-12-17 MED ORDER — HEPARIN SOD (PORK) LOCK FLUSH 100 UNIT/ML IV SOLN
500.0000 [IU] | Freq: Once | INTRAVENOUS | Status: AC
  Administered 2013-12-17: 500 [IU] via INTRAVENOUS

## 2013-12-17 MED ORDER — HEPARIN SOD (PORK) LOCK FLUSH 100 UNIT/ML IV SOLN
INTRAVENOUS | Status: AC
  Filled 2013-12-17: qty 5

## 2013-12-17 NOTE — Progress Notes (Signed)
Robie E Lagrand presented for Portacath access and flush. Proper placement of portacath confirmed by CXR. Portacath located left chest wall accessed with  H 20 needle. Good blood return present. Portacath flushed with 20ml NS and 500U/5ml Heparin and needle removed intact. Procedure without incident. Patient tolerated procedure well.   

## 2013-12-27 ENCOUNTER — Ambulatory Visit (AMBULATORY_SURGERY_CENTER): Payer: Self-pay | Admitting: *Deleted

## 2013-12-27 ENCOUNTER — Telehealth: Payer: Self-pay | Admitting: *Deleted

## 2013-12-27 ENCOUNTER — Encounter: Payer: Self-pay | Admitting: Internal Medicine

## 2013-12-27 VITALS — Ht 70.0 in | Wt 298.8 lb

## 2013-12-27 DIAGNOSIS — Z1211 Encounter for screening for malignant neoplasm of colon: Secondary | ICD-10-CM

## 2013-12-27 MED ORDER — MOVIPREP 100 G PO SOLR: ORAL | Status: DC

## 2013-12-27 NOTE — Telephone Encounter (Signed)
Patient is direct for screening colonoscopy on 01/10/14. Patient states last colonoscopy was done in 2004 by Dr.Henry Haywood Lasso. Patient states he had 1 polyp removed. Release of information form filled out and signed by patient. Form given to [employer].

## 2013-12-27 NOTE — Telephone Encounter (Signed)
Noted  

## 2013-12-27 NOTE — Progress Notes (Signed)
Patient denies any allergies to eggs or soy. Patient denies any problems with anesthesia. Patient release of information form signed by patient and given to Charlett Lango. Patient states last colonoscopy was around 2004 by Dr.Henry Lindalou Hose, [place],. 1 polyp removed.

## 2014-01-10 ENCOUNTER — Encounter: Payer: BC Managed Care – PPO | Admitting: Internal Medicine

## 2014-01-25 ENCOUNTER — Encounter (HOSPITAL_COMMUNITY): Payer: BC Managed Care – PPO | Attending: Oncology

## 2014-01-25 DIAGNOSIS — Z9889 Other specified postprocedural states: Secondary | ICD-10-CM | POA: Insufficient documentation

## 2014-01-25 DIAGNOSIS — Z452 Encounter for adjustment and management of vascular access device: Secondary | ICD-10-CM

## 2014-01-25 DIAGNOSIS — C8589 Other specified types of non-Hodgkin lymphoma, extranodal and solid organ sites: Secondary | ICD-10-CM

## 2014-01-25 MED ORDER — HEPARIN SOD (PORK) LOCK FLUSH 100 UNIT/ML IV SOLN
500.0000 [IU] | Freq: Once | INTRAVENOUS | Status: AC
  Administered 2014-01-25: 500 [IU] via INTRAVENOUS

## 2014-01-25 MED ORDER — HEPARIN SOD (PORK) LOCK FLUSH 100 UNIT/ML IV SOLN
INTRAVENOUS | Status: AC
  Filled 2014-01-25: qty 5

## 2014-01-25 MED ORDER — SODIUM CHLORIDE 0.9 % IJ SOLN
10.0000 mL | INTRAMUSCULAR | Status: DC | PRN
  Administered 2014-01-25: 10 mL via INTRAVENOUS

## 2014-01-25 NOTE — Progress Notes (Signed)
Matthew Irwin presented for Portacath access and flush. Portacath located left chest wall accessed with  H 20 needle. Good blood return present. Portacath flushed with 30ml NS and 500U/34ml Heparin and needle removed intact. Procedure without incident. Patient tolerated procedure well.

## 2014-03-08 ENCOUNTER — Encounter (HOSPITAL_COMMUNITY): Payer: BC Managed Care – PPO | Attending: Oncology

## 2014-03-08 DIAGNOSIS — Z95828 Presence of other vascular implants and grafts: Secondary | ICD-10-CM

## 2014-03-08 DIAGNOSIS — Z87898 Personal history of other specified conditions: Secondary | ICD-10-CM

## 2014-03-08 DIAGNOSIS — Z9889 Other specified postprocedural states: Secondary | ICD-10-CM | POA: Insufficient documentation

## 2014-03-08 DIAGNOSIS — Z452 Encounter for adjustment and management of vascular access device: Secondary | ICD-10-CM

## 2014-03-08 MED ORDER — HEPARIN SOD (PORK) LOCK FLUSH 100 UNIT/ML IV SOLN
INTRAVENOUS | Status: AC
  Filled 2014-03-08: qty 5

## 2014-03-08 MED ORDER — SODIUM CHLORIDE 0.9 % IJ SOLN
10.0000 mL | INTRAMUSCULAR | Status: DC | PRN
  Administered 2014-03-08: 10 mL via INTRAVENOUS

## 2014-03-08 MED ORDER — HEPARIN SOD (PORK) LOCK FLUSH 100 UNIT/ML IV SOLN
500.0000 [IU] | Freq: Once | INTRAVENOUS | Status: AC
  Administered 2014-03-08: 500 [IU] via INTRAVENOUS

## 2014-03-08 NOTE — Progress Notes (Signed)
Samule E Kilner presented for Portacath access and flush. Proper placement of portacath confirmed by CXR. Portacath located lt  chest wall accessed with  H 20 needle. Good blood return present. Portacath flushed with 20ml NS and 500U/5ml Heparin and needle removed intact. Procedure without incident. Patient tolerated procedure well.   

## 2014-04-08 ENCOUNTER — Other Ambulatory Visit: Payer: Self-pay | Admitting: Nurse Practitioner

## 2014-04-11 NOTE — Telephone Encounter (Signed)
Med called to pharm 

## 2014-04-11 NOTE — Telephone Encounter (Signed)
Patient NTBS for follow up and lab work Please call in klonopin with 1 refills

## 2014-04-19 ENCOUNTER — Encounter (HOSPITAL_COMMUNITY): Payer: BC Managed Care – PPO | Attending: Oncology

## 2014-04-19 DIAGNOSIS — C8589 Other specified types of non-Hodgkin lymphoma, extranodal and solid organ sites: Secondary | ICD-10-CM | POA: Insufficient documentation

## 2014-04-19 DIAGNOSIS — C8330 Diffuse large B-cell lymphoma, unspecified site: Secondary | ICD-10-CM

## 2014-04-19 LAB — COMPREHENSIVE METABOLIC PANEL
ALBUMIN: 3.5 g/dL (ref 3.5–5.2)
ALT: 48 U/L (ref 0–53)
AST: 42 U/L — ABNORMAL HIGH (ref 0–37)
Alkaline Phosphatase: 95 U/L (ref 39–117)
BILIRUBIN TOTAL: 0.5 mg/dL (ref 0.3–1.2)
BUN: 13 mg/dL (ref 6–23)
CHLORIDE: 100 meq/L (ref 96–112)
CO2: 27 meq/L (ref 19–32)
CREATININE: 0.85 mg/dL (ref 0.50–1.35)
Calcium: 8.8 mg/dL (ref 8.4–10.5)
GFR calc Af Amer: >90 mL/min (ref >90)
GFR calc non Af Amer: >90 mL/min (ref >90)
Glucose, Bld: 122 mg/dL — ABNORMAL HIGH (ref 70–99)
POTASSIUM: 3.6 meq/L — AB (ref 3.7–5.3)
Sodium: 139 mEq/L (ref 137–147)
Total Protein: 7.5 g/dL (ref 6.0–8.3)

## 2014-04-19 LAB — CBC WITH DIFFERENTIAL/PLATELET
BASOS PCT: 0 % (ref 0–1)
Basophils Absolute: 0 10*3/uL (ref 0.0–0.1)
Eosinophils Absolute: 0.3 10*3/uL (ref 0.0–0.7)
Eosinophils Relative: 5 % (ref 0–5)
HEMATOCRIT: 38.8 % — AB (ref 39.0–52.0)
Hemoglobin: 13.4 g/dL (ref 13.0–17.0)
Lymphocytes Relative: 22 % (ref 12–46)
Lymphs Abs: 1.2 10*3/uL (ref 0.7–4.0)
MCH: 31.2 pg (ref 26.0–34.0)
MCHC: 34.5 g/dL (ref 30.0–36.0)
MCV: 90.2 fL (ref 78.0–100.0)
MONO ABS: 0.6 10*3/uL (ref 0.1–1.0)
MONOS PCT: 11 % (ref 3–12)
NEUTROS ABS: 3.4 10*3/uL (ref 1.7–7.7)
Neutrophils Relative %: 62 % (ref 43–77)
Platelets: 159 10*3/uL (ref 150–400)
RBC: 4.3 MIL/uL (ref 4.22–5.81)
RDW: 13 % (ref 11.5–15.5)
WBC: 5.5 10*3/uL (ref 4.0–10.5)

## 2014-04-19 LAB — LACTATE DEHYDROGENASE: LDH: 217 U/L (ref 94–250)

## 2014-04-19 MED ORDER — HEPARIN SOD (PORK) LOCK FLUSH 100 UNIT/ML IV SOLN
INTRAVENOUS | Status: AC
  Filled 2014-04-19: qty 5

## 2014-04-19 MED ORDER — SODIUM CHLORIDE 0.9 % IJ SOLN
10.0000 mL | INTRAMUSCULAR | Status: DC | PRN
  Administered 2014-04-19: 10 mL via INTRAVENOUS

## 2014-04-19 MED ORDER — HEPARIN SOD (PORK) LOCK FLUSH 100 UNIT/ML IV SOLN
500.0000 [IU] | Freq: Once | INTRAVENOUS | Status: AC
  Administered 2014-04-19: 500 [IU] via INTRAVENOUS

## 2014-04-19 NOTE — Progress Notes (Signed)
Matthew Irwin presented for Portacath access and flush.   Portacath located left chest wall accessed with  H 20 needle.  Good blood return present. Portacath flushed with 20ml NS and 500U/5ml Heparin and needle removed intact.  Procedure tolerated well and without incident.   

## 2014-05-04 ENCOUNTER — Ambulatory Visit (HOSPITAL_COMMUNITY): Payer: BC Managed Care – PPO

## 2014-05-04 ENCOUNTER — Encounter (HOSPITAL_COMMUNITY): Payer: Self-pay

## 2014-05-04 ENCOUNTER — Encounter (HOSPITAL_COMMUNITY): Payer: BC Managed Care – PPO | Attending: Oncology

## 2014-05-04 VITALS — BP 110/77 | HR 67 | Temp 98.3°F | Resp 18 | Wt 298.6 lb

## 2014-05-04 DIAGNOSIS — I509 Heart failure, unspecified: Secondary | ICD-10-CM

## 2014-05-04 DIAGNOSIS — I10 Essential (primary) hypertension: Secondary | ICD-10-CM

## 2014-05-04 DIAGNOSIS — F329 Major depressive disorder, single episode, unspecified: Secondary | ICD-10-CM

## 2014-05-04 DIAGNOSIS — Z87898 Personal history of other specified conditions: Secondary | ICD-10-CM

## 2014-05-04 DIAGNOSIS — C8330 Diffuse large B-cell lymphoma, unspecified site: Secondary | ICD-10-CM

## 2014-05-04 DIAGNOSIS — C8589 Other specified types of non-Hodgkin lymphoma, extranodal and solid organ sites: Secondary | ICD-10-CM | POA: Insufficient documentation

## 2014-05-04 DIAGNOSIS — F3289 Other specified depressive episodes: Secondary | ICD-10-CM

## 2014-05-04 NOTE — Patient Instructions (Signed)
[facility] Discharge Instructions  RECOMMENDATIONS MADE BY THE CONSULTANT AND ANY TEST RESULTS WILL BE SENT TO YOUR REFERRING PHYSICIAN.  EXAM FINDINGS BY THE PHYSICIAN TODAY AND SIGNS OR SYMPTOMS TO REPORT TO CLINIC OR PRIMARY PHYSICIAN: Exam and findings as discussed by Dr. Barnet Glasgow.  If you decide you want to get your port out let us know and we will refer you back to Dr. Arnoldo Morale.  Report fevers, night sweats, unexplained weight loss or other concerns.    INSTRUCTIONS/FOLLOW-UP: Follow-up in 6 months with blood work and office visit.  Thank you for choosing [facility] to provide your oncology and hematology care.  To afford each patient quality time with our providers, please arrive at least 15 minutes before your scheduled appointment time.  With your help, our goal is to use those 15 minutes to complete the necessary work-up to ensure our physicians have the information they need to help with your evaluation and healthcare recommendations.    Effective January 1st, 2014, we ask that you re-schedule your appointment with our physicians should you arrive 10 or more minutes late for your appointment.  We strive to give you quality time with our providers, and arriving late affects you and other patients whose appointments are after yours.    Again, thank you for choosing [facility]  Our hope is that these requests will decrease the amount of time that you wait before being seen by our physicians.       _____________________________________________________________  Should you have questions after your visit to [facility], please contact our office at [phone] between the hours of 8:30 a.m. and 4:30 p.m.  Voicemails left after 4:30 p.m. will not be returned until the following business day.  For prescription refill requests, have your pharmacy contact our office with your prescription refill request.     _______________________________________________________________  We hope that we have given you very good care.  You may receive a patient satisfaction survey in the mail, please complete it and return it as soon as possible.  We value your feedback!  _______________________________________________________________  Have you asked about our STAR program?  STAR stands for Survivorship Training and Rehabilitation, and this is a nationally recognized cancer care program that focuses on survivorship and rehabilitation.  Cancer and cancer treatments may cause problems, such as, pain, making you feel tired and keeping you from doing the things that you need or want to do. Cancer rehabilitation can help. Our goal is to reduce these troubling effects and help you have the best quality of life possible.  You may receive a survey from a nurse that asks questions about your current state of health.  Based on the survey results, all eligible patients will be referred to the [facility] program for an evaluation so we can better serve you!  A frequently asked questions sheet is available upon request.

## 2014-05-04 NOTE — Progress Notes (Signed)
Matthew Irwin  OFFICE PROGRESS NOTE  Matthew Irwin, Matthew Irwin [id number]  DIAGNOSIS: Diffuse large B cell lymphoma  No chief complaint on file.   CURRENT THERAPY: Watchful expectation and surveillance.  INTERVAL HISTORY: Matthew Irwin 58 y.o. 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  He continues to do well with no fever, night sweats, increasing fatigue, but does get occasional numbness involving the left thumb which is relieved by wearing a brace below his left elbow. He denies any back pain, nausea, vomiting, diarrhea, constipation, lower extremity swelling or redness, chest pain, PND, orthopnea, skin rash, headache, or seizures.  MEDICAL HISTORY: Past Medical History  Diagnosis Date  . Chest pain     left heart catherization  . History of cardiac catheterization   . Cardiomyopathy   . Subacute effusive constrictive pericarditis   . Hyperlipidemia     myalgias with pravastatin and simvastatin.  Marland Kitchen Anxiety   . ACEI/ARB contraindicated   . CHF (congestive heart failure)   . Obesity 08/16/2011  . NHL (non-Hodgkin's lymphoma)     history of  . HTN (hypertension)     INTERIM HISTORY: has Diffuse large B cell lymphoma; HYPERLIPIDEMIA-MIXED; CONSTRICTIVE PERICARDITIS; PERICARDIAL EFFUSION; CONGESTIVE HEART FAILURE, UNSPECIFIED; GAD (generalized anxiety disorder); Insomnia; Obesity; Port catheter in place; and Hypertension on his problem list.   Stage I E. diffuse large B-cell lymphoma, CD20 positive, occurring at the T6 vertebral body status post excision of a large part of  this tumor pressing on the spinal cord followed by 6 cycles of R.-CHOP followed by involved field radiation therapy. He also had intrathecal chemotherapy during cycles 3, 4, 5, and 6 consisting of methotrexate and Solu-Cortef. He presented here in 2010.  ALLERGIES:  is allergic to penicillins and simvastatin.  MEDICATIONS: has a current medication list which includes the following prescription(s): aspirin, atorvastatin, carvedilol, cetirizine, cholecalciferol, clonazepam, diclofenac, escitalopram, losartan, moviprep, centrum silver ultra mens, fish oil, trazodone, and vitamin c.  SURGICAL HISTORY:  Past Surgical History  Procedure Laterality Date  . Left heart cath  10/10  . Nasal sinus surgery    . Hemorrhoid surgery    . Tumor removal    . Back surgery  07/2008    tumor removed off spine  . Portacath placement    . Heart sack removed  07/2009    FAMILY HISTORY: family history includes Parkinson's disease in his mother; Turner syndrome in his sister. There is no history of Colon cancer.  SOCIAL HISTORY:  reports that he has quit smoking. He has never used smokeless tobacco. He reports that he does not drink alcohol or use illicit drugs.  REVIEW OF SYSTEMS:  Other than that discussed above is noncontributory.  PHYSICAL EXAMINATION: ECOG PERFORMANCE STATUS: 1 - Symptomatic but completely ambulatory  There were no vitals taken for this visit.  GENERAL:alert, no distress and comfortable SKIN: skin color, texture, turgor are normal, no rashes or significant lesions EYES: PERLA; Conjunctiva are pink and non-injected, sclera clear SINUSES: No redness or tenderness over maxillary or ethmoid sinuses OROPHARYNX:no exudate, no erythema on lips, buccal mucosa, or tongue.  NECK: supple, thyroid normal size, non-tender, without nodularity. No masses CHEST: Normal AP diameter with light port in place. LYMPH:  no palpable lymphadenopathy in the cervical, axillary or inguinal LUNGS: clear to  auscultation and percussion with normal breathing effort HEART: regular rate & rhythm and no murmurs. ABDOMEN:abdomen soft, non-tender and normal bowel sounds MUSCULOSKELETAL:no cyanosis of digits and no clubbing. Range of motion normal.  NEURO: alert & oriented x 3 with fluent speech, no focal motor/sensory deficits   LABORATORY DATA: Infusion on 04/19/2014  Component Date Value Ref Range Status  . WBC 04/19/2014 5.5  4.0 - 10.5 K/uL Final  . RBC 04/19/2014 4.30  4.22 - 5.81 MIL/uL Final  . Hemoglobin 04/19/2014 13.4  13.0 - 17.0 g/dL Final  . HCT 04/19/2014 38.8* 39.0 - 52.0 % Final  . MCV 04/19/2014 90.2  78.0 - 100.0 fL Final  . MCH 04/19/2014 31.2  26.0 - 34.0 pg Final  . MCHC 04/19/2014 34.5  30.0 - 36.0 g/dL Final  . RDW 04/19/2014 13.0  11.5 - 15.5 % Final  . Platelets 04/19/2014 159  150 - 400 K/uL Final  . Neutrophils Relative % 04/19/2014 62  43 - 77 % Final  . Neutro Abs 04/19/2014 3.4  1.7 - 7.7 K/uL Final  . Lymphocytes Relative 04/19/2014 22  12 - 46 % Final  . Lymphs Abs 04/19/2014 1.2  0.7 - 4.0 K/uL Final  . Monocytes Relative 04/19/2014 11  3 - 12 % Final  . Monocytes Absolute 04/19/2014 0.6  0.1 - 1.0 K/uL Final  . Eosinophils Relative 04/19/2014 5  0 - 5 % Final  . Eosinophils Absolute 04/19/2014 0.3  0.0 - 0.7 K/uL Final  . Basophils Relative 04/19/2014 0  0 - 1 % Final  . Basophils Absolute 04/19/2014 0.0  0.0 - 0.1 K/uL Final  . Sodium 04/19/2014 139  137 - 147 mEq/L Final  . Potassium 04/19/2014 3.6* 3.7 - 5.3 mEq/L Final  . Chloride 04/19/2014 100  96 - 112 mEq/L Final  . CO2 04/19/2014 27  19 - 32 mEq/L Final  . Glucose, Bld 04/19/2014 122* 70 - 99 mg/dL Final  . BUN 04/19/2014 13  6 - 23 mg/dL Final  . Creatinine, Ser 04/19/2014 0.85  0.50 - 1.35 mg/dL Final  . Calcium 04/19/2014 8.8  8.4 - 10.5 mg/dL Final  . Total Protein 04/19/2014 7.5  6.0 - 8.3 g/dL Final  . Albumin 04/19/2014 3.5  3.5 - 5.2 g/dL Final  . AST 04/19/2014 42* 0 - 37 U/L Final    SLIGHT HEMOLYSIS  . ALT 04/19/2014 48  0 - 53 U/L Final  . Alkaline Phosphatase 04/19/2014 95  39 - 117 U/L Final  . Total Bilirubin 04/19/2014 0.5  0.3 - 1.2 mg/dL Final  . GFR calc non Af Amer 04/19/2014 >90  >90 mL/min Final  . GFR calc Af Amer 04/19/2014 >90  >90 mL/min Final   Comment: (NOTE)                          The eGFR has been calculated using the CKD EPI equation.                          This calculation has not been validated in all clinical situations.                          eGFR's  persistently <90 mL/min signify possible Chronic Kidney                          Disease.  Marland Kitchen LDH 04/19/2014 217  94 - 250 U/L Final   SLIGHT HEMOLYSIS    PATHOLOGY: No new pathology.  Urinalysis    Component Value Date/Time   COLORURINE YELLOW 09/05/2009 1019   APPEARANCEUR CLEAR 09/05/2009 1019   LABSPEC 1.013 09/05/2009 1019   PHURINE 6.0 09/05/2009 1019   GLUCOSEU NEGATIVE 09/05/2009 1019   HGBUR NEGATIVE 09/05/2009 1019   BILIRUBINUR NEGATIVE 09/05/2009 1019   KETONESUR NEGATIVE 09/05/2009 1019   PROTEINUR NEGATIVE 09/05/2009 1019   UROBILINOGEN 1.0 09/05/2009 1019   NITRITE NEGATIVE 09/05/2009 1019   LEUKOCYTESUR NEGATIVE MICROSCOPIC NOT DONE ON URINES WITH NEGATIVE PROTEIN, BLOOD, LEUKOCYTES, NITRITE, OR GLUCOSE <1000 mg/dL. 09/05/2009 1019    RADIOGRAPHIC STUDIES: No results found.  ASSESSMENT:  #1. Stage I-E diffuse large B-cell lymphoma, no evidence of disease  #2. Hypertension, controlled.  #3. Depression, well controlled    PLAN:  #1. Continue watchful expectation. In the presence of his wife, he was told to call should he develop severe fatigue, fever, new adenopathy, or easy satiety. #2. He expresses desire to remove his life port with which I have no objection. Dr. Arnoldo Morale originally placed it. He will call back with his final decision. Port flush every 6 weeks. #3. Followup in 6 months with CBC, reticulocyte count chem profile, beta 2 microglobulin, and  LDH.   All questions were answered. The patient knows to call the clinic with any problems, questions or concerns. We can certainly see the patient much sooner if necessary.   I spent 25 minutes counseling the patient face to face. The total time spent in the appointment was 30 minutes.    Doroteo Bradford, MD 05/04/2014 9:08 AM  DISCLAIMER:  This note was dictated with voice recognition software.  Similar sounding words can inadvertently be transcribed inaccurately and may not be corrected upon review.

## 2014-05-12 ENCOUNTER — Other Ambulatory Visit: Payer: Self-pay | Admitting: Nurse Practitioner

## 2014-05-13 NOTE — Telephone Encounter (Signed)
Patient last seen in office on 11-17-13. Please advise

## 2014-05-16 NOTE — Telephone Encounter (Signed)
Patient NTBS for follow up and lab work  

## 2014-06-01 ENCOUNTER — Encounter (HOSPITAL_COMMUNITY): Payer: BC Managed Care – PPO

## 2014-06-02 ENCOUNTER — Encounter (HOSPITAL_COMMUNITY): Payer: BC Managed Care – PPO | Attending: Oncology

## 2014-06-02 DIAGNOSIS — Z87898 Personal history of other specified conditions: Secondary | ICD-10-CM

## 2014-06-02 DIAGNOSIS — Z452 Encounter for adjustment and management of vascular access device: Secondary | ICD-10-CM

## 2014-06-02 DIAGNOSIS — C8589 Other specified types of non-Hodgkin lymphoma, extranodal and solid organ sites: Secondary | ICD-10-CM | POA: Insufficient documentation

## 2014-06-02 DIAGNOSIS — Z95828 Presence of other vascular implants and grafts: Secondary | ICD-10-CM

## 2014-06-02 MED ORDER — SODIUM CHLORIDE 0.9 % IJ SOLN
10.0000 mL | Freq: Once | INTRAMUSCULAR | Status: AC
  Administered 2014-06-02: 10 mL via INTRAVENOUS

## 2014-06-02 MED ORDER — HEPARIN SOD (PORK) LOCK FLUSH 100 UNIT/ML IV SOLN
500.0000 [IU] | Freq: Once | INTRAVENOUS | Status: AC
  Administered 2014-06-02: 500 [IU] via INTRAVENOUS

## 2014-06-02 NOTE — Progress Notes (Signed)
Matthew Irwin presented for Portacath access and flush. Proper placement of portacath confirmed by CXR. Portacath located left chest wall accessed with  H 20 needle. Good blood return present. Portacath flushed with 20ml NS and 500U/5ml Heparin and needle removed intact. Procedure without incident. Patient tolerated procedure well.   

## 2014-06-13 ENCOUNTER — Encounter: Payer: Self-pay | Admitting: Nurse Practitioner

## 2014-06-14 ENCOUNTER — Other Ambulatory Visit: Payer: Self-pay | Admitting: Nurse Practitioner

## 2014-06-14 MED ORDER — ZOLPIDEM TARTRATE 10 MG PO TABS: 10.0000 mg | ORAL_TABLET | Freq: Every evening | ORAL | Status: DC | PRN

## 2014-06-14 NOTE — Progress Notes (Signed)
Called to [facility] per MMM.

## 2014-06-21 ENCOUNTER — Other Ambulatory Visit: Payer: Self-pay | Admitting: Nurse Practitioner

## 2014-06-25 ENCOUNTER — Other Ambulatory Visit: Payer: Self-pay | Admitting: Nurse Practitioner

## 2014-06-27 NOTE — Telephone Encounter (Signed)
Last seen 11/17/13, last filled 05/16/14

## 2014-07-12 ENCOUNTER — Encounter (HOSPITAL_COMMUNITY): Payer: BC Managed Care – PPO | Attending: Oncology

## 2014-07-12 DIAGNOSIS — Z23 Encounter for immunization: Secondary | ICD-10-CM

## 2014-07-12 DIAGNOSIS — Z95828 Presence of other vascular implants and grafts: Secondary | ICD-10-CM

## 2014-07-12 DIAGNOSIS — C8589 Other specified types of non-Hodgkin lymphoma, extranodal and solid organ sites: Secondary | ICD-10-CM | POA: Insufficient documentation

## 2014-07-12 MED ORDER — INFLUENZA VAC SPLIT QUAD 0.5 ML IM SUSY
PREFILLED_SYRINGE | INTRAMUSCULAR | Status: AC
  Filled 2014-07-12: qty 0.5

## 2014-07-12 MED ORDER — INFLUENZA VAC SPLIT QUAD 0.5 ML IM SUSY
0.5000 mL | PREFILLED_SYRINGE | Freq: Once | INTRAMUSCULAR | Status: AC
  Administered 2014-07-12: 0.5 mL via INTRAMUSCULAR

## 2014-07-12 MED ORDER — HEPARIN SOD (PORK) LOCK FLUSH 100 UNIT/ML IV SOLN
INTRAVENOUS | Status: AC
  Filled 2014-07-12: qty 5

## 2014-07-12 MED ORDER — SODIUM CHLORIDE 0.9 % IJ SOLN
10.0000 mL | INTRAMUSCULAR | Status: DC | PRN
  Administered 2014-07-12: 10 mL via INTRAVENOUS

## 2014-07-12 MED ORDER — HEPARIN SOD (PORK) LOCK FLUSH 100 UNIT/ML IV SOLN
500.0000 [IU] | Freq: Once | INTRAVENOUS | Status: AC
  Administered 2014-07-12: 500 [IU] via INTRAVENOUS

## 2014-07-12 NOTE — Progress Notes (Signed)
Patient given flu shot to left deltoid pt. Tolerated well. Matthew Irwin presented for Portacath access and flush.  .  Portacath located left chest wall accessed with  H 20 needle.  Good blood return present. Portacath flushed with 16ml NS and 500U/2ml Heparin and needle removed intact.  Procedure tolerated well and without incident.

## 2014-07-13 ENCOUNTER — Encounter: Payer: Self-pay | Admitting: Nurse Practitioner

## 2014-07-13 ENCOUNTER — Encounter (HOSPITAL_COMMUNITY): Payer: BC Managed Care – PPO

## 2014-07-13 ENCOUNTER — Ambulatory Visit (INDEPENDENT_AMBULATORY_CARE_PROVIDER_SITE_OTHER): Payer: BC Managed Care – PPO | Admitting: Nurse Practitioner

## 2014-07-13 VITALS — BP 128/81 | HR 61 | Temp 97.0°F | Ht 70.0 in | Wt 303.4 lb

## 2014-07-13 DIAGNOSIS — G4700 Insomnia, unspecified: Secondary | ICD-10-CM

## 2014-07-13 DIAGNOSIS — E669 Obesity, unspecified: Secondary | ICD-10-CM

## 2014-07-13 DIAGNOSIS — F411 Generalized anxiety disorder: Secondary | ICD-10-CM

## 2014-07-13 DIAGNOSIS — Z9109 Other allergy status, other than to drugs and biological substances: Secondary | ICD-10-CM

## 2014-07-13 DIAGNOSIS — I10 Essential (primary) hypertension: Secondary | ICD-10-CM

## 2014-07-13 DIAGNOSIS — R635 Abnormal weight gain: Secondary | ICD-10-CM

## 2014-07-13 DIAGNOSIS — E785 Hyperlipidemia, unspecified: Secondary | ICD-10-CM

## 2014-07-13 MED ORDER — CETIRIZINE HCL 10 MG PO TABS: ORAL_TABLET | ORAL | Status: DC

## 2014-07-13 MED ORDER — LOSARTAN POTASSIUM 25 MG PO TABS: 25.0000 mg | ORAL_TABLET | Freq: Every day | ORAL | Status: DC

## 2014-07-13 MED ORDER — CLONAZEPAM 0.5 MG PO TABS: ORAL_TABLET | ORAL | Status: DC

## 2014-07-13 MED ORDER — ESCITALOPRAM OXALATE 20 MG PO TABS: ORAL_TABLET | ORAL | Status: DC

## 2014-07-13 MED ORDER — ATORVASTATIN CALCIUM 40 MG PO TABS: ORAL_TABLET | ORAL | Status: DC

## 2014-07-13 MED ORDER — TRAZODONE HCL 100 MG PO TABS: ORAL_TABLET | ORAL | Status: DC

## 2014-07-13 MED ORDER — CARVEDILOL 3.125 MG PO TABS: 3.1250 mg | ORAL_TABLET | Freq: Two times a day (BID) | ORAL | Status: DC

## 2014-07-13 NOTE — Progress Notes (Signed)
Subjective:    Patient ID: Matthew Irwin, male    DOB: 1956-01-31, 58 y.o.   MRN: [id number]  Patient is here today for chronic disease follow up.  Hyperlipidemia This is a chronic problem. The current episode started more than 1 year ago. The problem is controlled. Recent lipid tests were reviewed and are normal. Exacerbating diseases include obesity. He has no history of diabetes. There are no known factors aggravating his hyperlipidemia. Pertinent negatives include no chest pain, focal weakness, myalgias or shortness of breath. Current antihyperlipidemic treatment includes statins. The current treatment provides significant improvement of lipids. Compliance problems include adherence to diet and adherence to exercise.  Risk factors for coronary artery disease include hypertension, male sex and obesity.  Hypertension This is a chronic problem. The current episode started more than 1 year ago. The problem has been resolved since onset. The problem is controlled. Associated symptoms include blurred vision. Pertinent negatives include no chest pain, headaches, neck pain, palpitations, peripheral edema or shortness of breath. There are no associated agents to hypertension. Risk factors for coronary artery disease include dyslipidemia, obesity, male gender and sedentary lifestyle. Past treatments include angiotensin blockers and beta blockers. The current treatment provides moderate improvement. Compliance problems include diet and exercise.   Depression Lexapro and clonazepam- Working well- No c/o side effects Insomnia:   Patient is trazodone 166m HS.     Review of Systems  Eyes: Positive for blurred vision.  Respiratory: Negative for shortness of breath.   Cardiovascular: Negative for chest pain and palpitations.  Musculoskeletal: Negative for myalgias and neck pain.  Neurological: Negative for focal weakness and headaches.       Objective:   Physical Exam  Constitutional: He is oriented to  person, place, and time. He appears well-developed and well-nourished.  HENT:  Head: Normocephalic.  Right Ear: External ear normal.  Left Ear: External ear normal.  Nose: Nose normal.  Mouth/Throat: Oropharynx is clear and moist.  Eyes: EOM are normal. Pupils are equal, round, and reactive to light.  Neck: Normal range of motion. Neck supple. No thyromegaly present.  Cardiovascular: Normal rate, regular rhythm, normal heart sounds and intact distal pulses.   No murmur heard. Pulmonary/Chest: Effort normal and breath sounds normal. He has no wheezes. He has no rales.  Abdominal: Soft. Bowel sounds are normal.  Genitourinary: Prostate normal and penis normal.  Musculoskeletal: Normal range of motion.  Neurological: He is alert and oriented to person, place, and time.  Skin: Skin is warm and dry.  Psychiatric: He has a normal mood and affect. His behavior is normal. Judgment and thought content normal.   BP 128/81  Pulse 61  Temp(Src) 97 F (36.1 C) (Oral)  Ht 5' 10"  (1.778 m)  Wt 303 lb 6.4 oz (137.621 kg)  BMI 43.53 kg/m2        Assessment & Plan:    1. Obesity Discussed diet and exercise for person with BMI >25 Will recheck weight in 3-6 months  2. Essential hypertension Low NA diet - CMP14+EGFR - losartan (COZAAR) 25 MG tablet; Take 1 tablet (25 mg total) by mouth daily.  Dispense: 30 tablet; Refill: 11 - carvedilol (COREG) 3.125 MG tablet; Take 1 tablet (3.125 mg total) by mouth 2 (two) times daily with a meal.  Dispense: 60 tablet; Refill: 5  3. HYPERLIPIDEMIA-MIXED Low fat diet - NMR, lipoprofile - atorvastatin (LIPITOR) 40 MG tablet; Take 1 tablet (40 mg total) by mouth daily at 6 PM.  Dispense: 30 tablet; Refill:  5  4. GAD (generalized anxiety disorder) Stress management - escitalopram (LEXAPRO) 20 MG tablet; TAKE 1 TABLET DAILY  Dispense: 30 tablet; Refill: 5 - clonazePAM (KLONOPIN) 0.5 MG tablet; TAKE  (1)  TABLET TWICE A DAY.  Dispense: 60 tablet; Refill:  1  5. Insomnia Bedtime ritual - traZODone (DESYREL) 100 MG tablet; TAKE ONE TABLET AT BEDTIME  Dispense: 30 tablet; Refill: 5  6. Environmental allergies - cetirizine (ZYRTEC) 10 MG tablet; TAKE 1 TABLET DAILY  Dispense: 30 tablet; Refill: 5    Labs pending Health maintenance reviewed Diet and exercise encouraged Continue all meds Follow up  In 3 months   [place], FNP

## 2014-07-13 NOTE — Patient Instructions (Signed)

## 2014-07-14 LAB — NMR, LIPOPROFILE
Cholesterol: 144 mg/dL (ref 100–199)
HDL Cholesterol by NMR: 31 mg/dL — ABNORMAL LOW (ref >39)
HDL Particle Number: 25.9 umol/L — ABNORMAL LOW (ref >=30.5)
LDL PARTICLE NUMBER: 806 nmol/L (ref <1000)
LDL SIZE: 20.2 nm (ref >20.5)
LDLC SERPL CALC-MCNC: 65 mg/dL (ref 0–99)
LP-IR SCORE: 84 — AB (ref <=45)
SMALL LDL PARTICLE NUMBER: 478 nmol/L (ref <=527)
Triglycerides by NMR: 240 mg/dL — ABNORMAL HIGH (ref 0–149)

## 2014-07-14 LAB — CMP14+EGFR
A/G RATIO: 1.4 (ref 1.1–2.5)
ALK PHOS: 94 IU/L (ref 39–117)
ALT: 59 IU/L — ABNORMAL HIGH (ref 0–44)
AST: 51 IU/L — AB (ref 0–40)
Albumin: 4.3 g/dL (ref 3.5–5.5)
BUN / CREAT RATIO: 12 (ref 9–20)
BUN: 11 mg/dL (ref 6–24)
CHLORIDE: 98 mmol/L (ref 97–108)
CO2: 26 mmol/L (ref 18–29)
Calcium: 9.4 mg/dL (ref 8.7–10.2)
Creatinine, Ser: 0.9 mg/dL (ref 0.76–1.27)
GFR calc non Af Amer: 94 mL/min/{1.73_m2} (ref >59)
GFR, EST AFRICAN AMERICAN: 109 mL/min/{1.73_m2} (ref >59)
GLUCOSE: 79 mg/dL (ref 65–99)
Globulin, Total: 3 g/dL (ref 1.5–4.5)
POTASSIUM: 4.3 mmol/L (ref 3.5–5.2)
SODIUM: 139 mmol/L (ref 134–144)
TOTAL PROTEIN: 7.3 g/dL (ref 6.0–8.5)
Total Bilirubin: 0.6 mg/dL (ref 0.0–1.2)

## 2014-08-05 ENCOUNTER — Other Ambulatory Visit: Payer: Self-pay | Admitting: Nurse Practitioner

## 2014-08-08 NOTE — Telephone Encounter (Signed)
Patient last seen in office on 07-13-14. Rx last filled on 06-06-14 for #[id number]. Please advise

## 2014-08-08 NOTE — Telephone Encounter (Signed)
Please call in klonopin with 1 refills 

## 2014-08-24 ENCOUNTER — Encounter (HOSPITAL_COMMUNITY): Payer: BC Managed Care – PPO | Attending: Oncology

## 2014-08-24 VITALS — BP 135/73 | HR 66 | Temp 98.1°F | Resp 18

## 2014-08-24 DIAGNOSIS — Z8579 Personal history of other malignant neoplasms of lymphoid, hematopoietic and related tissues: Secondary | ICD-10-CM

## 2014-08-24 DIAGNOSIS — Z452 Encounter for adjustment and management of vascular access device: Secondary | ICD-10-CM

## 2014-08-24 DIAGNOSIS — Z95828 Presence of other vascular implants and grafts: Secondary | ICD-10-CM

## 2014-08-24 DIAGNOSIS — Z9889 Other specified postprocedural states: Secondary | ICD-10-CM | POA: Insufficient documentation

## 2014-08-24 MED ORDER — HEPARIN SOD (PORK) LOCK FLUSH 100 UNIT/ML IV SOLN
INTRAVENOUS | Status: AC
  Filled 2014-08-24: qty 5

## 2014-08-24 MED ORDER — HEPARIN SOD (PORK) LOCK FLUSH 100 UNIT/ML IV SOLN
500.0000 [IU] | Freq: Once | INTRAVENOUS | Status: AC
  Administered 2014-08-24: 500 [IU] via INTRAVENOUS

## 2014-08-24 MED ORDER — SODIUM CHLORIDE 0.9 % IJ SOLN
10.0000 mL | INTRAMUSCULAR | Status: DC | PRN
  Administered 2014-08-24: 10 mL via INTRAVENOUS
  Filled 2014-08-24: qty 10

## 2014-08-24 NOTE — Progress Notes (Signed)
Matthew Irwin presented for Portacath access and flush.   Portacath located left chest wall accessed with  H 20 needle.  Good blood return present. Portacath flushed with 20ml NS and 500U/5ml Heparin and needle removed intact.  Procedure tolerated well and without incident.   

## 2014-08-24 NOTE — Patient Instructions (Signed)
[facility] Discharge Instructions  RECOMMENDATIONS MADE BY THE CONSULTANT AND ANY TEST RESULTS WILL BE SENT TO YOUR REFERRING PHYSICIAN.  Your port was flushed today. Please follow up as scheduled. Call for any questions or concerns.   Thank you for choosing [facility] to provide your oncology and hematology care.  To afford each patient quality time with our providers, please arrive at least 15 minutes before your scheduled appointment time.  With your help, our goal is to use those 15 minutes to complete the necessary work-up to ensure our physicians have the information they need to help with your evaluation and healthcare recommendations.    Effective January 1st, 2014, we ask that you re-schedule your appointment with our physicians should you arrive 10 or more minutes late for your appointment.  We strive to give you quality time with our providers, and arriving late affects you and other patients whose appointments are after yours.    Again, thank you for choosing [facility].  Our hope is that these requests will decrease the amount of time that you wait before being seen by our physicians.       _____________________________________________________________  Should you have questions after your visit to [facility], please contact our office at [phone] between the hours of 8:30 a.m. and 4:30 p.m.  Voicemails left after 4:30 p.m. will not be returned until the following business day.  For prescription refill requests, have your pharmacy contact our office with your prescription refill request.    _______________________________________________________________  We hope that we have given you very good care.  You may receive a patient satisfaction survey in the mail, please complete it and return it as soon as possible.  We value your feedback!  _______________________________________________________________  Have you  asked about our STAR program?  STAR stands for Survivorship Training and Rehabilitation, and this is a nationally recognized cancer care program that focuses on survivorship and rehabilitation.  Cancer and cancer treatments may cause problems, such as, pain, making you feel tired and keeping you from doing the things that you need or want to do. Cancer rehabilitation can help. Our goal is to reduce these troubling effects and help you have the best quality of life possible.  You may receive a survey from a nurse that asks questions about your current state of health.  Based on the survey results, all eligible patients will be referred to the [facility] program for an evaluation so we can better serve you!  A frequently asked questions sheet is available upon request.

## 2014-10-05 ENCOUNTER — Encounter (HOSPITAL_COMMUNITY): Payer: BC Managed Care – PPO

## 2014-10-11 ENCOUNTER — Encounter: Payer: Self-pay | Admitting: Nurse Practitioner

## 2014-10-11 ENCOUNTER — Encounter (HOSPITAL_COMMUNITY): Payer: BC Managed Care – PPO

## 2014-10-11 ENCOUNTER — Ambulatory Visit (INDEPENDENT_AMBULATORY_CARE_PROVIDER_SITE_OTHER): Payer: BC Managed Care – PPO | Admitting: Nurse Practitioner

## 2014-10-11 VITALS — BP 111/78 | HR 70 | Temp 97.6°F | Ht 70.0 in | Wt 300.0 lb

## 2014-10-11 DIAGNOSIS — J0100 Acute maxillary sinusitis, unspecified: Secondary | ICD-10-CM

## 2014-10-11 DIAGNOSIS — J209 Acute bronchitis, unspecified: Secondary | ICD-10-CM

## 2014-10-11 MED ORDER — AZITHROMYCIN 250 MG PO TABS: ORAL_TABLET | ORAL | Status: DC

## 2014-10-11 MED ORDER — BENZONATATE 100 MG PO CAPS: 100.0000 mg | ORAL_CAPSULE | Freq: Two times a day (BID) | ORAL | Status: DC | PRN

## 2014-10-11 NOTE — Patient Instructions (Signed)

## 2014-10-11 NOTE — Progress Notes (Signed)
Subjective:    Patient ID: GABINO HAGIN, male    DOB: 1956-05-18, 58 y.o.   MRN: [id number]  HPI Patient in today c/o cough and congestion. Started several days ago and is nit getting any better.    Review of Systems  Constitutional: Negative for fever and chills.  HENT: Positive for congestion and rhinorrhea. Negative for ear pain, sore throat, trouble swallowing and voice change.   Respiratory: Positive for cough.   Cardiovascular: Negative.   Gastrointestinal: Negative.   Genitourinary: Negative.   Neurological: Negative.   Psychiatric/Behavioral: Negative.   All other systems reviewed and are negative.      Objective:   Physical Exam  Constitutional: He is oriented to person, place, and time. He appears well-developed and well-nourished.  HENT:  Right Ear: Hearing, tympanic membrane, external ear and ear canal normal.  Left Ear: Hearing, tympanic membrane, external ear and ear canal normal.  Nose: Mucosal edema and rhinorrhea present. Right sinus exhibits maxillary sinus tenderness. Right sinus exhibits no frontal sinus tenderness. Left sinus exhibits maxillary sinus tenderness. Left sinus exhibits no frontal sinus tenderness.  Mouth/Throat: Uvula is midline, oropharynx is clear and moist and mucous membranes are normal.  Eyes: Pupils are equal, round, and reactive to light.  Neck: Normal range of motion.  Cardiovascular: Normal rate, regular rhythm and normal heart sounds.   Pulmonary/Chest: Effort normal and breath sounds normal.  Lymphadenopathy:    He has no cervical adenopathy.  Neurological: He is alert and oriented to person, place, and time.  Skin: Skin is warm and dry.  Psychiatric: He has a normal mood and affect. His behavior is normal. Judgment and thought content normal.   BP 111/78 mmHg  Pulse 70  Temp(Src) 97.6 F (36.4 C) (Oral)  Ht 5\' 10"  (1.778 m)  Wt 300 lb (136.079 kg)  BMI 43.05 kg/m2        Assessment & Plan:   1. Acute bronchitis,  unspecified organism   2. Acute maxillary sinusitis, recurrence not specified    Meds ordered this encounter  Medications  . azithromycin (ZITHROMAX Z-PAK) 250 MG tablet    Sig: As directed    Dispense:  6 each    Refill:  0    Order Specific Question:  Supervising Provider    Answer:  Chipper Herb [[id number]]  . benzonatate (TESSALON) 100 MG capsule    Sig: Take 1 capsule (100 mg total) by mouth 2 (two) times daily as needed for cough.    Dispense:  20 capsule    Refill:  0    Order Specific Question:  Supervising Provider    Answer:  Chipper Herb [[id number]]   1. Take meds as prescribed 2. Use a cool mist humidifier especially during the winter months and when heat has been humid. 3. Use saline nose sprays frequently 4. Saline irrigations of the nose can be very helpful if done frequently.  * 4X daily for 1 week*  * Use of a nettie pot can be helpful with this. Follow directions with this* 5. Drink plenty of fluids 6. Keep thermostat turn down low 7.For any cough or congestion  Use plain Mucinex- regular strength or max strength is fine   * Children- consult with Pharmacist for dosing 8. For fever or aces or pains- take tylenol or ibuprofen appropriate for age and weight.  * for fevers greater than 101 orally you may alternate ibuprofen and tylenol every  3 hours.   Mary-Margaret Hassell Done, FNP

## 2014-10-12 ENCOUNTER — Other Ambulatory Visit (HOSPITAL_COMMUNITY): Payer: Self-pay

## 2014-10-12 DIAGNOSIS — C8330 Diffuse large B-cell lymphoma, unspecified site: Secondary | ICD-10-CM

## 2014-10-19 ENCOUNTER — Encounter: Payer: Self-pay | Admitting: Nurse Practitioner

## 2014-10-19 ENCOUNTER — Ambulatory Visit (INDEPENDENT_AMBULATORY_CARE_PROVIDER_SITE_OTHER): Payer: BC Managed Care – PPO | Admitting: Nurse Practitioner

## 2014-10-19 ENCOUNTER — Ambulatory Visit (INDEPENDENT_AMBULATORY_CARE_PROVIDER_SITE_OTHER): Payer: BC Managed Care – PPO

## 2014-10-19 VITALS — BP 100/72 | HR 83 | Temp 97.5°F | Ht 70.0 in | Wt 299.0 lb

## 2014-10-19 DIAGNOSIS — I509 Heart failure, unspecified: Secondary | ICD-10-CM

## 2014-10-19 DIAGNOSIS — E785 Hyperlipidemia, unspecified: Secondary | ICD-10-CM

## 2014-10-19 DIAGNOSIS — I10 Essential (primary) hypertension: Secondary | ICD-10-CM

## 2014-10-19 DIAGNOSIS — G4700 Insomnia, unspecified: Secondary | ICD-10-CM

## 2014-10-19 DIAGNOSIS — E669 Obesity, unspecified: Secondary | ICD-10-CM

## 2014-10-19 DIAGNOSIS — Z125 Encounter for screening for malignant neoplasm of prostate: Secondary | ICD-10-CM

## 2014-10-19 DIAGNOSIS — R635 Abnormal weight gain: Secondary | ICD-10-CM

## 2014-10-19 DIAGNOSIS — F411 Generalized anxiety disorder: Secondary | ICD-10-CM

## 2014-10-19 MED ORDER — CLONAZEPAM 0.5 MG PO TABS: ORAL_TABLET | ORAL | Status: DC

## 2014-10-19 NOTE — Progress Notes (Signed)
Subjective:    Patient ID: Matthew Irwin, male    DOB: [birth year], 57 y.o.   MRN: [id number]   Patient here today for follow up of chronic medical problems. Patient getting over a cold- finished zithromax.   Hyperlipidemia This is a chronic problem. The problem is uncontrolled. Recent lipid tests were reviewed and are high. Exacerbating diseases include obesity. He has no history of diabetes or hypothyroidism. Pertinent negatives include no chest pain, myalgias or shortness of breath. Current antihyperlipidemic treatment includes statins. The current treatment provides moderate improvement of lipids. Compliance problems include adherence to diet and adherence to exercise.  Risk factors for coronary artery disease include dyslipidemia, hypertension, male sex and obesity.  Hypertension This is a chronic problem. The current episode started more than 1 year ago. The problem is controlled. Pertinent negatives include no chest pain, headaches, neck pain, palpitations or shortness of breath. Risk factors for coronary artery disease include dyslipidemia, family history, male gender and obesity. Past treatments include angiotensin blockers. The current treatment provides significant improvement. Compliance problems include diet and exercise.   Depression Lexapro and clonazepam- Working well- No c/o side effects insomnia trazadone helps him sleep at night. Feels rested in AM  Review of Systems  Respiratory: Negative for shortness of breath.   Cardiovascular: Negative for chest pain and palpitations.  Musculoskeletal: Negative for myalgias and neck pain.  Neurological: Negative for headaches.       Objective:   Physical Exam  Constitutional: He is oriented to person, place, and time. He appears well-developed and well-nourished.  HENT:  Head: Normocephalic.  Right Ear: External ear normal.  Left Ear: External ear normal.  Nose: Nose normal.  Mouth/Throat: Oropharynx is clear and moist.  Eyes:  EOM are normal. Pupils are equal, round, and reactive to light.  Neck: Normal range of motion. Neck supple. No JVD present. No thyromegaly present.  Cardiovascular: Normal rate, regular rhythm, normal heart sounds and intact distal pulses.  Exam reveals no gallop and no friction rub.   No murmur heard. Pulmonary/Chest: Effort normal and breath sounds normal. No respiratory distress. He has no wheezes. He has no rales. He exhibits no tenderness.  Abdominal: Soft. Bowel sounds are normal. He exhibits no mass. There is no tenderness.  Musculoskeletal: Normal range of motion. He exhibits no edema.  Lymphadenopathy:    He has no cervical adenopathy.  Neurological: He is alert and oriented to person, place, and time. No cranial nerve deficit.  Skin: Skin is warm and dry.  Psychiatric: He has a normal mood and affect. His behavior is normal. Judgment and thought content normal.   BP 100/72 mmHg  Pulse 83  Temp(Src) 97.5 F (36.4 C) (Oral)  Ht 5' 10"  (1.778 m)  Wt 299 lb (135.626 kg)  BMI 42.90 kg/m2  EKG- NSR-Matthew Hassell Done, FNP   Chest x ray- decreased lung expansion-Preliminary reading by Ronnald Collum, FNP  [facility]       Assessment & Plan:  1. Congestive heart failure, unspecified congestive heart failure chronicity, unspecified congestive heart failure type  2. Essential hypertension Do not add salt to diet - CMP14+EGFR - DG Chest 2 View; Future - EKG 12-Lead  3. GAD (generalized anxiety disorder) Stress management - clonazePAM (KLONOPIN) 0.5 MG tablet; TAKE  (1)  TABLET TWICE A DAY.  Dispense: 60 tablet; Refill: 2  4. Hyperlipidemia with target LDL less than 100 Watch fat in diet - NMR, lipoprofile  5. Insomnia Bedtime ritual  6. Obesity  7. Prostate cancer  screening - PSA, total and free  8. Severe obesity (BMI >= 40) Discussed diet and exercise for person with BMI >25 Will recheck weight in 3-6 months     Labs pending Health maintenance reviewed Diet  and exercise encouraged Continue all meds Follow up  In 3 month   [place], FNP

## 2014-10-19 NOTE — Patient Instructions (Signed)

## 2014-10-20 LAB — CMP14+EGFR
A/G RATIO: 1.3 (ref 1.1–2.5)
ALBUMIN: 4.3 g/dL (ref 3.5–5.5)
ALT: 62 IU/L — ABNORMAL HIGH (ref 0–44)
AST: 49 IU/L — ABNORMAL HIGH (ref 0–40)
Alkaline Phosphatase: 94 IU/L (ref 39–117)
BUN / CREAT RATIO: 11 (ref 9–20)
BUN: 11 mg/dL (ref 6–24)
CALCIUM: 9.2 mg/dL (ref 8.7–10.2)
CO2: 27 mmol/L (ref 18–29)
CREATININE: 0.99 mg/dL (ref 0.76–1.27)
Chloride: 100 mmol/L (ref 97–108)
GFR calc Af Amer: 97 mL/min/{1.73_m2} (ref >59)
GFR, EST NON AFRICAN AMERICAN: 84 mL/min/{1.73_m2} (ref >59)
Globulin, Total: 3.4 g/dL (ref 1.5–4.5)
Glucose: 95 mg/dL (ref 65–99)
Potassium: 3.9 mmol/L (ref 3.5–5.2)
SODIUM: 141 mmol/L (ref 134–144)
Total Bilirubin: 0.6 mg/dL (ref 0.0–1.2)
Total Protein: 7.7 g/dL (ref 6.0–8.5)

## 2014-10-20 LAB — NMR, LIPOPROFILE
Cholesterol: 130 mg/dL (ref 100–199)
HDL Cholesterol by NMR: 30 mg/dL — ABNORMAL LOW (ref >39)
HDL PARTICLE NUMBER: 25.4 umol/L — AB (ref >=30.5)
LDL Particle Number: 791 nmol/L (ref <1000)
LDL Size: 20.3 nm (ref >20.5)
LDL-C: 61 mg/dL (ref 0–99)
LP-IR Score: 65 — ABNORMAL HIGH (ref <=45)
Small LDL Particle Number: 433 nmol/L (ref <=527)
TRIGLYCERIDES BY NMR: 194 mg/dL — AB (ref 0–149)

## 2014-10-20 LAB — PSA, TOTAL AND FREE
PSA FREE PCT: 27.5 %
PSA, Free: 0.11 ng/mL
PSA: 0.4 ng/mL (ref 0.0–4.0)

## 2014-10-26 ENCOUNTER — Encounter (HOSPITAL_COMMUNITY): Payer: 59 | Attending: Hematology & Oncology

## 2014-10-26 DIAGNOSIS — Z923 Personal history of irradiation: Secondary | ICD-10-CM | POA: Diagnosis not present

## 2014-10-26 DIAGNOSIS — Z08 Encounter for follow-up examination after completed treatment for malignant neoplasm: Secondary | ICD-10-CM | POA: Diagnosis not present

## 2014-10-26 DIAGNOSIS — Z9221 Personal history of antineoplastic chemotherapy: Secondary | ICD-10-CM | POA: Insufficient documentation

## 2014-10-26 DIAGNOSIS — C8330 Diffuse large B-cell lymphoma, unspecified site: Secondary | ICD-10-CM

## 2014-10-26 DIAGNOSIS — Z95828 Presence of other vascular implants and grafts: Secondary | ICD-10-CM

## 2014-10-26 DIAGNOSIS — Z8579 Personal history of other malignant neoplasms of lymphoid, hematopoietic and related tissues: Secondary | ICD-10-CM | POA: Diagnosis not present

## 2014-10-26 DIAGNOSIS — Z452 Encounter for adjustment and management of vascular access device: Secondary | ICD-10-CM

## 2014-10-26 LAB — CBC WITH DIFFERENTIAL/PLATELET
Basophils Absolute: 0 10*3/uL (ref 0.0–0.1)
Basophils Relative: 0 % (ref 0–1)
Eosinophils Absolute: 0.3 10*3/uL (ref 0.0–0.7)
Eosinophils Relative: 5 % (ref 0–5)
HCT: 42.3 % (ref 39.0–52.0)
Hemoglobin: 13.8 g/dL (ref 13.0–17.0)
Lymphocytes Relative: 24 % (ref 12–46)
Lymphs Abs: 1.3 10*3/uL (ref 0.7–4.0)
MCH: 29.8 pg (ref 26.0–34.0)
MCHC: 32.6 g/dL (ref 30.0–36.0)
MCV: 91.4 fL (ref 78.0–100.0)
MONO ABS: 0.8 10*3/uL (ref 0.1–1.0)
Monocytes Relative: 15 % — ABNORMAL HIGH (ref 3–12)
NEUTROS ABS: 3.1 10*3/uL (ref 1.7–7.7)
Neutrophils Relative %: 56 % (ref 43–77)
Platelets: 161 10*3/uL (ref 150–400)
RBC: 4.63 MIL/uL (ref 4.22–5.81)
RDW: 12.8 % (ref 11.5–15.5)
WBC: 5.5 10*3/uL (ref 4.0–10.5)

## 2014-10-26 LAB — COMPREHENSIVE METABOLIC PANEL
ALK PHOS: 87 U/L (ref 39–117)
ALT: 57 U/L — ABNORMAL HIGH (ref 0–53)
ANION GAP: 6 (ref 5–15)
AST: 48 U/L — ABNORMAL HIGH (ref 0–37)
Albumin: 4.1 g/dL (ref 3.5–5.2)
BUN: 9 mg/dL (ref 6–23)
CALCIUM: 9 mg/dL (ref 8.4–10.5)
CO2: 31 mmol/L (ref 19–32)
Chloride: 102 mEq/L (ref 96–112)
Creatinine, Ser: 0.83 mg/dL (ref 0.50–1.35)
Glucose, Bld: 97 mg/dL (ref 70–99)
POTASSIUM: 3.8 mmol/L (ref 3.5–5.1)
SODIUM: 139 mmol/L (ref 135–145)
Total Bilirubin: 0.7 mg/dL (ref 0.3–1.2)
Total Protein: 7.6 g/dL (ref 6.0–8.3)

## 2014-10-26 LAB — RETICULOCYTES
RBC.: 4.63 MIL/uL (ref 4.22–5.81)
RETIC CT PCT: 1.6 % (ref 0.4–3.1)
Retic Count, Absolute: 74.1 10*3/uL (ref 19.0–186.0)

## 2014-10-26 LAB — LACTATE DEHYDROGENASE: LDH: 167 U/L (ref 94–250)

## 2014-10-26 MED ORDER — SODIUM CHLORIDE 0.9 % IJ SOLN
10.0000 mL | Freq: Once | INTRAMUSCULAR | Status: AC
  Administered 2014-10-26: 10 mL via INTRAVENOUS

## 2014-10-26 MED ORDER — HEPARIN SOD (PORK) LOCK FLUSH 100 UNIT/ML IV SOLN
500.0000 [IU] | Freq: Once | INTRAVENOUS | Status: AC
  Administered 2014-10-26: 500 [IU] via INTRAVENOUS

## 2014-10-26 MED ORDER — HEPARIN SOD (PORK) LOCK FLUSH 100 UNIT/ML IV SOLN
INTRAVENOUS | Status: AC
  Filled 2014-10-26: qty 5

## 2014-10-26 NOTE — Progress Notes (Signed)
Matthew Irwin presented for Portacath access and flush. Proper placement of portacath confirmed by CXR. Portacath located right chest wall accessed with  H 20 needle. Good blood return present. Portacath flushed with 36ml NS and 500U/35ml Heparin and needle removed intact. Procedure without incident. Patient tolerated procedure well.

## 2014-10-26 NOTE — Patient Instructions (Signed)
[facility] Discharge Instructions  RECOMMENDATIONS MADE BY THE CONSULTANT AND ANY TEST RESULTS WILL BE SENT TO YOUR REFERRING PHYSICIAN.  INSTRUCTIONS/FOLLOW-UP: Port flush with lab work today. We will call you if there are any unexpected results. Return as scheduled.  Thank you for choosing [facility] to provide your oncology and hematology care.  To afford each patient quality time with our providers, please arrive at least 15 minutes before your scheduled appointment time.  With your help, our goal is to use those 15 minutes to complete the necessary work-up to ensure our physicians have the information they need to help with your evaluation and healthcare recommendations.    Effective January 1st, 2014, we ask that you re-schedule your appointment with our physicians should you arrive 10 or more minutes late for your appointment.  We strive to give you quality time with our providers, and arriving late affects you and other patients whose appointments are after yours.    Again, thank you for choosing [facility].  Our hope is that these requests will decrease the amount of time that you wait before being seen by our physicians.       _____________________________________________________________  Should you have questions after your visit to [facility], please contact our office at [phone] between the hours of 8:30 a.m. and 4:30 p.m.  Voicemails left after 4:30 p.m. will not be returned until the following business day.  For prescription refill requests, have your pharmacy contact our office with your prescription refill request.    _______________________________________________________________  We hope that we have given you very good care.  You may receive a patient satisfaction survey in the mail, please complete it and return it as soon as possible.  We value your  feedback!  _______________________________________________________________  Have you asked about our STAR program?  STAR stands for Survivorship Training and Rehabilitation, and this is a nationally recognized cancer care program that focuses on survivorship and rehabilitation.  Cancer and cancer treatments may cause problems, such as, pain, making you feel tired and keeping you from doing the things that you need or want to do. Cancer rehabilitation can help. Our goal is to reduce these troubling effects and help you have the best quality of life possible.  You may receive a survey from a nurse that asks questions about your current state of health.  Based on the survey results, all eligible patients will be referred to the [facility] program for an evaluation so we can better serve you!  A frequently asked questions sheet is available upon request.

## 2014-10-28 ENCOUNTER — Telehealth: Payer: Self-pay | Admitting: Nurse Practitioner

## 2014-10-28 LAB — BETA 2 MICROGLOBULIN, SERUM: Beta-2 Microglobulin: 2.42 mg/L — ABNORMAL HIGH (ref <=2.51)

## 2014-10-28 MED ORDER — BENZONATATE 100 MG PO CAPS: 100.0000 mg | ORAL_CAPSULE | Freq: Two times a day (BID) | ORAL | Status: DC | PRN

## 2014-10-28 NOTE — Telephone Encounter (Signed)
Pt aware.

## 2014-10-28 NOTE — Telephone Encounter (Signed)
Is okay to refill Tessalon Perles 1

## 2014-10-28 NOTE — Telephone Encounter (Signed)
Last script for this medicine sent in 10-11-14 and had office visit also.

## 2014-10-31 NOTE — Assessment & Plan Note (Signed)
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  Labs reviewed and are solid from an oncology standpoint.  Repeat labs in 12 months: CBC diff, CMET, LDH, ESR, B2M.  Return in 12 months for follow-up per NCCN guidelines recommendation for surveillance.

## 2014-10-31 NOTE — Progress Notes (Signed)
Matthew Irwin, Matthew Irwin [id number]  Diffuse large B cell lymphoma  CURRENT THERAPY: Surveillance per NCCN guidelines  INTERVAL HISTORY: Matthew Irwin 59 y.o. 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      Diffuse large B cell lymphoma   07/30/2008 Initial Diagnosis Diffuse large B cell lymphoma on thoracic spine   08/26/2008 - 12/21/2008 Chemotherapy R-CHOP x 6 cycles with intrathecal methotrexate and SoluCortef for cycles 3-6.   08/29/2008 PET scan Single area of hypermetabolic activity is identified corresponding to the known soft tissue lesion at the level of T6.   08/30/2008 Bone Marrow Biopsy Normocellular marrow with trilineage hematopoiesis.   10/10/2008 PET scan Marked interval decrease in size and activity associated with T6 vertebral body metastasis.   10/17/2008 Procedure CSF exam shows scattered lympocytes (reactive)   12/12/2008 Remission PET- The destructive lesion at T6 has metabolic activity similar to surrounding tissues and mildly decreased compared to last exam.   08/24/2009 PET scan NED.  New pericardial and pleural effusions   01/11/2010 -  Radiation Therapy Involved field radiation    03/07/2010 PET scan Medium-sized focus of moderate increased uptake within T7 vertebra in region of previously noted tumor.  Resolution of pericardial effusion.  Increase in left pleural effusion.   03/09/2010 Imaging MRI T-spine- Acute milkd compression fxn of T7, suspicious for pathologic fracture/recurrence..  Acute mild compression fxn at T4, primarily superior endplate.  Cannot rule out tumor.   03/16/2010 Procedure T-7 vertebral biopsy shows fibrosis and new bone formation   07/16/2010 Imaging MRI T-spine- Decreased marrow edema and enhancement involving fractures at T4 and T7, compatible with healing fxn.    I personally reviewed and went over laboratory results with the patient.  The results are noted within this dictation.  He reports that he is overcoming bronchitis and sinus infection treated with a Z-Pak by primary care provider.  He notes that is improving.    He notes exertional dyspnea which is likely multifactorial and is chronic without any acute worsening.    He denies any B symptoms including fevers, chills, night sweats, and unintentional weight loss.  He is educated be on the look out for these.  Hematologically, he denies any complaints and ROS questioning is negative.  Past Medical History  Diagnosis Date  . Chest pain     left heart catherization  . History of cardiac catheterization   . Cardiomyopathy   . Subacute effusive constrictive pericarditis   . Hyperlipidemia     myalgias with pravastatin and simvastatin.  Marland Kitchen Anxiety   . ACEI/ARB contraindicated   . CHF (congestive heart failure)   . Obesity 08/16/2011  . NHL (non-Hodgkin's lymphoma)     history of  . HTN (hypertension)   . Bronchitis   . Sinus infection     has Diffuse large B cell lymphoma; Hyperlipidemia with target LDL less than 100; CONSTRICTIVE PERICARDITIS; PERICARDIAL EFFUSION; Congestive heart failure; GAD (generalized anxiety disorder); Insomnia; Port catheter in place; Hypertension; and Severe obesity (BMI >= 40) on his problem list.     is allergic to penicillins and simvastatin.  Mr. Vitelli does  not currently have medications on file.  Past Surgical History  Procedure Laterality Date  . Left heart cath  10/10  . Nasal sinus surgery    . Hemorrhoid surgery    . Tumor removal    . Back surgery  07/2008    tumor removed off spine  . Portacath placement    . Heart sack removed   07/2009    Denies any headaches, dizziness, double vision, fevers, chills, night sweats, nausea, vomiting, diarrhea, constipation, chest pain, heart palpitations, shortness of breath, blood in stool, black tarry stool, urinary pain, urinary burning, urinary frequency, hematuria.   PHYSICAL EXAMINATION  ECOG PERFORMANCE STATUS: 0 - Asymptomatic  Filed Vitals:   11/03/14 0931  BP: 125/66  Pulse: 66  Temp: 98.3 F (36.8 C)  Resp: 18    GENERAL:alert, no distress, well nourished, well developed, comfortable, cooperative, obese and smiling SKIN: skin color, texture, turgor are normal, no rashes or significant lesions HEAD: Normocephalic, No masses, lesions, tenderness or abnormalities EYES: normal, PERRLA, EOMI, Conjunctiva are pink and non-injected EARS: External ears normal OROPHARYNX:lips, buccal mucosa, and tongue normal and mucous membranes are moist  NECK: supple, no adenopathy, thyroid normal size, non-tender, without nodularity, no stridor, non-tender, trachea midline LYMPH:  no palpable lymphadenopathy, no hepatosplenomegaly BREAST:not examined LUNGS: clear to auscultation and percussion HEART: regular rate & rhythm, no murmurs, no gallops, S1 normal and S2 normal ABDOMEN:abdomen soft, non-tender, obese, normal bowel sounds and no masses or organomegaly BACK: Back symmetric, no curvature., No CVA tenderness EXTREMITIES:less then 2 second capillary refill, no joint deformities, effusion, or inflammation, no edema, no skin discoloration, no clubbing, no cyanosis  NEURO: alert & oriented x 3 with fluent speech, no focal motor/sensory deficits, gait normal   LABORATORY DATA: CBC    Component Value Date/Time   WBC 5.5 10/26/2014 1600   RBC 4.63 10/26/2014 1600   RBC 4.63 10/26/2014 1600   HGB 13.8 10/26/2014 1600   HCT 42.3 10/26/2014 1600   PLT 161 10/26/2014 1600   MCV 91.4 10/26/2014 1600   MCH 29.8 10/26/2014 1600   MCHC 32.6 10/26/2014 1600   RDW 12.8 10/26/2014  1600   LYMPHSABS 1.3 10/26/2014 1600   MONOABS 0.8 10/26/2014 1600   EOSABS 0.3 10/26/2014 1600   BASOSABS 0.0 10/26/2014 1600      Chemistry      Component Value Date/Time   NA 139 10/26/2014 1600   NA 141 10/19/2014 1021   K 3.8 10/26/2014 1600   CL 102 10/26/2014 1600   CO2 31 10/26/2014 1600   BUN 9 10/26/2014 1600   BUN 11 10/19/2014 1021   CREATININE 0.83 10/26/2014 1600   CREATININE 0.89 03/11/2013 1202      Component Value Date/Time   CALCIUM 9.0 10/26/2014 1600   ALKPHOS 87 10/26/2014 1600   AST 48* 10/26/2014 1600   ALT 57* 10/26/2014 1600   BILITOT 0.7 10/26/2014 1600     Results for ALICK, LECOMTE (MRN [id number]) as of 10/31/2014 19:00  Ref. Range 10/26/2014 16:00  LDH Latest Range: 94-250 U/L 167   Results for ROMIN, DIVITA (MRN [id number]) as of 10/31/2014 19:00  Ref. Range 10/26/2014 16:00  Beta-2 Microglobulin Latest Range: <=2.51 mg/L 2.42 (H)    RADIOGRAPHIC STUDIES:  Dg Chest 2 View  10/19/2014   CLINICAL DATA:  Hypertension.  EXAM: CHEST  2 VIEW  COMPARISON:  None.  FINDINGS: Left subclavian central line noted with tip in superior vena cava. Mediastinal structures normal. Bibasilar subsegmental  atelectasis. Mild left base infiltrate noted. Prior CABG. Heart size normal. Small left pleural effusion. No acute bony abnormality. Prior thoracic spine fusion.  IMPRESSION: Left lower lobe infiltrate with small left pleural effusion.   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  Labs reviewed and are solid from an oncology  standpoint.  Repeat labs in 12 months: CBC diff, CMET, LDH, ESR, B2M.  Return in 12 months for follow-up per NCCN guidelines recommendation for surveillance.     THERAPY PLAN:  NCCN guidelines recommends the follow surveillance for Stage I-IV NHL for those who attain a complete response to therapy:  A. H&P every 3-6 months for 5 years, then yearly or as clinically indicated.  B. Labs every 3-6 months for 5 years and then annually or as clinically indicated.  C. Repeat CT scans only as clinically indicated.   All questions were answered. The patient knows to call the clinic with any problems, questions or concerns. We can certainly see the patient much sooner if necessary.  Patient and plan discussed with Dr. Ancil Linsey and she is in agreement with the aforementioned.   Merle Whitehorn 11/03/2014

## 2014-11-03 ENCOUNTER — Encounter (HOSPITAL_BASED_OUTPATIENT_CLINIC_OR_DEPARTMENT_OTHER): Payer: 59 | Admitting: Oncology

## 2014-11-03 ENCOUNTER — Encounter (HOSPITAL_COMMUNITY): Payer: Self-pay | Admitting: Oncology

## 2014-11-03 VITALS — BP 125/66 | HR 66 | Temp 98.3°F | Resp 18 | Wt 296.5 lb

## 2014-11-03 DIAGNOSIS — C8330 Diffuse large B-cell lymphoma, unspecified site: Secondary | ICD-10-CM

## 2014-11-03 NOTE — Patient Instructions (Signed)
[place] at [facility]  Discharge Instructions:  Labs most recently are great and stable. Labs in 12 months for follow-up Continue to follow-up with primary care provider as directed.  Return in 12 months for follow-up, sooner if needed.   Please be on the look out for fevers, chills, night sweats (drenching) that do not resolve. Call with any enlarged lymph nodes as well.  _______________________________________________________________  Thank you for choosing [place] at [facility] to provide your oncology and hematology care.  To afford each patient quality time with our providers, please arrive at least 15 minutes before your scheduled appointment.  You need to re-schedule your appointment if you arrive 10 or more minutes late.  We strive to give you quality time with our providers, and arriving late affects you and other patients whose appointments are after yours.  Also, if you no show three or more times for appointments you may be dismissed from the clinic.  Again, thank you for choosing [place] at [place] hope is that these requests will allow you access to exceptional care and in a timely manner. _______________________________________________________________  If you have questions after your visit, please contact our office at [phone] between the hours of 8:30 a.m. and 5:00 p.m. Voicemails left after 4:30 p.m. will not be returned until the following business day. _______________________________________________________________  For prescription refill requests, have your pharmacy contact our office. _______________________________________________________________  Recommendations made by the consultant and any test results will be sent to your referring physician. _______________________________________________________________

## 2014-11-16 ENCOUNTER — Encounter (HOSPITAL_COMMUNITY): Payer: 59

## 2014-12-07 ENCOUNTER — Encounter (HOSPITAL_COMMUNITY): Payer: 59 | Attending: Hematology & Oncology

## 2014-12-07 DIAGNOSIS — Z452 Encounter for adjustment and management of vascular access device: Secondary | ICD-10-CM

## 2014-12-07 DIAGNOSIS — Z923 Personal history of irradiation: Secondary | ICD-10-CM | POA: Insufficient documentation

## 2014-12-07 DIAGNOSIS — Z08 Encounter for follow-up examination after completed treatment for malignant neoplasm: Secondary | ICD-10-CM | POA: Insufficient documentation

## 2014-12-07 DIAGNOSIS — C8330 Diffuse large B-cell lymphoma, unspecified site: Secondary | ICD-10-CM

## 2014-12-07 DIAGNOSIS — Z8579 Personal history of other malignant neoplasms of lymphoid, hematopoietic and related tissues: Secondary | ICD-10-CM | POA: Insufficient documentation

## 2014-12-07 DIAGNOSIS — Z9221 Personal history of antineoplastic chemotherapy: Secondary | ICD-10-CM | POA: Insufficient documentation

## 2014-12-07 MED ORDER — SODIUM CHLORIDE 0.9 % IJ SOLN
10.0000 mL | INTRAMUSCULAR | Status: DC | PRN
  Administered 2014-12-07: 10 mL via INTRAVENOUS
  Filled 2014-12-07: qty 10

## 2014-12-07 MED ORDER — HEPARIN SOD (PORK) LOCK FLUSH 100 UNIT/ML IV SOLN
500.0000 [IU] | Freq: Once | INTRAVENOUS | Status: AC
  Administered 2014-12-07: 500 [IU] via INTRAVENOUS

## 2014-12-07 NOTE — Progress Notes (Signed)
Matthew Irwin presented for Portacath access and flush. Proper placement of portacath confirmed by CXR. Portacath located lt  chest wall accessed with  H 20 needle. Good blood return present. Portacath flushed with 34ml NS and 500U/49ml Heparin and needle removed intact. Procedure without incident. Patient tolerated procedure well.

## 2014-12-28 ENCOUNTER — Encounter (HOSPITAL_COMMUNITY): Payer: BC Managed Care – PPO

## 2015-01-18 ENCOUNTER — Encounter (HOSPITAL_COMMUNITY): Payer: 59 | Attending: Hematology & Oncology

## 2015-01-18 ENCOUNTER — Encounter: Payer: Self-pay | Admitting: Nurse Practitioner

## 2015-01-18 ENCOUNTER — Ambulatory Visit (INDEPENDENT_AMBULATORY_CARE_PROVIDER_SITE_OTHER): Payer: 59 | Admitting: Nurse Practitioner

## 2015-01-18 VITALS — BP 119/75 | HR 69 | Temp 97.1°F | Ht 70.0 in | Wt 300.0 lb

## 2015-01-18 DIAGNOSIS — G4700 Insomnia, unspecified: Secondary | ICD-10-CM

## 2015-01-18 DIAGNOSIS — I509 Heart failure, unspecified: Secondary | ICD-10-CM

## 2015-01-18 DIAGNOSIS — E785 Hyperlipidemia, unspecified: Secondary | ICD-10-CM

## 2015-01-18 DIAGNOSIS — Z452 Encounter for adjustment and management of vascular access device: Secondary | ICD-10-CM

## 2015-01-18 DIAGNOSIS — Z8579 Personal history of other malignant neoplasms of lymphoid, hematopoietic and related tissues: Secondary | ICD-10-CM | POA: Insufficient documentation

## 2015-01-18 DIAGNOSIS — Z1211 Encounter for screening for malignant neoplasm of colon: Secondary | ICD-10-CM

## 2015-01-18 DIAGNOSIS — Z923 Personal history of irradiation: Secondary | ICD-10-CM | POA: Insufficient documentation

## 2015-01-18 DIAGNOSIS — I10 Essential (primary) hypertension: Secondary | ICD-10-CM

## 2015-01-18 DIAGNOSIS — F411 Generalized anxiety disorder: Secondary | ICD-10-CM | POA: Diagnosis not present

## 2015-01-18 DIAGNOSIS — C8330 Diffuse large B-cell lymphoma, unspecified site: Secondary | ICD-10-CM

## 2015-01-18 DIAGNOSIS — Z08 Encounter for follow-up examination after completed treatment for malignant neoplasm: Secondary | ICD-10-CM | POA: Insufficient documentation

## 2015-01-18 DIAGNOSIS — Z9221 Personal history of antineoplastic chemotherapy: Secondary | ICD-10-CM | POA: Insufficient documentation

## 2015-01-18 DIAGNOSIS — Z95828 Presence of other vascular implants and grafts: Secondary | ICD-10-CM

## 2015-01-18 MED ORDER — HEPARIN SOD (PORK) LOCK FLUSH 100 UNIT/ML IV SOLN
INTRAVENOUS | Status: AC
  Filled 2015-01-18: qty 5

## 2015-01-18 MED ORDER — SODIUM CHLORIDE 0.9 % IJ SOLN
10.0000 mL | Freq: Once | INTRAMUSCULAR | Status: AC
  Administered 2015-01-18: 10 mL via INTRAVENOUS

## 2015-01-18 MED ORDER — TRAZODONE HCL 100 MG PO TABS: ORAL_TABLET | ORAL | Status: DC

## 2015-01-18 MED ORDER — ESCITALOPRAM OXALATE 20 MG PO TABS: ORAL_TABLET | ORAL | Status: DC

## 2015-01-18 MED ORDER — HEPARIN SOD (PORK) LOCK FLUSH 100 UNIT/ML IV SOLN
500.0000 [IU] | Freq: Once | INTRAVENOUS | Status: AC
  Administered 2015-01-18: 500 [IU] via INTRAVENOUS

## 2015-01-18 MED ORDER — CLONAZEPAM 0.5 MG PO TABS: ORAL_TABLET | ORAL | Status: DC

## 2015-01-18 MED ORDER — CARVEDILOL 3.125 MG PO TABS: 3.1250 mg | ORAL_TABLET | Freq: Two times a day (BID) | ORAL | Status: DC

## 2015-01-18 MED ORDER — ATORVASTATIN CALCIUM 40 MG PO TABS: ORAL_TABLET | ORAL | Status: DC

## 2015-01-18 NOTE — Progress Notes (Signed)
Matthew Irwin presented for Portacath access and flush. Proper placement of portacath confirmed by CXR. Portacath located left chest wall accessed with  H 20 needle. Good blood return present. Portacath flushed with 22ml NS and 500U/78ml Heparin and needle removed intact. Procedure without incident. Patient tolerated procedure well.

## 2015-01-18 NOTE — Patient Instructions (Signed)
[facility] at [facility] Discharge Instructions  RECOMMENDATIONS MADE BY THE CONSULTANT AND ANY TEST RESULTS WILL BE SENT TO YOUR REFERRING PHYSICIAN.  Port flush today. Continue port flush every 6 weeks. Return as scheduled.  Thank you for choosing [facility] at [facility] to provide your oncology and hematology care.  To afford each patient quality time with our provider, please arrive at least 15 minutes before your scheduled appointment time.    You need to re-schedule your appointment should you arrive 10 or more minutes late.  We strive to give you quality time with our providers, and arriving late affects you and other patients whose appointments are after yours.  Also, if you no show three or more times for appointments you may be dismissed from the clinic at the providers discretion.     Again, thank you for choosing [facility].  Our hope is that these requests will decrease the amount of time that you wait before being seen by our physicians.       _____________________________________________________________  Should you have questions after your visit to [facility], please contact our office at [phone] between the hours of 8:30 a.m. and 4:30 p.m.  Voicemails left after 4:30 p.m. will not be returned until the following business day.  For prescription refill requests, have your pharmacy contact our office.    

## 2015-01-18 NOTE — Patient Instructions (Signed)
Exercise to Stay Healthy Exercise helps you become and stay healthy. EXERCISE IDEAS AND TIPS Choose exercises that:  You enjoy.  Fit into your day. You do not need to exercise really hard to be healthy. You can do exercises at a slow or medium level and stay healthy. You can:  Stretch before and after working out.  Try yoga, Pilates, or tai chi.  Lift weights.  Walk fast, swim, jog, run, climb stairs, bicycle, dance, or rollerskate.  Take aerobic classes. Exercises that burn about 150 calories:  Running 1  miles in 15 minutes.  Playing volleyball for 45 to 60 minutes.  Washing and waxing a car for 45 to 60 minutes.  Playing touch football for 45 minutes.  Walking 1  miles in 35 minutes.  Pushing a stroller 1  miles in 30 minutes.  Playing basketball for 30 minutes.  Raking leaves for 30 minutes.  Bicycling 5 miles in 30 minutes.  Walking 2 miles in 30 minutes.  Dancing for 30 minutes.  Shoveling snow for 15 minutes.  Swimming laps for 20 minutes.  Walking up stairs for 15 minutes.  Bicycling 4 miles in 15 minutes.  Gardening for 30 to 45 minutes.  Jumping rope for 15 minutes.  Washing windows or floors for 45 to 60 minutes. Document Released: 11/09/2010 Document Revised: 12/30/2011 Document Reviewed: 11/09/2010 ExitCare Patient Information 2015 [employer]. This information is not intended to replace advice given to you by your health care provider. Make sure you discuss any questions you have with your health care provider.  

## 2015-01-18 NOTE — Progress Notes (Signed)
Subjective:    Patient ID: Matthew Irwin, male    DOB: [birth year], 59 y.o.   MRN: [id number]   Patient here today for follow up of chronic medical problems. The patient he has a heel spur on his left foot that has begun to bother him in the mornings lately.    Hypertension This is a chronic problem. The current episode started more than 1 year ago. The problem is controlled. Associated symptoms include shortness of breath (Reports SOB when walking in incline--reports this has not changed since his heart surgery. ). Pertinent negatives include no chest pain, headaches, neck pain or palpitations. Risk factors for coronary artery disease include dyslipidemia, family history, male gender, obesity and stress. Past treatments include angiotensin blockers. The current treatment provides significant improvement. Compliance problems include diet and exercise.   Hyperlipidemia This is a chronic problem. The problem is uncontrolled. Recent lipid tests were reviewed and are high. Exacerbating diseases include obesity. He has no history of diabetes or hypothyroidism. Associated symptoms include shortness of breath (Reports SOB when walking in incline--reports this has not changed since his heart surgery. ). Pertinent negatives include no chest pain or myalgias. Current antihyperlipidemic treatment includes statins. The current treatment provides moderate improvement of lipids. Compliance problems include adherence to diet and adherence to exercise.  Risk factors for coronary artery disease include dyslipidemia, hypertension, male sex and obesity.  Depression Lexapro and clonazepam- Working well- No c/o side effects insomnia trazadone helps him sleep at night. Feels rested in AM  Review of Systems  Constitutional: Negative for activity change, appetite change, fatigue and unexpected weight change.  HENT: Negative.   Eyes: Negative.  Negative for visual disturbance.  Respiratory: Positive for shortness of breath  (Reports SOB when walking in incline--reports this has not changed since his heart surgery. ). Negative for cough, chest tightness and wheezing.   Cardiovascular: Negative for chest pain, palpitations and leg swelling.  Musculoskeletal: Negative for myalgias and neck pain.  Neurological: Negative for headaches.  All other systems reviewed and are negative.      Objective:   Physical Exam  Constitutional: He is oriented to person, place, and time. He appears well-developed and well-nourished.  HENT:  Head: Normocephalic.  Right Ear: External ear normal.  Left Ear: External ear normal.  Nose: Nose normal.  Mouth/Throat: Oropharynx is clear and moist.  Cerumen obstructing half of canal in left ear.   Eyes: Conjunctivae and EOM are normal. Pupils are equal, round, and reactive to light.  Neck: Normal range of motion. Neck supple. No JVD present. No thyromegaly present.  Cardiovascular: Normal rate, regular rhythm, normal heart sounds and intact distal pulses.  Exam reveals no gallop and no friction rub.   No murmur heard. Pulmonary/Chest: Effort normal and breath sounds normal. No respiratory distress. He has no wheezes. He has no rales. He exhibits no tenderness.  Abdominal: Soft. Bowel sounds are normal. He exhibits no mass. There is no tenderness.  Musculoskeletal: Normal range of motion. He exhibits no edema.  Lymphadenopathy:    He has no cervical adenopathy.  Neurological: He is alert and oriented to person, place, and time. No cranial nerve deficit.  Skin: Skin is warm and dry.  Dry flaky  Psychiatric: He has a normal mood and affect. His behavior is normal. Judgment and thought content normal.   BP 119/75 mmHg  Pulse 69  Temp(Src) 97.1 F (36.2 C) (Oral)  Ht 5' 10" (1.778 m)  Wt 300 lb (136.079 kg)  BMI  43.05 kg/m2      Assessment & Plan:  1. Congestive heart failure, unspecified congestive heart failure chronicity, unspecified congestive heart failure type  2.  Essential hypertension Do not add slat to diet - CMP14+EGFR - carvedilol (COREG) 3.125 MG tablet; Take 1 tablet (3.125 mg total) by mouth 2 (two) times daily with a meal.  Dispense: 60 tablet; Refill: 5  3. GAD (generalized anxiety disorder) Stress managenement - clonazePAM (KLONOPIN) 0.5 MG tablet; TAKE  (1)  TABLET TWICE A DAY.  Dispense: 60 tablet; Refill: 2 - escitalopram (LEXAPRO) 20 MG tablet; TAKE 1 TABLET DAILY  Dispense: 30 tablet; Refill: 5  4. Hyperlipidemia with target LDL less than 100 Low fat diet - NMR, lipoprofile - atorvastatin (LIPITOR) 40 MG tablet; Take 1 tablet (40 mg total) by mouth daily at 6 PM.  Dispense: 30 tablet; Refill: 5  5. Encounter for screening colonoscopy - Ambulatory referral to Gastroenterology  6. Insomnia Bedtime ritual - traZODone (DESYREL) 100 MG tablet; TAKE ONE TABLET AT BEDTIME  Dispense: 30 tablet; Refill: 5    Labs pending Health maintenance reviewed Diet and exercise encouraged Continue all meds Follow up  In 3 months   [place], FNP

## 2015-01-19 LAB — CMP14+EGFR
ALBUMIN: 4.3 g/dL (ref 3.5–5.5)
ALT: 55 IU/L — ABNORMAL HIGH (ref 0–44)
AST: 47 IU/L — ABNORMAL HIGH (ref 0–40)
Albumin/Globulin Ratio: 1.3 (ref 1.1–2.5)
Alkaline Phosphatase: 83 IU/L (ref 39–117)
BUN/Creatinine Ratio: 14 (ref 9–20)
BUN: 13 mg/dL (ref 6–24)
Bilirubin Total: 0.6 mg/dL (ref 0.0–1.2)
CALCIUM: 9.3 mg/dL (ref 8.7–10.2)
CO2: 27 mmol/L (ref 18–29)
CREATININE: 0.93 mg/dL (ref 0.76–1.27)
Chloride: 100 mmol/L (ref 97–108)
GFR calc Af Amer: 104 mL/min/{1.73_m2} (ref >59)
GFR calc non Af Amer: 90 mL/min/{1.73_m2} (ref >59)
Globulin, Total: 3.2 g/dL (ref 1.5–4.5)
Glucose: 92 mg/dL (ref 65–99)
Potassium: 4.4 mmol/L (ref 3.5–5.2)
Sodium: 141 mmol/L (ref 134–144)
Total Protein: 7.5 g/dL (ref 6.0–8.5)

## 2015-01-19 LAB — NMR, LIPOPROFILE
Cholesterol: 133 mg/dL (ref 100–199)
HDL CHOLESTEROL BY NMR: 34 mg/dL — AB (ref >39)
HDL PARTICLE NUMBER: 29.4 umol/L — AB (ref >=30.5)
LDL Particle Number: 902 nmol/L (ref <1000)
LDL Size: 20.1 nm (ref >20.5)
LDL-C: 60 mg/dL (ref 0–99)
LP-IR Score: 85 — ABNORMAL HIGH (ref <=45)
Small LDL Particle Number: 503 nmol/L (ref <=527)
Triglycerides by NMR: 196 mg/dL — ABNORMAL HIGH (ref 0–149)

## 2015-01-26 ENCOUNTER — Other Ambulatory Visit: Payer: Self-pay | Admitting: Nurse Practitioner

## 2015-01-26 ENCOUNTER — Encounter: Payer: Self-pay | Admitting: Nurse Practitioner

## 2015-01-26 MED ORDER — DICLOFENAC SODIUM 75 MG PO TBEC: 75.0000 mg | DELAYED_RELEASE_TABLET | Freq: Two times a day (BID) | ORAL | Status: DC

## 2015-03-01 ENCOUNTER — Encounter (HOSPITAL_COMMUNITY): Payer: 59

## 2015-03-02 ENCOUNTER — Encounter (HOSPITAL_COMMUNITY): Payer: 59 | Attending: Hematology & Oncology

## 2015-03-02 DIAGNOSIS — Z8579 Personal history of other malignant neoplasms of lymphoid, hematopoietic and related tissues: Secondary | ICD-10-CM | POA: Insufficient documentation

## 2015-03-02 DIAGNOSIS — Z923 Personal history of irradiation: Secondary | ICD-10-CM | POA: Insufficient documentation

## 2015-03-02 DIAGNOSIS — Z9221 Personal history of antineoplastic chemotherapy: Secondary | ICD-10-CM | POA: Insufficient documentation

## 2015-03-02 DIAGNOSIS — Z08 Encounter for follow-up examination after completed treatment for malignant neoplasm: Secondary | ICD-10-CM | POA: Insufficient documentation

## 2015-03-02 NOTE — Patient Instructions (Signed)
[facility] at [facility] Discharge Instructions  RECOMMENDATIONS MADE BY THE CONSULTANT AND ANY TEST RESULTS WILL BE SENT TO YOUR REFERRING PHYSICIAN.  Port flush today. Return as scheduled for port flushes and office visit.  Thank you for choosing [facility] at [facility] to provide your oncology and hematology care.  To afford each patient quality time with our provider, please arrive at least 15 minutes before your scheduled appointment time.    You need to re-schedule your appointment should you arrive 10 or more minutes late.  We strive to give you quality time with our providers, and arriving late affects you and other patients whose appointments are after yours.  Also, if you no show three or more times for appointments you may be dismissed from the clinic at the providers discretion.     Again, thank you for choosing [facility].  Our hope is that these requests will decrease the amount of time that you wait before being seen by our physicians.       _____________________________________________________________  Should you have questions after your visit to [facility], please contact our office at [phone] between the hours of 8:30 a.m. and 4:30 p.m.  Voicemails left after 4:30 p.m. will not be returned until the following business day.  For prescription refill requests, have your pharmacy contact our office.    

## 2015-04-12 ENCOUNTER — Encounter (HOSPITAL_COMMUNITY): Payer: Self-pay

## 2015-04-12 ENCOUNTER — Encounter (HOSPITAL_COMMUNITY): Payer: 59 | Attending: Hematology & Oncology

## 2015-04-12 DIAGNOSIS — C8330 Diffuse large B-cell lymphoma, unspecified site: Secondary | ICD-10-CM | POA: Diagnosis not present

## 2015-04-12 DIAGNOSIS — Z452 Encounter for adjustment and management of vascular access device: Secondary | ICD-10-CM

## 2015-04-12 DIAGNOSIS — Z923 Personal history of irradiation: Secondary | ICD-10-CM | POA: Insufficient documentation

## 2015-04-12 DIAGNOSIS — Z08 Encounter for follow-up examination after completed treatment for malignant neoplasm: Secondary | ICD-10-CM | POA: Insufficient documentation

## 2015-04-12 DIAGNOSIS — Z8579 Personal history of other malignant neoplasms of lymphoid, hematopoietic and related tissues: Secondary | ICD-10-CM | POA: Insufficient documentation

## 2015-04-12 DIAGNOSIS — Z9221 Personal history of antineoplastic chemotherapy: Secondary | ICD-10-CM | POA: Insufficient documentation

## 2015-04-12 MED ORDER — SODIUM CHLORIDE 0.9 % IJ SOLN
10.0000 mL | INTRAMUSCULAR | Status: DC | PRN
  Administered 2015-04-12: 10 mL via INTRAVENOUS
  Filled 2015-04-12: qty 10

## 2015-04-12 MED ORDER — HEPARIN SOD (PORK) LOCK FLUSH 100 UNIT/ML IV SOLN
500.0000 [IU] | Freq: Once | INTRAVENOUS | Status: AC
Start: 2015-04-12 — End: 2015-04-12
  Administered 2015-04-12: 500 [IU] via INTRAVENOUS

## 2015-04-12 NOTE — Patient Instructions (Signed)
[place] at [facility] Discharge Instructions  RECOMMENDATIONS MADE BY THE CONSULTANT AND ANY TEST RESULTS WILL BE SENT TO YOUR REFERRING PHYSICIAN.  Port flush today. Return as scheduled for port flushes.  Thank you for choosing [place] at [facility] to provide your oncology and hematology care.  To afford each patient quality time with our provider, please arrive at least 15 minutes before your scheduled appointment time.    You need to re-schedule your appointment should you arrive 10 or more minutes late.  We strive to give you quality time with our providers, and arriving late affects you and other patients whose appointments are after yours.  Also, if you no show three or more times for appointments you may be dismissed from the clinic at the providers discretion.     Again, thank you for choosing [facility].  Our hope is that these requests will decrease the amount of time that you wait before being seen by our physicians.       _____________________________________________________________  Should you have questions after your visit to [facility], please contact our office at [phone] between the hours of 8:30 a.m. and 4:30 p.m.  Voicemails left after 4:30 p.m. will not be returned until the following business day.  For prescription refill requests, have your pharmacy contact our office.

## 2015-04-12 NOTE — Progress Notes (Signed)
Matthew Irwin presented for Portacath access and flush.  Proper placement of portacath confirmed by CXR.  Portacath located left chest wall accessed with  H 20 needle.  Good blood return present. Portacath flushed with 50ml NS and 500U/3ml Heparin and needle removed intact.  Procedure tolerated well and without incident.

## 2015-05-15 ENCOUNTER — Other Ambulatory Visit: Payer: Self-pay | Admitting: Nurse Practitioner

## 2015-05-17 ENCOUNTER — Encounter (HOSPITAL_COMMUNITY): Payer: 59

## 2015-06-07 ENCOUNTER — Encounter (HOSPITAL_COMMUNITY): Payer: Self-pay

## 2015-06-07 ENCOUNTER — Encounter (HOSPITAL_COMMUNITY): Payer: 59 | Attending: Hematology & Oncology

## 2015-06-07 DIAGNOSIS — C8330 Diffuse large B-cell lymphoma, unspecified site: Secondary | ICD-10-CM | POA: Diagnosis not present

## 2015-06-07 DIAGNOSIS — Z8579 Personal history of other malignant neoplasms of lymphoid, hematopoietic and related tissues: Secondary | ICD-10-CM | POA: Diagnosis not present

## 2015-06-07 DIAGNOSIS — Z9221 Personal history of antineoplastic chemotherapy: Secondary | ICD-10-CM | POA: Insufficient documentation

## 2015-06-07 DIAGNOSIS — Z923 Personal history of irradiation: Secondary | ICD-10-CM | POA: Diagnosis not present

## 2015-06-07 DIAGNOSIS — Z452 Encounter for adjustment and management of vascular access device: Secondary | ICD-10-CM

## 2015-06-07 DIAGNOSIS — Z08 Encounter for follow-up examination after completed treatment for malignant neoplasm: Secondary | ICD-10-CM | POA: Diagnosis present

## 2015-06-07 LAB — COMPREHENSIVE METABOLIC PANEL
ALT: 55 U/L (ref 17–63)
ANION GAP: 8 (ref 5–15)
AST: 51 U/L — ABNORMAL HIGH (ref 15–41)
Albumin: 4.1 g/dL (ref 3.5–5.0)
Alkaline Phosphatase: 66 U/L (ref 38–126)
BUN: 22 mg/dL — ABNORMAL HIGH (ref 6–20)
CO2: 29 mmol/L (ref 22–32)
Calcium: 9.2 mg/dL (ref 8.9–10.3)
Chloride: 103 mmol/L (ref 101–111)
Creatinine, Ser: 1.07 mg/dL (ref 0.61–1.24)
GFR calc non Af Amer: >60 mL/min (ref >60)
Glucose, Bld: 88 mg/dL (ref 65–99)
Potassium: 3.8 mmol/L (ref 3.5–5.1)
SODIUM: 140 mmol/L (ref 135–145)
Total Bilirubin: 0.9 mg/dL (ref 0.3–1.2)
Total Protein: 7.6 g/dL (ref 6.5–8.1)

## 2015-06-07 LAB — LACTATE DEHYDROGENASE: LDH: 168 U/L (ref 98–192)

## 2015-06-07 LAB — CBC WITH DIFFERENTIAL/PLATELET
Basophils Absolute: 0 10*3/uL (ref 0.0–0.1)
Basophils Relative: 0 % (ref 0–1)
EOS PCT: 5 % (ref 0–5)
Eosinophils Absolute: 0.3 10*3/uL (ref 0.0–0.7)
HCT: 40.5 % (ref 39.0–52.0)
Hemoglobin: 13.5 g/dL (ref 13.0–17.0)
LYMPHS ABS: 1.5 10*3/uL (ref 0.7–4.0)
Lymphocytes Relative: 26 % (ref 12–46)
MCH: 31.3 pg (ref 26.0–34.0)
MCHC: 33.3 g/dL (ref 30.0–36.0)
MCV: 93.8 fL (ref 78.0–100.0)
MONO ABS: 0.6 10*3/uL (ref 0.1–1.0)
MONOS PCT: 10 % (ref 3–12)
Neutro Abs: 3.2 10*3/uL (ref 1.7–7.7)
Neutrophils Relative %: 59 % (ref 43–77)
PLATELETS: 162 10*3/uL (ref 150–400)
RBC: 4.32 MIL/uL (ref 4.22–5.81)
RDW: 12.7 % (ref 11.5–15.5)
WBC: 5.5 10*3/uL (ref 4.0–10.5)

## 2015-06-07 LAB — SEDIMENTATION RATE: SED RATE: 15 mm/h (ref 0–16)

## 2015-06-07 LAB — C-REACTIVE PROTEIN: CRP: 0.6 mg/dL (ref <1.0)

## 2015-06-07 MED ORDER — HEPARIN SOD (PORK) LOCK FLUSH 100 UNIT/ML IV SOLN
500.0000 [IU] | Freq: Once | INTRAVENOUS | Status: AC
  Administered 2015-06-07: 500 [IU] via INTRAVENOUS

## 2015-06-07 MED ORDER — SODIUM CHLORIDE 0.9 % IJ SOLN
10.0000 mL | INTRAMUSCULAR | Status: DC | PRN
  Administered 2015-06-07: 10 mL via INTRAVENOUS
  Filled 2015-06-07: qty 10

## 2015-06-07 NOTE — Patient Instructions (Addendum)
[place] at [facility] Discharge Instructions  RECOMMENDATIONS MADE BY THE CONSULTANT AND ANY TEST RESULTS WILL BE SENT TO YOUR REFERRING PHYSICIAN.  Port flush today. Lab work drawn Return as scheduled for port flushes. Return as scheduled for lab work and office visit.  Thank you for choosing [place] at [facility] to provide your oncology and hematology care.  To afford each patient quality time with our provider, please arrive at least 15 minutes before your scheduled appointment time.    You need to re-schedule your appointment should you arrive 10 or more minutes late.  We strive to give you quality time with our providers, and arriving late affects you and other patients whose appointments are after yours.  Also, if you no show three or more times for appointments you may be dismissed from the clinic at the providers discretion.     Again, thank you for choosing [facility].  Our hope is that these requests will decrease the amount of time that you wait before being seen by our physicians.       _____________________________________________________________  Should you have questions after your visit to [facility], please contact our office at [phone] between the hours of 8:30 a.m. and 4:30 p.m.  Voicemails left after 4:30 p.m. will not be returned until the following business day.  For prescription refill requests, have your pharmacy contact our office.

## 2015-06-07 NOTE — Progress Notes (Signed)
Pt c/o of lower back pain, wanted lab work to ease his mind because that's how his cancer started before.  Consulted with Ofilia Neas RN no doctor present in the clinic.

## 2015-06-07 NOTE — Progress Notes (Signed)
[name] presented for Portacath access and flush.   Portacath located left chest wall accessed with  H 20 needle.  Good blood return present. Portacath flushed with 51ml NS and 500U/29ml Heparin and needle removed intact.  Procedure tolerated well and without incident.

## 2015-06-08 LAB — BETA 2 MICROGLOBULIN, SERUM: Beta-2 Microglobulin: 1.9 mg/L (ref 0.6–2.4)

## 2015-06-28 ENCOUNTER — Encounter (HOSPITAL_COMMUNITY): Payer: 59

## 2015-07-18 ENCOUNTER — Other Ambulatory Visit: Payer: Self-pay | Admitting: Nurse Practitioner

## 2015-07-19 NOTE — Telephone Encounter (Signed)
Refills sent in for 30 days, needs to be seen before more refills given.

## 2015-07-19 NOTE — Telephone Encounter (Signed)
Last seen 01/18/15 MMM 

## 2015-08-02 ENCOUNTER — Encounter (HOSPITAL_COMMUNITY): Payer: Self-pay

## 2015-08-02 ENCOUNTER — Encounter (HOSPITAL_COMMUNITY): Payer: 59 | Attending: Hematology & Oncology

## 2015-08-02 VITALS — BP 137/88 | HR 71 | Temp 98.1°F | Resp 18

## 2015-08-02 DIAGNOSIS — Z923 Personal history of irradiation: Secondary | ICD-10-CM | POA: Insufficient documentation

## 2015-08-02 DIAGNOSIS — Z23 Encounter for immunization: Secondary | ICD-10-CM

## 2015-08-02 DIAGNOSIS — I878 Other specified disorders of veins: Secondary | ICD-10-CM

## 2015-08-02 DIAGNOSIS — Z08 Encounter for follow-up examination after completed treatment for malignant neoplasm: Secondary | ICD-10-CM | POA: Insufficient documentation

## 2015-08-02 DIAGNOSIS — Z0000 Encounter for general adult medical examination without abnormal findings: Secondary | ICD-10-CM

## 2015-08-02 DIAGNOSIS — Z8579 Personal history of other malignant neoplasms of lymphoid, hematopoietic and related tissues: Secondary | ICD-10-CM | POA: Insufficient documentation

## 2015-08-02 DIAGNOSIS — Z9221 Personal history of antineoplastic chemotherapy: Secondary | ICD-10-CM | POA: Insufficient documentation

## 2015-08-02 MED ORDER — INFLUENZA VAC SPLIT QUAD 0.5 ML IM SUSY
PREFILLED_SYRINGE | INTRAMUSCULAR | Status: AC
  Filled 2015-08-02: qty 0.5

## 2015-08-02 MED ORDER — INFLUENZA VAC SPLIT QUAD 0.5 ML IM SUSY
0.5000 mL | PREFILLED_SYRINGE | Freq: Once | INTRAMUSCULAR | Status: AC
  Administered 2015-08-02: 0.5 mL via INTRAMUSCULAR

## 2015-08-02 MED ORDER — HEPARIN SOD (PORK) LOCK FLUSH 100 UNIT/ML IV SOLN
500.0000 [IU] | Freq: Once | INTRAVENOUS | Status: AC
  Administered 2015-08-02: 500 [IU] via INTRAVENOUS
  Filled 2015-08-02: qty 5

## 2015-08-02 MED ORDER — SODIUM CHLORIDE 0.9 % IJ SOLN
10.0000 mL | INTRAMUSCULAR | Status: DC | PRN
  Administered 2015-08-02: 10 mL via INTRAVENOUS
  Filled 2015-08-02: qty 10

## 2015-08-02 NOTE — Progress Notes (Signed)
Matthew Irwin presents today for injection per MD orders. Flu vaccine administered IM in left deltoid. Administration without incident. Patient tolerated well.  Matthew Irwin presented for Portacath access and flush. Proper placement of portacath confirmed by CXR. Portacath located left chest wall accessed with  H 20 needle. Good blood return present. Portacath flushed with 58ml NS and 500U/63ml Heparin and needle removed intact. Procedure without incident. Patient tolerated procedure well.

## 2015-08-03 ENCOUNTER — Other Ambulatory Visit: Payer: Self-pay | Admitting: Nurse Practitioner

## 2015-08-04 NOTE — Telephone Encounter (Signed)
Last seen 12/2014

## 2015-08-04 NOTE — Telephone Encounter (Signed)
no more refills without being seen  

## 2015-08-09 ENCOUNTER — Encounter (HOSPITAL_COMMUNITY): Payer: 59

## 2015-08-14 ENCOUNTER — Ambulatory Visit: Payer: 59 | Admitting: Nurse Practitioner

## 2015-08-16 ENCOUNTER — Other Ambulatory Visit: Payer: Self-pay | Admitting: Pediatrics

## 2015-08-16 ENCOUNTER — Other Ambulatory Visit: Payer: Self-pay | Admitting: Nurse Practitioner

## 2015-08-16 NOTE — Telephone Encounter (Signed)
Last filled 06/16/15, has apt 09/15/15, call in at [facility]

## 2015-08-17 NOTE — Telephone Encounter (Signed)
Called into [facility].  

## 2015-08-17 NOTE — Telephone Encounter (Signed)
Patient NTBS for follow up and lab work  

## 2015-08-23 ENCOUNTER — Other Ambulatory Visit: Payer: Self-pay | Admitting: Pediatrics

## 2015-08-23 NOTE — Telephone Encounter (Signed)
Last filled 07/20/15, last seen 01/18/15

## 2015-08-24 NOTE — Telephone Encounter (Signed)
Patient NTBS for follow up and lab work  

## 2015-09-15 ENCOUNTER — Ambulatory Visit (INDEPENDENT_AMBULATORY_CARE_PROVIDER_SITE_OTHER): Payer: 59 | Admitting: Nurse Practitioner

## 2015-09-15 ENCOUNTER — Encounter: Payer: Self-pay | Admitting: Nurse Practitioner

## 2015-09-15 VITALS — BP 105/70 | HR 96 | Temp 97.5°F | Ht 70.0 in | Wt 305.0 lb

## 2015-09-15 DIAGNOSIS — F411 Generalized anxiety disorder: Secondary | ICD-10-CM | POA: Diagnosis not present

## 2015-09-15 DIAGNOSIS — I10 Essential (primary) hypertension: Secondary | ICD-10-CM

## 2015-09-15 DIAGNOSIS — C8330 Diffuse large B-cell lymphoma, unspecified site: Secondary | ICD-10-CM | POA: Diagnosis not present

## 2015-09-15 DIAGNOSIS — Z91048 Other nonmedicinal substance allergy status: Secondary | ICD-10-CM

## 2015-09-15 DIAGNOSIS — I509 Heart failure, unspecified: Secondary | ICD-10-CM

## 2015-09-15 DIAGNOSIS — Z1159 Encounter for screening for other viral diseases: Secondary | ICD-10-CM

## 2015-09-15 DIAGNOSIS — E785 Hyperlipidemia, unspecified: Secondary | ICD-10-CM | POA: Diagnosis not present

## 2015-09-15 DIAGNOSIS — Z1212 Encounter for screening for malignant neoplasm of rectum: Secondary | ICD-10-CM

## 2015-09-15 DIAGNOSIS — G4700 Insomnia, unspecified: Secondary | ICD-10-CM

## 2015-09-15 DIAGNOSIS — Z9109 Other allergy status, other than to drugs and biological substances: Secondary | ICD-10-CM

## 2015-09-15 MED ORDER — ATORVASTATIN CALCIUM 40 MG PO TABS: ORAL_TABLET | ORAL | Status: DC

## 2015-09-15 MED ORDER — ESCITALOPRAM OXALATE 20 MG PO TABS: 20.0000 mg | ORAL_TABLET | Freq: Every day | ORAL | Status: DC

## 2015-09-15 MED ORDER — LOSARTAN POTASSIUM 25 MG PO TABS: 25.0000 mg | ORAL_TABLET | Freq: Every day | ORAL | Status: DC

## 2015-09-15 MED ORDER — CLONAZEPAM 0.5 MG PO TABS: ORAL_TABLET | ORAL | Status: DC

## 2015-09-15 MED ORDER — CETIRIZINE HCL 10 MG PO TABS: ORAL_TABLET | ORAL | Status: DC

## 2015-09-15 MED ORDER — TRAZODONE HCL 100 MG PO TABS: 100.0000 mg | ORAL_TABLET | Freq: Every day | ORAL | Status: DC

## 2015-09-15 MED ORDER — CARVEDILOL 3.125 MG PO TABS: ORAL_TABLET | ORAL | Status: DC

## 2015-09-15 NOTE — Progress Notes (Signed)
Subjective:    Patient ID: Matthew Irwin, male    DOB: 05/26/1956, 59 y.o.   MRN: [id number]   Patient here today for follow up of chronic medical problems.   Hypertension This is a chronic problem. The current episode started more than 1 year ago. The problem is controlled. Associated symptoms include shortness of breath (Reports SOB when walking in incline--reports this has not changed since his heart surgery. ). Pertinent negatives include no chest pain, headaches, neck pain or palpitations. Risk factors for coronary artery disease include dyslipidemia, family history, male gender, obesity and stress. Past treatments include angiotensin blockers. The current treatment provides significant improvement. Compliance problems include diet and exercise.   Hyperlipidemia This is a chronic problem. The problem is uncontrolled. Recent lipid tests were reviewed and are high. Exacerbating diseases include obesity. He has no history of diabetes or hypothyroidism. Associated symptoms include shortness of breath (Reports SOB when walking in incline--reports this has not changed since his heart surgery. ). Pertinent negatives include no chest pain or myalgias. Current antihyperlipidemic treatment includes statins. The current treatment provides moderate improvement of lipids. Compliance problems include adherence to diet and adherence to exercise.  Risk factors for coronary artery disease include dyslipidemia, hypertension, male sex and obesity.  Depression Lexapro and clonazepam- Working well- No c/o side effects insomnia trazadone helps him sleep at night. Feels rested in AM Lymphoma Hx of  In spinal region- sees oncologist frequently- still in remission- no c/o back pain    Review of Systems  Constitutional: Negative for activity change, appetite change, fatigue and unexpected weight change.  HENT: Negative.   Eyes: Negative.  Negative for visual disturbance.  Respiratory: Positive for shortness of  breath (Reports SOB when walking in incline--reports this has not changed since his heart surgery. ). Negative for cough, chest tightness and wheezing.   Cardiovascular: Negative for chest pain, palpitations and leg swelling.  Musculoskeletal: Negative for myalgias and neck pain.  Neurological: Negative for headaches.  All other systems reviewed and are negative.      Objective:   Physical Exam  Constitutional: He is oriented to person, place, and time. He appears well-developed and well-nourished.  HENT:  Head: Normocephalic.  Right Ear: External ear normal.  Left Ear: External ear normal.  Nose: Nose normal.  Mouth/Throat: Oropharynx is clear and moist.  Cerumen obstructing half of canal in left ear.   Eyes: Conjunctivae and EOM are normal. Pupils are equal, round, and reactive to light.  Neck: Normal range of motion. Neck supple. No JVD present. No thyromegaly present.  Cardiovascular: Normal rate, regular rhythm, normal heart sounds and intact distal pulses.  Exam reveals no gallop and no friction rub.   No murmur heard. Pulmonary/Chest: Effort normal and breath sounds normal. No respiratory distress. He has no wheezes. He has no rales. He exhibits no tenderness.  Abdominal: Soft. Bowel sounds are normal. He exhibits no mass. There is no tenderness.  Musculoskeletal: Normal range of motion. He exhibits no edema.  Lymphadenopathy:    He has no cervical adenopathy.  Neurological: He is alert and oriented to person, place, and time. No cranial nerve deficit.  Skin: Skin is warm and dry.  Dry flaky  Psychiatric: He has a normal mood and affect. His behavior is normal. Judgment and thought content normal.   BP 105/70 mmHg  Pulse 96  Temp(Src) 97.5 F (36.4 C) (Oral)  Ht _0  (1.778 m)  Wt 305 lb (138.347 kg)  BMI 43.76 kg/m2  Assessment & Plan:  1. Environmental allergies - cetirizine (ZYRTEC) 10 MG tablet; TAKE 1 TABLET DAILY  Dispense: 30 tablet; Refill: 5  2.  Screening for malignant neoplasm of the rectum - Fecal occult blood, imunochemical; Future  3. Need for hepatitis C screening test - Hepatitis C antibody  4. Congestive heart failure, unspecified congestive heart failure chronicity, unspecified congestive heart failure type ([place]) keep  5. Essential hypertension Do not add salt to diet - CMP14+EGFR - losartan (COZAAR) 25 MG tablet; Take 1 tablet (25 mg total) by mouth daily.  Dispense: 30 tablet; Refill: 5 - carvedilol (COREG) 3.125 MG tablet; Take 1 tablet (3.125 mg total) by mouth 2 (two) times daily with a mea l.  Dispense: 60 tablet; Refill: 5  6. Hyperlipidemia with target LDL less than 100 Low fat diet - Lipid panel - atorvastatin (LIPITOR) 40 MG tablet; Take 1 tablet (40 mg total) by mouth daily at 6 PM.  Dispense: 30 tablet; Refill: 5  7. GAD (generalized anxiety disorder) Stress management - escitalopram (LEXAPRO) 20 MG tablet; Take 1 tablet (20 mg total) by mouth daily.  Dispense: 30 tablet; Refill: 5 - clonazePAM (KLONOPIN) 0.5 MG tablet; TAKE  (1)  TABLET TWICE A DAY.  Dispense: 60 tablet; Refill: 2  8. Insomnia Bedtime ritual - traZODone (DESYREL) 100 MG tablet; Take 1 tablet (100 mg total) by mouth at bedtime.  Dispense: 30 tablet; Refill: 5  9. Morbid obesity, unspecified obesity type ([place]) Discussed diet and exercise for person with BMI >25 Will recheck weight in 3-6 months   10. Diffuse large B-cell lymphoma, unspecified body region [facility])    Labs pending Health maintenance reviewed Diet and exercise encouraged Continue all meds Follow up  In 3 months   [place], FNP

## 2015-09-15 NOTE — Patient Instructions (Signed)

## 2015-09-17 LAB — CMP14+EGFR
ALT: 52 IU/L — AB (ref 0–44)
AST: 42 IU/L — ABNORMAL HIGH (ref 0–40)
Albumin/Globulin Ratio: 1.4 (ref 1.1–2.5)
Albumin: 4.3 g/dL (ref 3.5–5.5)
Alkaline Phosphatase: 88 IU/L (ref 39–117)
BILIRUBIN TOTAL: 0.6 mg/dL (ref 0.0–1.2)
BUN/Creatinine Ratio: 15 (ref 9–20)
BUN: 14 mg/dL (ref 6–24)
CHLORIDE: 99 mmol/L (ref 97–106)
CO2: 26 mmol/L (ref 18–29)
Calcium: 9.6 mg/dL (ref 8.7–10.2)
Creatinine, Ser: 0.92 mg/dL (ref 0.76–1.27)
GFR calc non Af Amer: 91 mL/min/{1.73_m2} (ref >59)
GFR, EST AFRICAN AMERICAN: 106 mL/min/{1.73_m2} (ref >59)
GLUCOSE: 89 mg/dL (ref 65–99)
Globulin, Total: 3.1 g/dL (ref 1.5–4.5)
Potassium: 3.9 mmol/L (ref 3.5–5.2)
Sodium: 141 mmol/L (ref 136–144)
TOTAL PROTEIN: 7.4 g/dL (ref 6.0–8.5)

## 2015-09-17 LAB — LIPID PANEL
CHOLESTEROL TOTAL: 145 mg/dL (ref 100–199)
Chol/HDL Ratio: 4 ratio units (ref 0.0–5.0)
HDL: 36 mg/dL — AB (ref >39)
LDL Calculated: 66 mg/dL (ref 0–99)
TRIGLYCERIDES: 217 mg/dL — AB (ref 0–149)
VLDL CHOLESTEROL CAL: 43 mg/dL — AB (ref 5–40)

## 2015-09-17 LAB — HEPATITIS C ANTIBODY

## 2015-09-20 ENCOUNTER — Encounter (HOSPITAL_COMMUNITY): Payer: 59

## 2015-09-25 ENCOUNTER — Other Ambulatory Visit: Payer: Self-pay | Admitting: Nurse Practitioner

## 2015-09-26 NOTE — Telephone Encounter (Signed)
rx called into pharmacy

## 2015-09-26 NOTE — Telephone Encounter (Signed)
Last seen 09/15/15  MMM If approved route to nurse to call into [facility]

## 2015-09-26 NOTE — Telephone Encounter (Signed)
Please call in klonopin with 1 refills 

## 2015-09-27 ENCOUNTER — Encounter (HOSPITAL_COMMUNITY): Payer: 59 | Attending: Hematology & Oncology

## 2015-09-27 DIAGNOSIS — Z9221 Personal history of antineoplastic chemotherapy: Secondary | ICD-10-CM | POA: Insufficient documentation

## 2015-09-27 DIAGNOSIS — Z8579 Personal history of other malignant neoplasms of lymphoid, hematopoietic and related tissues: Secondary | ICD-10-CM | POA: Insufficient documentation

## 2015-09-27 DIAGNOSIS — Z923 Personal history of irradiation: Secondary | ICD-10-CM | POA: Insufficient documentation

## 2015-09-27 DIAGNOSIS — Z08 Encounter for follow-up examination after completed treatment for malignant neoplasm: Secondary | ICD-10-CM | POA: Insufficient documentation

## 2015-10-02 ENCOUNTER — Encounter (HOSPITAL_COMMUNITY): Payer: Self-pay

## 2015-10-29 NOTE — Assessment & Plan Note (Addendum)
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    Labs today: CBC diff, CMET, LDH, ESR, CRP, B2M  Repeat labs in 12 months: CBC diff, CMET, LDH, ESR, B2M.    Chart reviewed.  No new changes.  He denies any complaints.  He denies any B symptoms.  He continues to work.  Return in 12 months for follow-up per NCCN guidelines recommendation for surveillance.  No role for surveillance imaging, but will perform if clinically indicated in the future.  He has two skin lesions that will need followed.  He has a left posterior helix ear skin lesion that has a healed scab in place that he reports is stable, but he picks at it and it bleeds.  He is advised to leave it be and let it heal.  If it persists, he will need to be referred to dermatology.  Additional right postauricular subcutaneous nodule with white surface is raised, nontender.  He reports it is very stable without any changes over years.  If changes are noted, he knows to call us or his primary care provider for further evaluation and/or referral to dermatology for removal. 

## 2015-10-29 NOTE — Progress Notes (Signed)
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      Diffuse large B cell lymphoma ([place])   07/30/2008 Initial Diagnosis Diffuse large B cell lymphoma on thoracic spine   08/26/2008 - 12/21/2008 Chemotherapy R-CHOP x 6 cycles with intrathecal methotrexate and SoluCortef for cycles 3-6.   08/29/2008 PET scan Single area of hypermetabolic activity is identified corresponding to the known soft tissue lesion at the level of T6.   08/30/2008 Bone Marrow Biopsy Normocellular marrow with trilineage hematopoiesis.   10/10/2008 PET scan Marked interval decrease in size and activity associated with T6 vertebral body metastasis.   10/17/2008 Procedure CSF exam shows scattered lympocytes (reactive)   12/12/2008 Remission PET- The destructive lesion at T6 has metabolic activity similar to surrounding tissues and mildly decreased compared to last exam.   08/24/2009 PET scan NED.  New pericardial and pleural effusions   01/11/2010 -  Radiation Therapy Involved field radiation    03/07/2010 PET scan Medium-sized focus of moderate increased  uptake within T7 vertebra in region of previously noted tumor.  Resolution of pericardial effusion.  Increase in left pleural effusion.   03/09/2010 Imaging MRI T-spine- Acute milkd compression fxn of T7, suspicious for pathologic fracture/recurrence..  Acute mild compression fxn at T4, primarily superior endplate.  Cannot rule out tumor.   03/16/2010 Procedure T-7 vertebral biopsy shows fibrosis and new bone formation   07/16/2010 Imaging MRI T-spine- Decreased marrow edema and enhancement involving fractures at T4 and T7, compatible with healing fxn.    I personally reviewed and went over laboratory results with the patient.  The results are noted within this dictation. They will be updated today.  He denies any B symptoms including fevers, chills, night sweats, and unintentional weight loss.  He is educated be on the look out for these.  Hematologically, he denies any complaints and ROS questioning is negative.  Past Medical History  Diagnosis Date  . Chest pain     left heart catherization  . History of cardiac catheterization   . Cardiomyopathy   . Subacute effusive constrictive pericarditis   . Hyperlipidemia     myalgias with pravastatin and simvastatin.  Marland Kitchen Anxiety   . ACEI/ARB contraindicated   . CHF (congestive heart failure) ([place])   . Obesity 08/16/2011  . NHL (non-Hodgkin's lymphoma) ([place])     history of  . HTN (hypertension)   . Bronchitis   . Sinus infection     has Diffuse large B cell lymphoma ([place]); Hyperlipidemia with target LDL less than 100; CONSTRICTIVE PERICARDITIS; PERICARDIAL EFFUSION; Congestive heart failure ([place]); GAD (generalized anxiety disorder); Insomnia; Port catheter in place; Hypertension; and Severe obesity (BMI >= 40) (HCC) on his problem list.     is allergic to penicillins  and simvastatin.  Matthew Irwin had no medications administered during this visit.  Past Surgical History  Procedure Laterality Date  . Left heart cath  10/10  . Nasal sinus  surgery    . Hemorrhoid surgery    . Tumor removal    . Back surgery  07/2008    tumor removed off spine  . Portacath placement    . Heart sack removed  07/2009    Denies any headaches, dizziness, double vision, fevers, chills, night sweats, nausea, vomiting, diarrhea, constipation, chest pain, heart palpitations, shortness of breath, blood in stool, black tarry stool, urinary pain, urinary burning, urinary frequency, hematuria.   PHYSICAL EXAMINATION  ECOG PERFORMANCE STATUS: 0 - Asymptomatic  Filed Vitals:   11/01/15 1021  BP: 104/70  Pulse: 71  Temp: 97.5 F (36.4 C)  Resp: 18    GENERAL:alert, no distress, well nourished, well developed, comfortable, cooperative, obese and smiling, accompanied by his wife. SKIN: skin color, texture, turgor are normal, no rashes or significant lesions HEAD: Normocephalic, No masses, lesions, tenderness or abnormalities EYES: normal, PERRLA, EOMI, Conjunctiva are pink and non-injected EARS: Left, posterior helix skin healed lesion.   OROPHARYNX:lips, buccal mucosa, and tongue normal and mucous membranes are moist  NECK: supple, no adenopathy, thyroid normal size, non-tender, without nodularity, no stridor, non-tender, trachea midline.  Right postauricular raised subcutaneous nodule with white surface. LYMPH:  no palpable lymphadenopathy, no hepatosplenomegaly BREAST:B/L gynecomastia noted. LUNGS: clear to auscultation and percussion HEART: regular rate & rhythm, no murmurs, no gallops, S1 normal and S2 normal ABDOMEN:abdomen soft, non-tender, obese, normal bowel sounds and no masses or organomegaly BACK: Back symmetric, no curvature., No CVA tenderness EXTREMITIES:less then 2 second capillary refill, no joint deformities, effusion, or inflammation, no edema, no skin discoloration, no clubbing, no cyanosis  NEURO: alert & oriented x 3 with fluent speech, no focal motor/sensory deficits, gait normal   LABORATORY DATA: CBC    Component  Value Date/Time   WBC 5.5 06/07/2015 1600   RBC 4.32 06/07/2015 1600   RBC 4.63 10/26/2014 1600   HGB 13.5 06/07/2015 1600   HCT 40.5 06/07/2015 1600   PLT 162 06/07/2015 1600   MCV 93.8 06/07/2015 1600   MCH 31.3 06/07/2015 1600   MCHC 33.3 06/07/2015 1600   RDW 12.7 06/07/2015 1600   LYMPHSABS 1.5 06/07/2015 1600   MONOABS 0.6 06/07/2015 1600   EOSABS 0.3 06/07/2015 1600   BASOSABS 0.0 06/07/2015 1600      Chemistry      Component Value Date/Time   NA 141 09/15/2015 1630   NA 140 06/07/2015 1600   K 3.9 09/15/2015 1630   CL 99 09/15/2015 1630   CO2 26 09/15/2015 1630   BUN 14 09/15/2015 1630   BUN 22* 06/07/2015 1600   CREATININE 0.92 09/15/2015 1630   CREATININE 0.89 03/11/2013 1202      Component Value Date/Time   CALCIUM 9.6 09/15/2015 1630   ALKPHOS 88 09/15/2015 1630   AST 42* 09/15/2015 1630   ALT 52* 09/15/2015 1630   BILITOT 0.6 09/15/2015 1630   BILITOT 0.9 06/07/2015 1600     Results for JACORIAN, GOLASZEWSKI (MRN [id number]) as of 10/29/2015 13:22  Ref. Range 06/07/2015 16:00  LDH Latest Ref Range: 98-192 U/L 168    Results for ELSTER, CORBELLO (MRN [id number]) as of 10/29/2015 13:22  Ref. Range 06/07/2015 16:00  Beta-2 Microglobulin Latest Ref Range: 0.6-2.4 mg/L 1.9  CRP Latest Ref Range: <1.0 mg/dL 0.6   Results for Mcghee,  HENLEY BOETTNER (MRN [id number]) as of 10/29/2015 13:22  Ref. Range 06/07/2015 16:00  Sed Rate Latest Ref Range: 0-16 mm/hr 15    RADIOGRAPHIC STUDIES:  No results found.   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    Labs today: CBC diff, CMET, LDH, ESR, CRP, B2M  Repeat labs  in 12 months: CBC diff, CMET, LDH, ESR, B2M.    Chart reviewed.  No new changes.  He denies any complaints.  He denies any B symptoms.  He continues to work.  Return in 12 months for follow-up per NCCN guidelines recommendation for surveillance.  No role for surveillance imaging, but will perform if clinically indicated in the future.  He has two skin lesions that will need followed.  He has a left posterior helix ear skin lesion that has a healed scab in place that he reports is stable, but he picks at it and it bleeds.  He is advised to leave it be and let it heal.  If it persists, he will need to be referred to dermatology.  Additional right postauricular subcutaneous nodule with white surface is raised, nontender.  He reports it is very stable without any changes over years.  If changes are noted, he knows to call us or his primary care provider for further evaluation and/or referral to dermatology for removal.   THERAPY PLAN:  NCCN guidelines recommends the follow surveillance for Stage I-IV NHL for those who attain a complete response to therapy:  A. H&P every 3-6 months for 5 years, then yearly or as clinically indicated.  B. Labs every 3-6 months for 5 years and then annually or as clinically indicated.  C. Repeat CT scans only as clinically indicated.   All questions were answered. The patient knows to call the clinic with any problems, questions or concerns. We can certainly see the patient much sooner if necessary.  Patient and plan discussed with Dr. Ancil Linsey and she is in agreement with the aforementioned.   Onnika Siebel 11/01/2015 10:56 AM

## 2015-11-01 ENCOUNTER — Encounter (HOSPITAL_COMMUNITY): Payer: Self-pay | Admitting: Oncology

## 2015-11-01 ENCOUNTER — Ambulatory Visit (HOSPITAL_COMMUNITY): Payer: 59 | Admitting: Hematology & Oncology

## 2015-11-01 ENCOUNTER — Encounter (HOSPITAL_BASED_OUTPATIENT_CLINIC_OR_DEPARTMENT_OTHER): Payer: BLUE CROSS/BLUE SHIELD | Admitting: Oncology

## 2015-11-01 ENCOUNTER — Encounter (HOSPITAL_COMMUNITY): Payer: BLUE CROSS/BLUE SHIELD | Attending: Hematology & Oncology

## 2015-11-01 ENCOUNTER — Telehealth (HOSPITAL_COMMUNITY): Payer: Self-pay | Admitting: Emergency Medicine

## 2015-11-01 VITALS — BP 104/70 | HR 71 | Temp 97.5°F | Resp 18 | Wt 301.8 lb

## 2015-11-01 DIAGNOSIS — Z95828 Presence of other vascular implants and grafts: Secondary | ICD-10-CM

## 2015-11-01 DIAGNOSIS — Z923 Personal history of irradiation: Secondary | ICD-10-CM | POA: Diagnosis not present

## 2015-11-01 DIAGNOSIS — C8339 Diffuse large B-cell lymphoma, extranodal and solid organ sites: Secondary | ICD-10-CM

## 2015-11-01 DIAGNOSIS — Z08 Encounter for follow-up examination after completed treatment for malignant neoplasm: Secondary | ICD-10-CM | POA: Insufficient documentation

## 2015-11-01 DIAGNOSIS — Z8579 Personal history of other malignant neoplasms of lymphoid, hematopoietic and related tissues: Secondary | ICD-10-CM | POA: Diagnosis not present

## 2015-11-01 DIAGNOSIS — Z9221 Personal history of antineoplastic chemotherapy: Secondary | ICD-10-CM | POA: Diagnosis not present

## 2015-11-01 DIAGNOSIS — C8330 Diffuse large B-cell lymphoma, unspecified site: Secondary | ICD-10-CM | POA: Diagnosis not present

## 2015-11-01 LAB — COMPREHENSIVE METABOLIC PANEL
ALK PHOS: 84 U/L (ref 38–126)
ALT: 57 U/L (ref 17–63)
AST: 45 U/L — AB (ref 15–41)
Albumin: 4 g/dL (ref 3.5–5.0)
Anion gap: 5 (ref 5–15)
BUN: 15 mg/dL (ref 6–20)
CALCIUM: 9.2 mg/dL (ref 8.9–10.3)
CHLORIDE: 104 mmol/L (ref 101–111)
CO2: 31 mmol/L (ref 22–32)
CREATININE: 0.9 mg/dL (ref 0.61–1.24)
GFR calc Af Amer: >60 mL/min (ref >60)
GFR calc non Af Amer: >60 mL/min (ref >60)
GLUCOSE: 89 mg/dL (ref 65–99)
Potassium: 4.2 mmol/L (ref 3.5–5.1)
SODIUM: 140 mmol/L (ref 135–145)
Total Bilirubin: 0.7 mg/dL (ref 0.3–1.2)
Total Protein: 7.8 g/dL (ref 6.5–8.1)

## 2015-11-01 LAB — CBC WITH DIFFERENTIAL/PLATELET
BASOS PCT: 0 %
Basophils Absolute: 0 10*3/uL (ref 0.0–0.1)
EOS ABS: 0.3 10*3/uL (ref 0.0–0.7)
Eosinophils Relative: 4 %
HEMATOCRIT: 43.3 % (ref 39.0–52.0)
Hemoglobin: 14.4 g/dL (ref 13.0–17.0)
LYMPHS ABS: 1.6 10*3/uL (ref 0.7–4.0)
Lymphocytes Relative: 23 %
MCH: 30.8 pg (ref 26.0–34.0)
MCHC: 33.3 g/dL (ref 30.0–36.0)
MCV: 92.5 fL (ref 78.0–100.0)
MONO ABS: 0.7 10*3/uL (ref 0.1–1.0)
MONOS PCT: 10 %
NEUTROS ABS: 4.3 10*3/uL (ref 1.7–7.7)
Neutrophils Relative %: 63 %
Platelets: 164 10*3/uL (ref 150–400)
RBC: 4.68 MIL/uL (ref 4.22–5.81)
RDW: 12.6 % (ref 11.5–15.5)
WBC: 6.9 10*3/uL (ref 4.0–10.5)

## 2015-11-01 LAB — SEDIMENTATION RATE: Sed Rate: 22 mm/hr — ABNORMAL HIGH (ref 0–16)

## 2015-11-01 LAB — C-REACTIVE PROTEIN: CRP: 0.9 mg/dL (ref <1.0)

## 2015-11-01 LAB — LACTATE DEHYDROGENASE: LDH: 138 U/L (ref 98–192)

## 2015-11-01 MED ORDER — HEPARIN SOD (PORK) LOCK FLUSH 100 UNIT/ML IV SOLN
INTRAVENOUS | Status: AC
  Filled 2015-11-01: qty 5

## 2015-11-01 MED ORDER — HEPARIN SOD (PORK) LOCK FLUSH 100 UNIT/ML IV SOLN
500.0000 [IU] | Freq: Once | INTRAVENOUS | Status: AC
  Administered 2015-11-01: 500 [IU] via INTRAVENOUS
  Filled 2015-11-01: qty 5

## 2015-11-01 MED ORDER — SODIUM CHLORIDE 0.9 % IJ SOLN
10.0000 mL | Freq: Once | INTRAMUSCULAR | Status: AC
  Administered 2015-11-01: 10 mL via INTRAVENOUS

## 2015-11-01 NOTE — Progress Notes (Signed)
Matthew Irwin presented for Portacath access and flush. Proper placement of portacath confirmed by CXR. Portacath located left chest wall accessed with  H 20 needle. Good blood return present. Portacath flushed with 20ml NS and 500U/5ml Heparin and needle removed intact. Procedure without incident. Patient tolerated procedure well.   

## 2015-11-01 NOTE — Patient Instructions (Signed)
[place] at [facility] Discharge Instructions  RECOMMENDATIONS MADE BY THE CONSULTANT AND ANY TEST RESULTS WILL BE SENT TO YOUR REFERRING PHYSICIAN.    Exam completed by Kirby Crigler today Return to see Dr Whitney Muse in 1 year Labs in 1 year Port flushes every 6-8 weeks Please call the clinic if you have any questions or concerns     Thank you for choosing [place] at [facility] to provide your oncology and hematology care.  To afford each patient quality time with our provider, please arrive at least 15 minutes before your scheduled appointment time.    You need to re-schedule your appointment should you arrive 10 or more minutes late.  We strive to give you quality time with our providers, and arriving late affects you and other patients whose appointments are after yours.  Also, if you no show three or more times for appointments you may be dismissed from the clinic at the providers discretion.     Again, thank you for choosing [facility].  Our hope is that these requests will decrease the amount of time that you wait before being seen by our physicians.       _____________________________________________________________  Should you have questions after your visit to [facility], please contact our office at [phone] between the hours of 8:30 a.m. and 4:30 p.m.  Voicemails left after 4:30 p.m. will not be returned until the following business day.  For prescription refill requests, have your pharmacy contact our office.

## 2015-11-01 NOTE — Telephone Encounter (Signed)
Notified pt that labs were normal or stable

## 2015-11-01 NOTE — Telephone Encounter (Signed)
-----   Message from Baird Cancer, PA-C sent at 11/01/2015  1:05 PM EST ----- I have reviewed all lab results which are normal or stable. Please inform the patient.  Some labs are pending.

## 2015-11-02 ENCOUNTER — Telehealth (HOSPITAL_COMMUNITY): Payer: Self-pay | Admitting: Emergency Medicine

## 2015-11-02 LAB — BETA 2 MICROGLOBULIN, SERUM: BETA 2 MICROGLOBULIN: 2.3 mg/L (ref 0.6–2.4)

## 2015-11-02 NOTE — Telephone Encounter (Signed)
Notified pt of lab results, pt verbalized understanding.

## 2015-11-02 NOTE — Telephone Encounter (Signed)
-----   Message from Baird Cancer, PA-C sent at 11/02/2015  3:57 PM EST ----- I have reviewed all lab results which are normal or stable. Please inform the patient.

## 2015-12-27 ENCOUNTER — Encounter (HOSPITAL_COMMUNITY): Payer: BLUE CROSS/BLUE SHIELD

## 2015-12-27 ENCOUNTER — Encounter (HOSPITAL_COMMUNITY): Payer: BLUE CROSS/BLUE SHIELD | Attending: Hematology & Oncology

## 2015-12-27 ENCOUNTER — Encounter (HOSPITAL_COMMUNITY): Payer: Self-pay

## 2015-12-27 DIAGNOSIS — Z452 Encounter for adjustment and management of vascular access device: Secondary | ICD-10-CM

## 2015-12-27 DIAGNOSIS — Z923 Personal history of irradiation: Secondary | ICD-10-CM | POA: Insufficient documentation

## 2015-12-27 DIAGNOSIS — Z8579 Personal history of other malignant neoplasms of lymphoid, hematopoietic and related tissues: Secondary | ICD-10-CM | POA: Insufficient documentation

## 2015-12-27 DIAGNOSIS — C8330 Diffuse large B-cell lymphoma, unspecified site: Secondary | ICD-10-CM

## 2015-12-27 DIAGNOSIS — Z9221 Personal history of antineoplastic chemotherapy: Secondary | ICD-10-CM | POA: Insufficient documentation

## 2015-12-27 DIAGNOSIS — Z08 Encounter for follow-up examination after completed treatment for malignant neoplasm: Secondary | ICD-10-CM | POA: Insufficient documentation

## 2015-12-27 MED ORDER — SODIUM CHLORIDE 0.9% FLUSH
10.0000 mL | INTRAVENOUS | Status: DC | PRN
  Administered 2015-12-27: 10 mL via INTRAVENOUS
  Filled 2015-12-27: qty 10

## 2015-12-27 MED ORDER — HEPARIN SOD (PORK) LOCK FLUSH 100 UNIT/ML IV SOLN
500.0000 [IU] | Freq: Once | INTRAVENOUS | Status: AC
  Administered 2015-12-27: 500 [IU] via INTRAVENOUS

## 2015-12-27 NOTE — Patient Instructions (Signed)
[place] at [facility] Discharge Instructions  RECOMMENDATIONS MADE BY THE CONSULTANT AND ANY TEST RESULTS WILL BE SENT TO YOUR REFERRING PHYSICIAN.  Port flush today. Return as scheduled for port flushes. Return as scheduled for office visit.   Thank you for choosing [place] at [facility] to provide your oncology and hematology care.  To afford each patient quality time with our provider, please arrive at least 15 minutes before your scheduled appointment time.   Beginning January 23rd 2017 lab work for the [facility] will be done in the  Main lab at [facility] on 1st floor. If you have a lab appointment with the [place] please come in thru the  Main Entrance and check in at the main information desk  You need to re-schedule your appointment should you arrive 10 or more minutes late.  We strive to give you quality time with our providers, and arriving late affects you and other patients whose appointments are after yours.  Also, if you no show three or more times for appointments you may be dismissed from the clinic at the providers discretion.     Again, thank you for choosing [facility].  Our hope is that these requests will decrease the amount of time that you wait before being seen by our physicians.       _____________________________________________________________  Should you have questions after your visit to [facility], please contact our office at [phone] between the hours of 8:30 a.m. and 4:30 p.m.  Voicemails left after 4:30 p.m. will not be returned until the following business day.  For prescription refill requests, have your pharmacy contact our office.         Resources For Cancer Patients and their Caregivers ? American Cancer Society: Can assist with transportation, wigs, general needs, runs Look Good Feel Better.        959-256-6058 ? Cancer Care: Provides financial  assistance, online support groups, medication/co-pay assistance.  1-800-813-HOPE 513-265-7921) ? [facility] Assists [place] cancer patients and their families through emotional , educational and financial support.  (510)384-6922 ? [place] DSS Where to apply for food stamps, Medicaid and utility assistance. 671-595-2146 ? [facility]: Transportation to medical appointments. 316-570-9907 ? Social Security Administration: May apply for disability if have a Stage IV cancer. (626)369-4578 (709)568-7403 ? [employer], Disability and Transit Services: Assists with nutrition, care and transit needs. 979-368-1944

## 2015-12-27 NOTE — Progress Notes (Signed)
1620:  Matthew Irwin presented for Portacath access and flush.  Portacath located left chest wall accessed with  H 20 needle.  Good blood return present. Portacath flushed with 31ml NS and 500U/76ml Heparin and needle removed intact.  Procedure tolerated well and without incident.

## 2016-01-29 ENCOUNTER — Other Ambulatory Visit: Payer: Self-pay | Admitting: Nurse Practitioner

## 2016-01-29 NOTE — Telephone Encounter (Signed)
Please call in clonazepM with 1 refills

## 2016-01-29 NOTE — Telephone Encounter (Signed)
Last filled 02/14/207. Last seen 09/15/2015. If approved please route to pool and have nurse call into pharmacy.

## 2016-02-21 ENCOUNTER — Encounter (HOSPITAL_COMMUNITY): Payer: Self-pay

## 2016-02-21 ENCOUNTER — Encounter (HOSPITAL_COMMUNITY): Payer: BLUE CROSS/BLUE SHIELD | Attending: Hematology & Oncology

## 2016-02-21 ENCOUNTER — Encounter (HOSPITAL_COMMUNITY): Payer: BLUE CROSS/BLUE SHIELD

## 2016-02-21 DIAGNOSIS — C8330 Diffuse large B-cell lymphoma, unspecified site: Secondary | ICD-10-CM

## 2016-02-21 DIAGNOSIS — Z452 Encounter for adjustment and management of vascular access device: Secondary | ICD-10-CM | POA: Diagnosis not present

## 2016-02-21 MED ORDER — HEPARIN SOD (PORK) LOCK FLUSH 100 UNIT/ML IV SOLN
500.0000 [IU] | Freq: Once | INTRAVENOUS | Status: AC
  Administered 2016-02-21: 500 [IU] via INTRAVENOUS

## 2016-02-21 MED ORDER — SODIUM CHLORIDE 0.9% FLUSH
10.0000 mL | INTRAVENOUS | Status: DC | PRN
  Administered 2016-02-21: 10 mL via INTRAVENOUS
  Filled 2016-02-21: qty 10

## 2016-02-21 NOTE — Patient Instructions (Signed)
[place] at [facility] Discharge Instructions  RECOMMENDATIONS MADE BY THE CONSULTANT AND ANY TEST RESULTS WILL BE SENT TO YOUR REFERRING PHYSICIAN.  Port flush today. Return as scheduled for port flushes.  Thank you for choosing [place] at [facility] to provide your oncology and hematology care.  To afford each patient quality time with our provider, please arrive at least 15 minutes before your scheduled appointment time.   Beginning January 23rd 2017 lab work for the [facility] will be done in the  Main lab at [facility] on 1st floor. If you have a lab appointment with the [facility] please come in thru the  Main Entrance and check in at the main information desk  You need to re-schedule your appointment should you arrive 10 or more minutes late.  We strive to give you quality time with our providers, and arriving late affects you and other patients whose appointments are after yours.  Also, if you no show three or more times for appointments you may be dismissed from the clinic at the providers discretion.     Again, thank you for choosing [facility].  Our hope is that these requests will decrease the amount of time that you wait before being seen by our physicians.       _____________________________________________________________  Should you have questions after your visit to [facility], please contact our office at [phone] between the hours of 8:30 a.m. and 4:30 p.m.  Voicemails left after 4:30 p.m. will not be returned until the following business day.  For prescription refill requests, have your pharmacy contact our office.         Resources For Cancer Patients and their Caregivers ? American Cancer Society: Can assist with transportation, wigs, general needs, runs Look Good Feel Better.        617-750-9574 ? Cancer Care: Provides financial assistance, online support groups,  medication/co-pay assistance.  1-800-813-HOPE (907) 419-1139) ? [place] Assists [place] cancer patients and their families through emotional , educational and financial support.  7698146333 ? [place] DSS Where to apply for food stamps, Medicaid and utility assistance. 236-347-1948 ? RCATS: Transportation to medical appointments. 825-309-8630 ? Social Security Administration: May apply for disability if have a Stage IV cancer. ([phone] 947-157-4600 ? [employer], Disability and Transit Services: Assists with nutrition, care and transit needs. [phone]

## 2016-02-21 NOTE — Progress Notes (Signed)
Benedicto E Chittum presented for Portacath access and flush.   Portacath located left chest wall accessed with  H 20 needle.  Good blood return present. Portacath flushed with 20ml NS and 500U/5ml Heparin and needle removed intact.  Procedure tolerated well and without incident.   

## 2016-03-11 ENCOUNTER — Other Ambulatory Visit: Payer: Self-pay | Admitting: Nurse Practitioner

## 2016-03-11 NOTE — Telephone Encounter (Signed)
Last seen 09/15/15  MMM

## 2016-03-12 NOTE — Telephone Encounter (Signed)
Last refill without being seen 

## 2016-03-25 ENCOUNTER — Other Ambulatory Visit: Payer: Self-pay | Admitting: Nurse Practitioner

## 2016-03-25 NOTE — Telephone Encounter (Signed)
Last seen 09/15/15  MMM

## 2016-03-25 NOTE — Telephone Encounter (Signed)
Last refill without being seen 

## 2016-04-16 ENCOUNTER — Other Ambulatory Visit: Payer: Self-pay | Admitting: Nurse Practitioner

## 2016-04-17 ENCOUNTER — Encounter (HOSPITAL_COMMUNITY): Payer: BLUE CROSS/BLUE SHIELD

## 2016-04-17 ENCOUNTER — Encounter (HOSPITAL_COMMUNITY): Payer: BLUE CROSS/BLUE SHIELD | Attending: Hematology & Oncology

## 2016-04-17 DIAGNOSIS — Z923 Personal history of irradiation: Secondary | ICD-10-CM | POA: Insufficient documentation

## 2016-04-17 DIAGNOSIS — Z08 Encounter for follow-up examination after completed treatment for malignant neoplasm: Secondary | ICD-10-CM | POA: Insufficient documentation

## 2016-04-17 DIAGNOSIS — Z9221 Personal history of antineoplastic chemotherapy: Secondary | ICD-10-CM | POA: Insufficient documentation

## 2016-04-17 DIAGNOSIS — Z8579 Personal history of other malignant neoplasms of lymphoid, hematopoietic and related tissues: Secondary | ICD-10-CM | POA: Insufficient documentation

## 2016-05-01 ENCOUNTER — Other Ambulatory Visit: Payer: Self-pay | Admitting: Nurse Practitioner

## 2016-05-01 NOTE — Telephone Encounter (Signed)
Last refill without being seen 

## 2016-05-01 NOTE — Telephone Encounter (Signed)
Pt aware ntbs, has appt on 7/18

## 2016-05-07 ENCOUNTER — Encounter: Payer: Self-pay | Admitting: Nurse Practitioner

## 2016-05-07 ENCOUNTER — Ambulatory Visit (INDEPENDENT_AMBULATORY_CARE_PROVIDER_SITE_OTHER): Payer: BLUE CROSS/BLUE SHIELD | Admitting: Nurse Practitioner

## 2016-05-07 VITALS — BP 105/69 | HR 74 | Temp 97.6°F | Ht 70.0 in | Wt 302.0 lb

## 2016-05-07 DIAGNOSIS — I509 Heart failure, unspecified: Secondary | ICD-10-CM

## 2016-05-07 DIAGNOSIS — E785 Hyperlipidemia, unspecified: Secondary | ICD-10-CM | POA: Diagnosis not present

## 2016-05-07 DIAGNOSIS — I10 Essential (primary) hypertension: Secondary | ICD-10-CM

## 2016-05-07 DIAGNOSIS — C8339 Diffuse large B-cell lymphoma, extranodal and solid organ sites: Secondary | ICD-10-CM

## 2016-05-07 DIAGNOSIS — G4700 Insomnia, unspecified: Secondary | ICD-10-CM

## 2016-05-07 DIAGNOSIS — I311 Chronic constrictive pericarditis: Secondary | ICD-10-CM | POA: Diagnosis not present

## 2016-05-07 DIAGNOSIS — Z1212 Encounter for screening for malignant neoplasm of rectum: Secondary | ICD-10-CM | POA: Diagnosis not present

## 2016-05-07 DIAGNOSIS — Z91048 Other nonmedicinal substance allergy status: Secondary | ICD-10-CM | POA: Diagnosis not present

## 2016-05-07 DIAGNOSIS — Z9109 Other allergy status, other than to drugs and biological substances: Secondary | ICD-10-CM

## 2016-05-07 DIAGNOSIS — F411 Generalized anxiety disorder: Secondary | ICD-10-CM

## 2016-05-07 MED ORDER — ATORVASTATIN CALCIUM 40 MG PO TABS: 40.0000 mg | ORAL_TABLET | Freq: Every day | ORAL | Status: DC

## 2016-05-07 MED ORDER — CLONAZEPAM 0.5 MG PO TABS: 0.5000 mg | ORAL_TABLET | Freq: Two times a day (BID) | ORAL | Status: DC

## 2016-05-07 MED ORDER — ESCITALOPRAM OXALATE 20 MG PO TABS: 20.0000 mg | ORAL_TABLET | Freq: Every day | ORAL | Status: DC

## 2016-05-07 MED ORDER — CARVEDILOL 3.125 MG PO TABS: 3.1250 mg | ORAL_TABLET | Freq: Two times a day (BID) | ORAL | Status: DC

## 2016-05-07 MED ORDER — LOSARTAN POTASSIUM 25 MG PO TABS: 25.0000 mg | ORAL_TABLET | Freq: Every day | ORAL | Status: DC

## 2016-05-07 MED ORDER — CETIRIZINE HCL 10 MG PO TABS: ORAL_TABLET | ORAL | Status: DC

## 2016-05-07 NOTE — Patient Instructions (Signed)

## 2016-05-07 NOTE — Progress Notes (Signed)
Subjective:    Patient ID: Matthew Irwin, male    DOB: April 29, 1956, 60 y.o.   MRN: [id number]   Patient here today for follow up of chronic medical problems.  Outpatient Encounter Prescriptions as of 05/07/2016  Medication Sig  . aspirin 81 MG tablet Take 81 mg by mouth daily.    Marland Kitchen atorvastatin (LIPITOR) 40 MG tablet Take 1 tablet (40 mg total) by mouth daily at 6 PM.  . carvedilol (COREG) 3.125 MG tablet Take 1 tablet (3.125 mg total) by mouth 2 (two) times daily with a mea l.  . cetirizine (ZYRTEC) 10 MG tablet TAKE 1 TABLET DAILY  . clonazePAM (KLONOPIN) 0.5 MG tablet TAKE  (1)  TABLET TWICE A DAY.  Marland Kitchen escitalopram (LEXAPRO) 20 MG tablet Take 1 tablet (20 mg total) by mouth daily.  Marland Kitchen losartan (COZAAR) 25 MG tablet Take 1 tablet (25 mg total) by mouth daily.  . Multiple Vitamins-Minerals (CENTRUM SILVER ULTRA MENS) TABS Take by mouth.    . Omega-3 Fatty Acids (FISH OIL) 1000 MG CAPS Take 1 capsule by mouth 2 (two) times daily.    . traZODone (DESYREL) 100 MG tablet Take 1 tablet (100 mg total) by mouth at bedtime.   No facility-administered encounter medications on file as of 05/07/2016.    Hypertension This is a chronic problem. The current episode started more than 1 year ago. The problem is controlled. Associated symptoms include shortness of breath (Reports SOB when walking in incline--reports this has not changed since his heart surgery. ). Pertinent negatives include no chest pain, headaches, neck pain or palpitations. Risk factors for coronary artery disease include dyslipidemia, family history, male gender, obesity and stress. Past treatments include angiotensin blockers. The current treatment provides significant improvement. Compliance problems include diet and exercise.   Hyperlipidemia This is a chronic problem. The problem is uncontrolled. Recent lipid tests were reviewed and are high. Exacerbating diseases include obesity. He has no history of diabetes or hypothyroidism. Associated  symptoms include shortness of breath (Reports SOB when walking in incline--reports this has not changed since his heart surgery. ). Pertinent negatives include no chest pain or myalgias. Current antihyperlipidemic treatment includes statins. The current treatment provides moderate improvement of lipids. Compliance problems include adherence to diet and adherence to exercise.  Risk factors for coronary artery disease include dyslipidemia, hypertension, male sex and obesity.  Depression Lexapro and clonazepam- Working well- No c/o side effects insomnia trazadone helps him sleep at night. Feels rested in AM Lymphoma Hx of  In spinal region- sees oncologist frequently- still in remission- no c/o back pain CHF with constrictive pericrditis Sees cardiologist yearly- has been doing well without chest pain  Review of Systems  Constitutional: Negative for activity change, appetite change, fatigue and unexpected weight change.  HENT: Negative.   Eyes: Negative.  Negative for visual disturbance.  Respiratory: Positive for shortness of breath (Reports SOB when walking in incline--reports this has not changed since his heart surgery. ). Negative for cough, chest tightness and wheezing.   Cardiovascular: Negative for chest pain, palpitations and leg swelling.  Musculoskeletal: Negative for myalgias and neck pain.  Neurological: Negative for headaches.  All other systems reviewed and are negative.      Objective:   Physical Exam  Constitutional: He is oriented to person, place, and time. He appears well-developed and well-nourished.  HENT:  Head: Normocephalic.  Right Ear: External ear normal.  Left Ear: External ear normal.  Nose: Nose normal.  Mouth/Throat: Oropharynx is clear and  moist.  Cerumen obstructing half of canal in left ear.   Eyes: Conjunctivae and EOM are normal. Pupils are equal, round, and reactive to light.  Neck: Normal range of motion. Neck supple. No JVD present. No thyromegaly  present.  Cardiovascular: Normal rate, regular rhythm, normal heart sounds and intact distal pulses.  Exam reveals no gallop and no friction rub.   No murmur heard. Pulmonary/Chest: Effort normal and breath sounds normal. No respiratory distress. He has no wheezes. He has no rales. He exhibits no tenderness.  Abdominal: Soft. Bowel sounds are normal. He exhibits no mass. There is no tenderness.  Musculoskeletal: Normal range of motion. He exhibits no edema.  Lymphadenopathy:    He has no cervical adenopathy.  Neurological: He is alert and oriented to person, place, and time. No cranial nerve deficit.  Skin: Skin is warm and dry.  Dry flaky  Psychiatric: He has a normal mood and affect. His behavior is normal. Judgment and thought content normal.   BP 105/69 mmHg  Pulse 74  Temp(Src) 97.6 F (36.4 C) (Oral)  Ht 5' 10"  (1.778 m)  Wt 302 lb (136.986 kg)  BMI 43.33 kg/m2      Assessment & Plan:  1. Constrictive pericarditis Keep follow up with cardiology  2. Congestive heart failure, unspecified congestive heart failure chronicity, unspecified congestive heart failure type ([place])  3. Essential hypertension Do not ad salt o diet - CMP14+EGFR - losartan (COZAAR) 25 MG tablet; Take 1 tablet (25 mg total) by mouth daily.  Dispense: 30 tablet; Refill: 5 - carvedilol (COREG) 3.125 MG tablet; Take 1 tablet (3.125 mg total) by mouth 2 (two) times daily with a meal.  Dispense: 60 tablet; Refill: 5  4. 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 Hyperlipidemia with target LDL less than 100 Low fat diet - Lipid panel - atorvastatin (LIPITOR) 40 MG tablet; Take 1 tablet (40 mg total) by mouth daily at 6 PM.  Dispense: 30 tablet; Refill: 5  6. GAD (generalized anxiety disorder) Stress management - escitalopram (LEXAPRO) 20 MG tablet; Take 1 tablet (20 mg total) by mouth daily.  Dispense: 30 tablet; Refill: 5 - clonazePAM (KLONOPIN) 0.5 MG tablet; Take 1 tablet  (0.5 mg total) by mouth 2 (two) times daily.  Dispense: 60 tablet; Refill: 2  7. Insomnia Bedtime ritual  8. Morbid obesity, unspecified obesity type ([place]) Discussed diet and exercise for person with BMI >25 Will recheck weight in 3-6 months   9. Environmental allergies - cetirizine (ZYRTEC) 10 MG tablet; TAKE 1 TABLET DAILY  Dispense: 30 tablet; Refill: 5  10. Screening for malignant neoplasm of the rectum - Fecal occult blood, imunochemical; Future    Labs pending Health maintenance reviewed Diet and exercise encouraged Continue all meds Follow up  In 3 month   [place], FNP

## 2016-05-08 ENCOUNTER — Telehealth: Payer: Self-pay

## 2016-05-08 ENCOUNTER — Other Ambulatory Visit: Payer: Self-pay | Admitting: Nurse Practitioner

## 2016-05-08 DIAGNOSIS — R945 Abnormal results of liver function studies: Principal | ICD-10-CM

## 2016-05-08 DIAGNOSIS — R7989 Other specified abnormal findings of blood chemistry: Secondary | ICD-10-CM

## 2016-05-08 LAB — CMP14+EGFR
ALT: 62 IU/L — AB (ref 0–44)
AST: 49 IU/L — AB (ref 0–40)
Albumin/Globulin Ratio: 1.3 (ref 1.2–2.2)
Albumin: 4.1 g/dL (ref 3.5–5.5)
Alkaline Phosphatase: 74 IU/L (ref 39–117)
BUN / CREAT RATIO: 16 (ref 9–20)
BUN: 14 mg/dL (ref 6–24)
Bilirubin Total: 0.6 mg/dL (ref 0.0–1.2)
CALCIUM: 9.4 mg/dL (ref 8.7–10.2)
CO2: 25 mmol/L (ref 18–29)
CREATININE: 0.89 mg/dL (ref 0.76–1.27)
Chloride: 98 mmol/L (ref 96–106)
GFR calc non Af Amer: 94 mL/min/{1.73_m2} (ref >59)
GFR, EST AFRICAN AMERICAN: 108 mL/min/{1.73_m2} (ref >59)
GLUCOSE: 84 mg/dL (ref 65–99)
Globulin, Total: 3.1 g/dL (ref 1.5–4.5)
Potassium: 3.8 mmol/L (ref 3.5–5.2)
SODIUM: 140 mmol/L (ref 134–144)
Total Protein: 7.2 g/dL (ref 6.0–8.5)

## 2016-05-08 LAB — LIPID PANEL
CHOL/HDL RATIO: 3.9 ratio (ref 0.0–5.0)
CHOLESTEROL TOTAL: 134 mg/dL (ref 100–199)
HDL: 34 mg/dL — ABNORMAL LOW (ref >39)
LDL CALC: 54 mg/dL (ref 0–99)
Triglycerides: 232 mg/dL — ABNORMAL HIGH (ref 0–149)
VLDL CHOLESTEROL CAL: 46 mg/dL — AB (ref 5–40)

## 2016-05-08 NOTE — Telephone Encounter (Signed)
[facility] to Referrals for ultrasound appointment

## 2016-05-09 ENCOUNTER — Telehealth: Payer: Self-pay | Admitting: Nurse Practitioner

## 2016-05-09 NOTE — Telephone Encounter (Signed)
Spoke with patient - wanted to know about u/u ordered to check liver- just want to make sure nothing is going on with liver since LFT continue  To be elevated.

## 2016-05-13 ENCOUNTER — Ambulatory Visit (HOSPITAL_COMMUNITY)
Admission: RE | Admit: 2016-05-13 | Discharge: 2016-05-13 | Disposition: A | Discharge status: Home / Self Care | Payer: BLUE CROSS/BLUE SHIELD | Source: Ambulatory Visit | Attending: Nurse Practitioner | Admitting: Nurse Practitioner

## 2016-05-13 DIAGNOSIS — R7989 Other specified abnormal findings of blood chemistry: Secondary | ICD-10-CM | POA: Insufficient documentation

## 2016-05-13 DIAGNOSIS — R945 Abnormal results of liver function studies: Secondary | ICD-10-CM

## 2016-05-21 ENCOUNTER — Other Ambulatory Visit: Payer: BLUE CROSS/BLUE SHIELD

## 2016-05-21 DIAGNOSIS — Z1212 Encounter for screening for malignant neoplasm of rectum: Secondary | ICD-10-CM

## 2016-05-23 LAB — FECAL OCCULT BLOOD, IMMUNOCHEMICAL: Fecal Occult Bld: NEGATIVE

## 2016-05-28 ENCOUNTER — Other Ambulatory Visit: Payer: Self-pay | Admitting: Nurse Practitioner

## 2016-07-02 ENCOUNTER — Ambulatory Visit (INDEPENDENT_AMBULATORY_CARE_PROVIDER_SITE_OTHER): Payer: BLUE CROSS/BLUE SHIELD | Admitting: Nurse Practitioner

## 2016-07-02 ENCOUNTER — Telehealth: Payer: Self-pay | Admitting: Nurse Practitioner

## 2016-07-02 ENCOUNTER — Ambulatory Visit (INDEPENDENT_AMBULATORY_CARE_PROVIDER_SITE_OTHER): Payer: BLUE CROSS/BLUE SHIELD

## 2016-07-02 ENCOUNTER — Other Ambulatory Visit: Payer: Self-pay | Admitting: Nurse Practitioner

## 2016-07-02 ENCOUNTER — Encounter: Payer: Self-pay | Admitting: Nurse Practitioner

## 2016-07-02 VITALS — BP 140/77 | HR 64 | Temp 97.0°F | Ht 70.0 in | Wt 309.0 lb

## 2016-07-02 DIAGNOSIS — M79641 Pain in right hand: Secondary | ICD-10-CM

## 2016-07-02 DIAGNOSIS — G4700 Insomnia, unspecified: Secondary | ICD-10-CM

## 2016-07-02 MED ORDER — ZOLPIDEM TARTRATE 5 MG PO TABS: 5.0000 mg | ORAL_TABLET | Freq: Every evening | ORAL | 2 refills | Status: DC | PRN

## 2016-07-02 NOTE — Progress Notes (Signed)
   Subjective:    Patient ID: Matthew Irwin, male    DOB: 1956/05/27, 60 y.o.   MRN: [id number]  HPI Patient in today c/o pain in right hand- fingers got stiff and difficult to use. Started about 2 weeks ago. Has not taking anything other then ibuprofen which helped some. This has happened in the past and he will usually wear a brace and it will go away. Sometimes it occurs in left hand.  * C/O trouble sleeping- takes trazadone which helps him fall asleep but does not keep him a sleep. He was on ambien in the past but seemd to be to strong.  Review of Systems  Constitutional: Negative.   HENT: Negative.   Respiratory: Negative.   Cardiovascular: Negative.   Genitourinary: Negative.   Neurological: Negative.   Psychiatric/Behavioral: Negative.   All other systems reviewed and are negative.      Objective:   Physical Exam  Constitutional: He is oriented to person, place, and time. He appears well-developed and well-nourished. No distress.  Cardiovascular: Normal rate, regular rhythm and normal heart sounds.   Pulmonary/Chest: Effort normal and breath sounds normal.  Musculoskeletal:  (+) phalen bil hands (-) tinel bil Grips equal bil No swelling  Neurological: He is alert and oriented to person, place, and time.  Skin: Skin is warm.  Psychiatric: He has a normal mood and affect. His behavior is normal. Judgment and thought content normal.    BP 140/77   Pulse 64   Temp 97 F (36.1 C) (Oral)   Ht 5\' 10"  (1.778 m)   Wt (!) 309 lb (140.2 kg)   BMI 44.34 kg/m   Right hand xray- no acute findings- Preliminary reading by Ronnald Collum, FNP  [facility]      Assessment & Plan:  1. Insomnia Bedtime routine-  do not take ambien with trazadone - zolpidem (AMBIEN) 5 MG tablet; Take 1 tablet (5 mg total) by mouth at bedtime as needed for sleep.  Dispense: 30 tablet; Refill: 2  2. Right hand pain Will wait on nerve conduction studies - DG Hand Complete Right; Future - Nerve conduction  test  Mary-Margaret Hassell Done, FNP

## 2016-07-02 NOTE — Telephone Encounter (Signed)
Pt requested appt for numbness in hands appt scheduled

## 2016-07-02 NOTE — Patient Instructions (Signed)

## 2016-07-08 ENCOUNTER — Encounter (HOSPITAL_COMMUNITY): Payer: Self-pay

## 2016-07-08 ENCOUNTER — Encounter (HOSPITAL_COMMUNITY): Payer: BLUE CROSS/BLUE SHIELD | Attending: Hematology & Oncology

## 2016-07-08 DIAGNOSIS — C8330 Diffuse large B-cell lymphoma, unspecified site: Secondary | ICD-10-CM

## 2016-07-08 DIAGNOSIS — Z452 Encounter for adjustment and management of vascular access device: Secondary | ICD-10-CM

## 2016-07-08 MED ORDER — SODIUM CHLORIDE 0.9% FLUSH
10.0000 mL | INTRAVENOUS | Status: DC | PRN
Start: 2016-07-08 — End: 2016-07-08
  Administered 2016-07-08: 10 mL via INTRAVENOUS
  Filled 2016-07-08: qty 10

## 2016-07-08 MED ORDER — HEPARIN SOD (PORK) LOCK FLUSH 100 UNIT/ML IV SOLN
500.0000 [IU] | Freq: Once | INTRAVENOUS | Status: AC
  Administered 2016-07-08: 500 [IU] via INTRAVENOUS

## 2016-07-08 NOTE — Patient Instructions (Signed)
[facility] at [facility] Discharge Instructions  RECOMMENDATIONS MADE BY THE CONSULTANT AND ANY TEST RESULTS WILL BE SENT TO YOUR REFERRING PHYSICIAN.  You had your port flushed today. Return as scheduled.    Thank you for choosing [facility] at [facility] to provide your oncology and hematology care.  To afford each patient quality time with our provider, please arrive at least 15 minutes before your scheduled appointment time.   Beginning January 23rd 2017 lab work for the Cancer Center will be done in the  Main lab at [facility] on 1st floor. If you have a lab appointment with the Cancer Center please come in thru the  Main Entrance and check in at the main information desk  You need to re-schedule your appointment should you arrive 10 or more minutes late.  We strive to give you quality time with our providers, and arriving late affects you and other patients whose appointments are after yours.  Also, if you no show three or more times for appointments you may be dismissed from the clinic at the providers discretion.     Again, thank you for choosing [facility].  Our hope is that these requests will decrease the amount of time that you wait before being seen by our physicians.       _____________________________________________________________  Should you have questions after your visit to [facility], please contact our office at [phone] between the hours of 8:30 a.m. and 4:30 p.m.  Voicemails left after 4:30 p.m. will not be returned until the following business day.  For prescription refill requests, have your pharmacy contact our office.         Resources For Cancer Patients and their Caregivers ? American Cancer Society: Can assist with transportation, wigs, general needs, runs Look Good Feel Better.        1-888-227-6333 ? Cancer Care: Provides financial assistance, online support groups,  medication/co-pay assistance.  1-800-813-HOPE (4673) ? [facility] Assists [place] cancer patients and their families through emotional , educational and financial support.  336-427-4357 ? [place] DSS Where to apply for food stamps, Medicaid and utility assistance. 336-342-1394 ? RCATS: Transportation to medical appointments. 336-347-2287 ? Social Security Administration: May apply for disability if have a Stage IV cancer. 336-342-7796 1-800-772-1213 ? [place] Aging, Disability and Transit Services: Assists with nutrition, care and transit needs. 336-349-2343  Cancer Center Support Programs: [email or web address] > Cancer Support Group  2nd Tuesday of the month 1pm-2pm, Journey Room  > Creative Journey  3rd Tuesday of the month 1130am-1pm, Journey Room  > Look Good Feel Better  1st Wednesday of the month 10am-12 noon, Journey Room (Call American Cancer Society to register 1-800-395-5775)   

## 2016-07-08 NOTE — Progress Notes (Signed)
[name] presented for Portacath access and flush. Portacath located left chest wall accessed with  H 20 needle. Good blood return present. Portacath flushed with 28ml NS and 500U/62ml Heparin and needle removed intact. Procedure without incident. Patient tolerated procedure well. Pt stable and discharged home ambulatory.

## 2016-08-13 ENCOUNTER — Encounter: Payer: Self-pay | Admitting: Nurse Practitioner

## 2016-08-13 ENCOUNTER — Ambulatory Visit (INDEPENDENT_AMBULATORY_CARE_PROVIDER_SITE_OTHER): Payer: BLUE CROSS/BLUE SHIELD | Admitting: Nurse Practitioner

## 2016-08-13 VITALS — BP 113/72 | HR 67 | Temp 97.0°F | Ht 70.0 in | Wt 297.0 lb

## 2016-08-13 DIAGNOSIS — F411 Generalized anxiety disorder: Secondary | ICD-10-CM | POA: Diagnosis not present

## 2016-08-13 DIAGNOSIS — F5101 Primary insomnia: Secondary | ICD-10-CM

## 2016-08-13 DIAGNOSIS — I311 Chronic constrictive pericarditis: Secondary | ICD-10-CM

## 2016-08-13 DIAGNOSIS — E785 Hyperlipidemia, unspecified: Secondary | ICD-10-CM

## 2016-08-13 DIAGNOSIS — C8339 Diffuse large B-cell lymphoma, extranodal and solid organ sites: Secondary | ICD-10-CM | POA: Diagnosis not present

## 2016-08-13 DIAGNOSIS — I509 Heart failure, unspecified: Secondary | ICD-10-CM

## 2016-08-13 DIAGNOSIS — I10 Essential (primary) hypertension: Secondary | ICD-10-CM

## 2016-08-13 DIAGNOSIS — Z23 Encounter for immunization: Secondary | ICD-10-CM

## 2016-08-13 DIAGNOSIS — C83398 Diffuse large b-cell lymphoma of other extranodal and solid organ sites: Secondary | ICD-10-CM

## 2016-08-13 NOTE — Patient Instructions (Signed)
Stress and Stress Management Stress is a normal reaction to life events. It is what you feel when life demands more than you are used to or more than you can handle. Some stress can be useful. For example, the stress reaction can help you catch the last bus of the day, study for a test, or meet a deadline at work. But stress that occurs too often or for too long can cause problems. It can affect your emotional health and interfere with relationships and normal daily activities. Too much stress can weaken your immune system and increase your risk for physical illness. If you already have a medical problem, stress can make it worse. CAUSES  All sorts of life events may cause stress. An event that causes stress for one person may not be stressful for another person. Major life events commonly cause stress. These may be positive or negative. Examples include losing your job, moving into a new home, getting married, having a baby, or losing a loved one. Less obvious life events may also cause stress, especially if they occur day after day or in combination. Examples include working long hours, driving in traffic, caring for children, being in debt, or being in a difficult relationship. SIGNS AND SYMPTOMS Stress may cause emotional symptoms including, the following:  Anxiety. This is feeling worried, afraid, on edge, overwhelmed, or out of control.  Anger. This is feeling irritated or impatient.  Depression. This is feeling sad, down, helpless, or guilty.  Difficulty focusing, remembering, or making decisions. Stress may cause physical symptoms, including the following:   Aches and pains. These may affect your head, neck, back, stomach, or other areas of your body.  Tight muscles or clenched jaw.  Low energy or trouble sleeping. Stress may cause unhealthy behaviors, including the following:   Eating to feel better (overeating) or skipping meals.  Sleeping too little, too much, or both.  Working  too much or putting off tasks (procrastination).  Smoking, drinking alcohol, or using drugs to feel better. DIAGNOSIS  Stress is diagnosed through an assessment by your health care provider. Your health care provider will ask questions about your symptoms and any stressful life events.Your health care provider will also ask about your medical history and may order blood tests or other tests. Certain medical conditions and medicine can cause physical symptoms similar to stress. Mental illness can cause emotional symptoms and unhealthy behaviors similar to stress. Your health care provider may refer you to a mental health professional for further evaluation.  TREATMENT  Stress management is the recommended treatment for stress.The goals of stress management are reducing stressful life events and coping with stress in healthy ways.  Techniques for reducing stressful life events include the following:  Stress identification. Self-monitor for stress and identify what causes stress for you. These skills may help you to avoid some stressful events.  Time management. Set your priorities, keep a calendar of events, and learn to say "no." These tools can help you avoid making too many commitments. Techniques for coping with stress include the following:  Rethinking the problem. Try to think realistically about stressful events rather than ignoring them or overreacting. Try to find the positives in a stressful situation rather than focusing on the negatives.  Exercise. Physical exercise can release both physical and emotional tension. The key is to find a form of exercise you enjoy and do it regularly.  Relaxation techniques. These relax the body and mind. Examples include yoga, meditation, tai chi, biofeedback, deep  breathing, progressive muscle relaxation, listening to music, being out in nature, journaling, and other hobbies. Again, the key is to find one or more that you enjoy and can do  regularly.  Healthy lifestyle. Eat a balanced diet, get plenty of sleep, and do not smoke. Avoid using alcohol or drugs to relax.  Strong support network. Spend time with family, friends, or other people you enjoy being around.Express your feelings and talk things over with someone you trust. Counseling or talktherapy with a mental health professional may be helpful if you are having difficulty managing stress on your own. Medicine is typically not recommended for the treatment of stress.Talk to your health care provider if you think you need medicine for symptoms of stress. HOME CARE INSTRUCTIONS  Keep all follow-up visits as directed by your health care provider.  Take all medicines as directed by your health care provider. SEEK MEDICAL CARE IF:  Your symptoms get worse or you start having new symptoms.  You feel overwhelmed by your problems and can no longer manage them on your own. SEEK IMMEDIATE MEDICAL CARE IF:  You feel like hurting yourself or someone else.   This information is not intended to replace advice given to you by your health care provider. Make sure you discuss any questions you have with your health care provider.   Document Released: 04/02/2001 Document Revised: 10/28/2014 Document Reviewed: 06/01/2013 Elsevier Interactive Patient Education 2016 [employer]  

## 2016-08-13 NOTE — Progress Notes (Signed)
Subjective:    Patient ID: Matthew Irwin, male    DOB: 12/27/1955, 60 y.o.   MRN: [id number]   Patient here today for follow up of chronic medical problems. No chnages since last visit. Has sore place on bottom of left foot. Had plantars wart there in the past.  Outpatient Encounter Prescriptions as of 08/13/2016  Medication Sig  . aspirin 81 MG tablet Take 81 mg by mouth daily.    Marland Kitchen atorvastatin (LIPITOR) 40 MG tablet Take 1 tablet (40 mg total) by mouth daily at 6 PM.  . carvedilol (COREG) 3.125 MG tablet Take 1 tablet (3.125 mg total) by mouth 2 (two) times daily with a meal.  . cetirizine (ZYRTEC) 10 MG tablet TAKE 1 TABLET DAILY  . clonazePAM (KLONOPIN) 0.5 MG tablet Take 1 tablet (0.5 mg total) by mouth 2 (two) times daily.  Marland Kitchen escitalopram (LEXAPRO) 20 MG tablet Take 1 tablet (20 mg total) by mouth daily.  Marland Kitchen losartan (COZAAR) 25 MG tablet Take 1 tablet (25 mg total) by mouth daily.  . Multiple Vitamins-Minerals (CENTRUM SILVER ULTRA MENS) TABS Take by mouth.    . Omega-3 Fatty Acids (FISH OIL) 1000 MG CAPS Take 1 capsule by mouth 2 (two) times daily.    . traZODone (DESYREL) 100 MG tablet Take 1 tablet (100 mg total) by mouth at bedtime.  Marland Kitchen zolpidem (AMBIEN) 5 MG tablet Take 1 tablet (5 mg total) by mouth at bedtime as needed for sleep.   No facility-administered encounter medications on file as of 08/13/2016.     Hypertension  This is a chronic problem. The current episode started more than 1 year ago. The problem is controlled. Associated symptoms include shortness of breath (Reports SOB when walking in incline--reports this has not changed since his heart surgery. ). Pertinent negatives include no chest pain, headaches, neck pain or palpitations. Risk factors for coronary artery disease include dyslipidemia, family history, male gender, obesity and stress. Past treatments include angiotensin blockers. The current treatment provides significant improvement. Compliance problems include  diet and exercise.   Hyperlipidemia  This is a chronic problem. The problem is uncontrolled. Recent lipid tests were reviewed and are high. Exacerbating diseases include obesity. He has no history of diabetes or hypothyroidism. Associated symptoms include shortness of breath (Reports SOB when walking in incline--reports this has not changed since his heart surgery. ). Pertinent negatives include no chest pain or myalgias. Current antihyperlipidemic treatment includes statins. The current treatment provides moderate improvement of lipids. Compliance problems include adherence to diet and adherence to exercise.  Risk factors for coronary artery disease include dyslipidemia, hypertension, male sex and obesity.  Depression Lexapro and clonazepam- Working well- No c/o side effects insomnia trazadone helps him sleep at night. 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 His next follow up appointment is in January.  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  HENT: Negative.   Eyes: Negative.  Negative for visual disturbance.  Respiratory: Positive for shortness of breath (Reports SOB when walking in incline--reports this has not changed since his heart surgery. ). Negative for cough, chest tightness and wheezing.   Cardiovascular: Negative for chest pain, palpitations and leg swelling.  Musculoskeletal: Negative for myalgias and neck pain.  Neurological: Negative for headaches.  All other systems  reviewed and are negative.      Objective:   Physical Exam  Constitutional: He is oriented to person, place, and time. He appears well-developed and well-nourished.  HENT:  Head: Normocephalic.  Right Ear: External ear normal.  Left Ear: External ear  normal.  Nose: Nose normal.  Mouth/Throat: Oropharynx is clear and moist.  Cerumen obstructing half of canal in left ear.   Eyes: Conjunctivae and EOM are normal. Pupils are equal, round, and reactive to light.  Neck: Normal range of motion. Neck supple. No JVD present. No thyromegaly present.  Cardiovascular: Normal rate, regular rhythm, normal heart sounds and intact distal pulses.  Exam reveals no gallop and no friction rub.   No murmur heard. Pulmonary/Chest: Effort normal and breath sounds normal. No respiratory distress. He has no wheezes. He has no rales. He exhibits no tenderness.  Abdominal: Soft. Bowel sounds are normal. He exhibits no mass. There is no tenderness.  Musculoskeletal: Normal range of motion. He exhibits no edema.  Lymphadenopathy:    He has no cervical adenopathy.  Neurological: He is alert and oriented to person, place, and time. No cranial nerve deficit.  Skin: Skin is warm and dry.  1cm dry lesion on bottom of left foot.  Psychiatric: He has a normal mood and affect. His behavior is normal. Judgment and thought content normal.   BP 113/72   Pulse 67   Temp 97 F (36.1 C) (Oral)   Ht 5' 10"  (1.778 m)   Wt 297 lb (134.7 kg)   BMI 42.62 kg/m        Assessment & Plan:  1. 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 Essential hypertension Do not add salt to diet  4. 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 GAD (generalized anxiety disorder) Stress management  6. Hyperlipidemia with target LDL less than 100 Low fat diet  7. Primary insomnia Bedtime routine  8. Severe obesity (BMI >= 40) (HCC) Discussed diet and exercise for person with BMI >25 Will recheck weight in 3-6 months   Orders Placed This  Encounter  Procedures  . CMP14+EGFR  . Lipid panel    Labs pending Health maintenance reviewed Diet and exercise encouraged Continue all meds Follow up  In 3  month  [place], FNP

## 2016-08-14 DIAGNOSIS — Z23 Encounter for immunization: Secondary | ICD-10-CM

## 2016-08-14 LAB — LIPID PANEL
CHOL/HDL RATIO: 4.4 ratio (ref 0.0–5.0)
CHOLESTEROL TOTAL: 153 mg/dL (ref 100–199)
HDL: 35 mg/dL — AB (ref >39)
LDL CALC: 86 mg/dL (ref 0–99)
TRIGLYCERIDES: 159 mg/dL — AB (ref 0–149)
VLDL Cholesterol Cal: 32 mg/dL (ref 5–40)

## 2016-08-14 LAB — CMP14+EGFR
ALK PHOS: 79 IU/L (ref 39–117)
ALT: 58 IU/L — AB (ref 0–44)
AST: 49 IU/L — AB (ref 0–40)
Albumin/Globulin Ratio: 1.3 (ref 1.2–2.2)
Albumin: 4.2 g/dL (ref 3.5–5.5)
BUN/Creatinine Ratio: 11 (ref 9–20)
BUN: 11 mg/dL (ref 6–24)
Bilirubin Total: 0.7 mg/dL (ref 0.0–1.2)
CALCIUM: 9.3 mg/dL (ref 8.7–10.2)
CO2: 27 mmol/L (ref 18–29)
CREATININE: 0.96 mg/dL (ref 0.76–1.27)
Chloride: 99 mmol/L (ref 96–106)
GFR calc Af Amer: 100 mL/min/{1.73_m2} (ref >59)
GFR, EST NON AFRICAN AMERICAN: 86 mL/min/{1.73_m2} (ref >59)
Globulin, Total: 3.3 g/dL (ref 1.5–4.5)
Glucose: 95 mg/dL (ref 65–99)
POTASSIUM: 4.1 mmol/L (ref 3.5–5.2)
Sodium: 140 mmol/L (ref 134–144)
Total Protein: 7.5 g/dL (ref 6.0–8.5)

## 2016-08-19 ENCOUNTER — Encounter (HOSPITAL_COMMUNITY): Payer: BLUE CROSS/BLUE SHIELD | Attending: Hematology & Oncology

## 2016-08-19 VITALS — BP 122/75 | HR 75 | Temp 98.2°F | Resp 18

## 2016-08-19 DIAGNOSIS — Z452 Encounter for adjustment and management of vascular access device: Secondary | ICD-10-CM | POA: Diagnosis not present

## 2016-08-19 DIAGNOSIS — C8330 Diffuse large B-cell lymphoma, unspecified site: Secondary | ICD-10-CM

## 2016-08-19 DIAGNOSIS — Z95828 Presence of other vascular implants and grafts: Secondary | ICD-10-CM | POA: Insufficient documentation

## 2016-08-19 MED ORDER — SODIUM CHLORIDE 0.9% FLUSH
10.0000 mL | INTRAVENOUS | Status: DC | PRN
  Administered 2016-08-19: 10 mL via INTRAVENOUS
  Filled 2016-08-19: qty 10

## 2016-08-19 MED ORDER — HEPARIN SOD (PORK) LOCK FLUSH 100 UNIT/ML IV SOLN
INTRAVENOUS | Status: AC
  Filled 2016-08-19: qty 5

## 2016-08-19 MED ORDER — HEPARIN SOD (PORK) LOCK FLUSH 100 UNIT/ML IV SOLN
500.0000 [IU] | Freq: Once | INTRAVENOUS | Status: AC
  Administered 2016-08-19: 500 [IU] via INTRAVENOUS

## 2016-08-19 NOTE — Progress Notes (Signed)
Matthew Irwin tolerated port flush well without complaints or incident. Port flushed with 10 ml NS and 5 ml Heparin easily per protocol.Pt discharged self ambulatory in satisfactory condition

## 2016-08-19 NOTE — Patient Instructions (Signed)
[facility] at [facility] Discharge Instructions  RECOMMENDATIONS MADE BY THE CONSULTANT AND ANY TEST RESULTS WILL BE SENT TO YOUR REFERRING PHYSICIAN.  Port flushed per protocol today. Follow-up as scheduled. Call clinic for any questions or concerns  Thank you for choosing [facility] to provide your oncology and hematology care.  To afford each patient quality time with our provider, please arrive at least 15 minutes before your scheduled appointment time.   Beginning January 23rd 2017 lab work for the Cancer Center will be done in the  Main lab at [facility] on 1st floor. If you have a lab appointment with the Cancer Center please come in thru the  Main Entrance and check in at the main information desk  You need to re-schedule your appointment should you arrive 10 or more minutes late.  We strive to give you quality time with our providers, and arriving late affects you and other patients whose appointments are after yours.  Also, if you no show three or more times for appointments you may be dismissed from the clinic at the providers discretion.     Again, thank you for choosing [facility].  Our hope is that these requests will decrease the amount of time that you wait before being seen by our physicians.       _____________________________________________________________  Should you have questions after your visit to [facility], please contact our office at [phone] between the hours of 8:30 a.m. and 4:30 p.m.  Voicemails left after 4:30 p.m. will not be returned until the following business day.  For prescription refill requests, have your pharmacy contact our office.         Resources For Cancer Patients and their Caregivers ? American Cancer Society: Can assist with transportation, wigs, general needs, runs Look Good Feel Better.        1-888-227-6333 ? Cancer Care: Provides financial  assistance, online support groups, medication/co-pay assistance.  1-800-813-HOPE (4673) ? [facility] Assists [place] cancer patients and their families through emotional , educational and financial support.  336-427-4357 ? [place] DSS Where to apply for food stamps, Medicaid and utility assistance. 336-342-1394 ? RC[employer]: Transportation to medical appointments. 336-347-2287 ? Social Security Administration: May apply for disability if have a Stage IV cancer. 336-342-7796 1-800-772-1213 ? [place] Aging, Disability and Transit Services: Assists with nutrition, care and transit needs. 336-349-2343  Cancer Center Support Programs: @10RELATIVEDAYS@ > Cancer Support Group  2nd Tuesday of the month 1pm-2pm, Journey Room  > Creative Journey  3rd Tuesday of the month 1130am-1pm, Journey Room  > Look Good Feel Better  1st Wednesday of the month 10am-12 noon, Journey Room (Call American Cancer [employer] to register 1-800-395-5775)    

## 2016-09-27 ENCOUNTER — Other Ambulatory Visit: Payer: Self-pay | Admitting: Nurse Practitioner

## 2016-09-27 DIAGNOSIS — G4700 Insomnia, unspecified: Secondary | ICD-10-CM

## 2016-09-27 NOTE — Telephone Encounter (Signed)
Pt requesting refill on Trazodone and Ambien Last refilled 07/02/2016 Last seen 08/13/2016

## 2016-10-04 ENCOUNTER — Telehealth: Payer: Self-pay | Admitting: Nurse Practitioner

## 2016-10-04 NOTE — Telephone Encounter (Signed)
Called to [facility]. Patient notified

## 2016-10-04 NOTE — Telephone Encounter (Signed)
Please advise on refills.  

## 2016-10-04 NOTE — Telephone Encounter (Signed)
Please call in ambien with 1 refills 

## 2016-10-04 NOTE — Telephone Encounter (Signed)
Was done

## 2016-10-07 NOTE — Telephone Encounter (Signed)
LMOVM that this was taken care of on Friday

## 2016-10-17 ENCOUNTER — Encounter (HOSPITAL_COMMUNITY): Payer: Self-pay

## 2016-10-17 ENCOUNTER — Encounter (HOSPITAL_COMMUNITY): Payer: BLUE CROSS/BLUE SHIELD | Attending: Hematology & Oncology

## 2016-10-17 DIAGNOSIS — C8339 Diffuse large B-cell lymphoma, extranodal and solid organ sites: Secondary | ICD-10-CM

## 2016-10-17 DIAGNOSIS — Z95828 Presence of other vascular implants and grafts: Secondary | ICD-10-CM | POA: Diagnosis present

## 2016-10-17 DIAGNOSIS — C8330 Diffuse large B-cell lymphoma, unspecified site: Secondary | ICD-10-CM | POA: Diagnosis not present

## 2016-10-17 LAB — COMPREHENSIVE METABOLIC PANEL
ALK PHOS: 67 U/L (ref 38–126)
ALT: 57 U/L (ref 17–63)
AST: 51 U/L — AB (ref 15–41)
Albumin: 4.1 g/dL (ref 3.5–5.0)
Anion gap: 6 (ref 5–15)
BILIRUBIN TOTAL: 1 mg/dL (ref 0.3–1.2)
BUN: 15 mg/dL (ref 6–20)
CALCIUM: 9.3 mg/dL (ref 8.9–10.3)
CHLORIDE: 101 mmol/L (ref 101–111)
CO2: 29 mmol/L (ref 22–32)
Creatinine, Ser: 0.85 mg/dL (ref 0.61–1.24)
GFR calc Af Amer: >60 mL/min (ref >60)
Glucose, Bld: 89 mg/dL (ref 65–99)
Potassium: 3.8 mmol/L (ref 3.5–5.1)
Sodium: 136 mmol/L (ref 135–145)
TOTAL PROTEIN: 7.8 g/dL (ref 6.5–8.1)

## 2016-10-17 LAB — CBC WITH DIFFERENTIAL/PLATELET
BASOS ABS: 0 10*3/uL (ref 0.0–0.1)
Basophils Relative: 1 %
Eosinophils Absolute: 0.3 10*3/uL (ref 0.0–0.7)
Eosinophils Relative: 4 %
HEMATOCRIT: 41.9 % (ref 39.0–52.0)
HEMOGLOBIN: 14 g/dL (ref 13.0–17.0)
LYMPHS ABS: 1.3 10*3/uL (ref 0.7–4.0)
LYMPHS PCT: 21 %
MCH: 31.3 pg (ref 26.0–34.0)
MCHC: 33.4 g/dL (ref 30.0–36.0)
MCV: 93.5 fL (ref 78.0–100.0)
Monocytes Absolute: 0.8 10*3/uL (ref 0.1–1.0)
Monocytes Relative: 12 %
NEUTROS ABS: 3.8 10*3/uL (ref 1.7–7.7)
Neutrophils Relative %: 62 %
Platelets: 168 10*3/uL (ref 150–400)
RBC: 4.48 MIL/uL (ref 4.22–5.81)
RDW: 12.6 % (ref 11.5–15.5)
WBC: 6.1 10*3/uL (ref 4.0–10.5)

## 2016-10-17 LAB — SEDIMENTATION RATE: Sed Rate: 13 mm/hr (ref 0–16)

## 2016-10-17 LAB — C-REACTIVE PROTEIN: CRP: <0.8 mg/dL (ref <1.0)

## 2016-10-17 LAB — LACTATE DEHYDROGENASE: LDH: 153 U/L (ref 98–192)

## 2016-10-17 MED ORDER — HEPARIN SOD (PORK) LOCK FLUSH 100 UNIT/ML IV SOLN
500.0000 [IU] | Freq: Once | INTRAVENOUS | Status: AC
  Administered 2016-10-17: 500 [IU] via INTRAVENOUS

## 2016-10-17 MED ORDER — SODIUM CHLORIDE 0.9% FLUSH
20.0000 mL | INTRAVENOUS | Status: DC | PRN
  Administered 2016-10-17: 20 mL via INTRAVENOUS
  Filled 2016-10-17: qty 20

## 2016-10-17 NOTE — Patient Instructions (Signed)
[facility] at [facility] Discharge Instructions  RECOMMENDATIONS MADE BY THE CONSULTANT AND ANY TEST RESULTS WILL BE SENT TO YOUR REFERRING PHYSICIAN.  Port flush with labs  Thank you for choosing [facility] at [facility] to provide your oncology and hematology care.  To afford each patient quality time with our provider, please arrive at least 15 minutes before your scheduled appointment time.    If you have a lab appointment with the Cancer Center please come in thru the  Main Entrance and check in at the main information desk  You need to re-schedule your appointment should you arrive 10 or more minutes late.  We strive to give you quality time with our providers, and arriving late affects you and other patients whose appointments are after yours.  Also, if you no show three or more times for appointments you may be dismissed from the clinic at the providers discretion.     Again, thank you for choosing [facility].  Our hope is that these requests will decrease the amount of time that you wait before being seen by our physicians.       _____________________________________________________________  Should you have questions after your visit to [facility], please contact our office at [phone] between the hours of 8:30 a.m. and 4:30 p.m.  Voicemails left after 4:30 p.m. will not be returned until the following business day.  For prescription refill requests, have your pharmacy contact our office.       Resources For Cancer Patients and their Caregivers ? American Cancer Society: Can assist with transportation, wigs, general needs, runs Look Good Feel Better.        1-888-227-6333 ? Cancer Care: Provides financial assistance, online support groups, medication/co-pay assistance.  1-800-813-HOPE (4673) ? [facility] Assists [place] cancer patients and their families through  emotional , educational and financial support.  336-427-4357 ? [place] DSS Where to apply for food stamps, Medicaid and utility assistance. 336-342-1394 ? RCATS: Transportation to medical appointments. 336-347-2287 ? Social Security Administration: May apply for disability if have a Stage IV cancer. 336-342-7796 1-800-772-1213 ? [place] Aging, Disability and Transit Services: Assists with nutrition, care and transit needs. 336-349-2343  Cancer Center Support Programs: @10RELATIVEDAYS@ > Cancer Support Group  2nd Tuesday of the month 1pm-2pm, Journey Room  > Creative Journey  3rd Tuesday of the month 1130am-1pm, Journey Room  > Look Good Feel Better  1st Wednesday of the month 10am-12 noon, Journey Room (Call American Cancer Society to register 1-800-395-5775)    

## 2016-10-17 NOTE — Progress Notes (Signed)
Matthew Irwin presented for Portacath access and flush. Proper placement of portacath confirmed by CXR. Portacath located left chest wall accessed with  H 20 needle. Good blood return present.  Specimen drawn for labs.  Portacath flushed with 48ml NS and 500U/39ml Heparin and needle removed intact. Procedure without incident. Patient tolerated procedure well.

## 2016-10-18 LAB — BETA 2 MICROGLOBULIN, SERUM: Beta-2 Microglobulin: 2.2 mg/L (ref 0.6–2.4)

## 2016-10-29 ENCOUNTER — Other Ambulatory Visit: Payer: Self-pay | Admitting: Nurse Practitioner

## 2016-11-04 ENCOUNTER — Ambulatory Visit (HOSPITAL_COMMUNITY): Payer: BLUE CROSS/BLUE SHIELD | Admitting: Hematology & Oncology

## 2016-11-11 ENCOUNTER — Encounter (HOSPITAL_COMMUNITY): Payer: BLUE CROSS/BLUE SHIELD | Attending: Hematology & Oncology | Admitting: Adult Health

## 2016-11-11 ENCOUNTER — Encounter (HOSPITAL_COMMUNITY): Payer: Self-pay | Admitting: Adult Health

## 2016-11-11 ENCOUNTER — Encounter (HOSPITAL_COMMUNITY): Payer: Self-pay | Admitting: Lab

## 2016-11-11 VITALS — BP 109/61 | HR 66 | Temp 97.8°F | Resp 18 | Wt 301.9 lb

## 2016-11-11 DIAGNOSIS — C8339 Diffuse large B-cell lymphoma, extranodal and solid organ sites: Secondary | ICD-10-CM | POA: Diagnosis not present

## 2016-11-11 DIAGNOSIS — R740 Nonspecific elevation of levels of transaminase and lactic acid dehydrogenase [LDH]: Secondary | ICD-10-CM

## 2016-11-11 DIAGNOSIS — Z95828 Presence of other vascular implants and grafts: Secondary | ICD-10-CM | POA: Insufficient documentation

## 2016-11-11 NOTE — Patient Instructions (Addendum)
[place] at [facility] Discharge Instructions  RECOMMENDATIONS MADE BY THE CONSULTANT AND ANY TEST RESULTS WILL BE SENT TO YOUR REFERRING PHYSICIAN.  Exam with Mike Craze, NP today. We will refer you to Dr. Arnoldo Morale for port removal.   Return to the clinic for follow up in one year with labs one week prior to return appointment. Please see Amy for return appointments.     Thank you for choosing [place] at [facility] to provide your oncology and hematology care.  To afford each patient quality time with our provider, please arrive at least 15 minutes before your scheduled appointment time.    If you have a lab appointment with the [place] please come in thru the  Main Entrance and check in at the main information desk  You need to re-schedule your appointment should you arrive 10 or more minutes late.  We strive to give you quality time with our providers, and arriving late affects you and other patients whose appointments are after yours.  Also, if you no show three or more times for appointments you may be dismissed from the clinic at the providers discretion.     Again, thank you for choosing [facility].  Our hope is that these requests will decrease the amount of time that you wait before being seen by our physicians.       _____________________________________________________________  Should you have questions after your visit to [facility], please contact our office at [phone] between the hours of 8:30 a.m. and 4:30 p.m.  Voicemails left after 4:30 p.m. will not be returned until the following business day.  For prescription refill requests, have your pharmacy contact our office.       Resources For Cancer Patients and their Caregivers ? American Cancer Society: Can assist with transportation, wigs, general needs, runs Look Good Feel Better.        951-771-3367 ? Cancer  Care: Provides financial assistance, online support groups, medication/co-pay assistance.  1-800-813-HOPE 339 023 5517) ? [facility] Assists [place] cancer patients and their families through emotional , educational and financial support.  484-296-4602 ? [place] DSS Where to apply for food stamps, Medicaid and utility assistance. 503-286-5400 ? RCATS: Transportation to medical appointments. (717) 166-4528 ? Social Security Administration: May apply for disability if have a Stage IV cancer. 947-362-8376 (234)075-4248 ? [employer], Disability and Transit Services: Assists with nutrition, care and transit needs. [place] Support Programs: @10RELATIVEDAYS @ > Cancer Support Group  2nd Tuesday of the month 1pm-2pm, Journey Room  > Creative Journey  3rd Tuesday of the month 1130am-1pm, Journey Room  > Look Good Feel Better  1st Wednesday of the month 10am-12 noon, Journey Room (Call [place] to register [phone])

## 2016-11-11 NOTE — Progress Notes (Unsigned)
Referral to [facility] for port removal. Records faxed on 1/22

## 2016-11-11 NOTE — Progress Notes (Signed)
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      08/29/2008 PET scan    Single area of hypermetabolic activity is identified corresponding to the known soft tissue lesion at the level of T6.      08/30/2008 Bone Marrow Biopsy    Normocellular marrow with trilineage hematopoiesis.      10/10/2008 PET scan    Marked interval decrease in size and activity associated with T6 vertebral body metastasis.      10/17/2008 Procedure    CSF exam shows scattered lympocytes (reactive)      12/12/2008 Remission    PET- The destructive lesion at T6 has metabolic activity similar to surrounding tissues and mildly decreased compared to last exam.      08/24/2009 PET scan    NED.  New pericardial and pleural effusions      01/11/2010 -  Radiation Therapy    Involved field radiation       03/07/2010 PET scan    Medium-sized focus of moderate increased uptake within T7 vertebra in region of previously noted tumor.  Resolution of pericardial effusion.  Increase in left pleural effusion.      03/09/2010 Imaging    MRI T-spine- Acute milkd compression fxn of T7, suspicious for pathologic fracture/recurrence..  Acute mild compression fxn at T4, primarily superior endplate.  Cannot rule out tumor.      03/16/2010 Procedure    T-7 vertebral biopsy shows fibrosis and new bone formation      07/16/2010 Imaging    MRI T-spine- Decreased marrow  edema and enhancement involving fractures at T4 and T7, compatible with healing fxn.        HISTORY OF PRESENT ILLNESS: Matthew Irwin 61 y.o. 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     INTERVAL HISTORY:  Matthew Irwin presents today with his wife for continued follow-up for h/o DLBCL.  Denies any fever/chills or night sweats.  Weight is stable.  He has some chronic dyspnea on exertion since his heart surgery; his PCP is following the dyspnea. It is no worse than his baseline.    He sees his PCP regularly. His last physical was in 07/2016 and he is scheduled to see PCP again next week.  He thinks his PCP checks his PSA values, which have been normal per patient.  He is not sure when he had a colonoscopy.  His immunizations are up-to-date per patient as well.    He would like to discuss having his port-a-cath removed.  He tells me that the port does not bother him per se, but  if he did not have to come to the cancer center every 6-8 weeks for port flush, this would be favorable for him.    2 skin lesions noted at previous visit; 1 to left ear and the other to right posterior auricular area.  The left ear lesion is improved per patient; it occasionally peels and heals, but then recurs.  He does have a dermatologist and is scheduled to see him/her soon for annual skin exam.  He will have his dermatologist take a look at his left ear.  The right posterior auricular area is scar tissue since excision for cyst per patient.    I personally reviewed and went over laboratory results with the patient.  The results are noted within this dictation. They will be  updated today.  His appetite and energy levels are okay. Overall, he feels quite well.      REVIEW OF SYSTEMS:  Review of Systems  Constitutional: Negative.  Negative for chills, fever and unexpected weight change.  HENT:  Negative.   Eyes: Negative.   Respiratory:       Chronic DOE  Cardiovascular: Negative.  Negative for chest pain and palpitations.  Gastrointestinal: Negative.  Negative for abdominal pain, constipation, diarrhea, nausea and vomiting.  Endocrine: Negative.   Genitourinary: Negative.    Musculoskeletal: Negative.   Neurological: Negative.   Hematological: Negative.   Psychiatric/Behavioral: Negative.      PAST MEDICAL/SURGICAL HISTORY:  Past Medical History:  Diagnosis Date  . ACEI/ARB contraindicated   . Anxiety   . Bronchitis   . Cardiomyopathy   . Chest pain    left heart catherization  . CHF (congestive heart failure) ([facility])   . History of cardiac catheterization   . HTN (hypertension)   . Hyperlipidemia    myalgias with pravastatin and simvastatin.  Marland Kitchen NHL (non-Hodgkin's lymphoma) ([facility])    history of  . Obesity 08/16/2011  . Sinus infection   . Subacute effusive constrictive pericarditis    Past Surgical History:  Procedure Laterality Date  . BACK SURGERY  07/2008   tumor removed off spine  . heart sack removed  07/2009  . HEMORRHOID SURGERY    . LEFT HEART CATH  10/10  . NASAL SINUS SURGERY    . PORTACATH PLACEMENT    . TUMOR REMOVAL      CURRENT MEDICATIONS:  Outpatient Encounter Prescriptions as of 11/11/2016  Medication Sig  . aspirin 81 MG tablet Take 81 mg by mouth daily.    Marland Kitchen atorvastatin (LIPITOR) 40 MG tablet Take 1 tablet (40 mg total) by mouth daily at 6 PM.  . carvedilol (COREG) 3.125 MG tablet Take 1 tablet (3.125 mg total) by mouth 2 (two) times daily with a meal.  . cetirizine (ZYRTEC) 10 MG tablet TAKE 1 TABLET DAILY  . clonazePAM (KLONOPIN) 0.5 MG tablet Take 1 tablet (0.5 mg total) by mouth 2 (two) times daily.  Marland Kitchen  escitalopram (LEXAPRO) 20 MG tablet Take 1 tablet (20 mg total) by mouth daily.  Marland Kitchen losartan (COZAAR) 25 MG tablet Take 1 tablet (25 mg total) by mouth daily.  . Multiple Vitamins-Minerals (CENTRUM SILVER ULTRA MENS) TABS Take by mouth.    . Omega-3 Fatty Acids (FISH OIL) 1000 MG CAPS Take 1 capsule by mouth 2 (two) times daily.    . traZODone (DESYREL) 100 MG tablet TAKE ONE TABLET AT BEDTIME  . zolpidem (AMBIEN) 5 MG tablet TAKE 1 TABLET AT BEDTIME AS NEEDED FOR SLEEP   No facility-administered encounter medications  on file as of 11/11/2016.     ALLERGIES:  Allergies  Allergen Reactions  . Penicillins Hives  . Simvastatin     REACTION: myalgia     PHYSICAL EXAMINATION  ECOG PERFORMANCE STATUS: 0 - Asymptomatic  Vitals:   11/11/16 1318  BP: 109/61  Pulse: 66  Resp: 18  Temp: 97.8 F (36.6 C)    Filed Weights   11/11/16 1318  Weight: (!) 301 lb 14.4 oz (136.9 kg)     General: Male in no acute distress.  Accompanied by his wife.  HEENT: Head is normocephalic.  Pupils equal and reactive to light. Conjunctivae clear without exudate.  Sclerae anicteric. (L) helix of ear with very mild area of scaling (sub-cm); appears benign.  Oral mucosa is pink and moist without lesions. Oropharynx is pink and moist without lesions. Lymph: No cervical, supraclavicular, infraclavicular, or axillary, or inguinal lymphadenopathy noted on palpation.   Cardiovascular: Normal rate and rhythm Respiratory: Clear to auscultation bilaterally. Chest expansion symmetric without accessory muscle use. Breathing non-labored.    GU: Deferred.   GI: Soft, non-tender abdomen. Normoactive bowel sounds.  Neuro: No focal deficits. Steady gait.   Psych: Normal mood and affect for situation. Extremities: No edema, cyanosis, or clubbing.   Skin: Warm and dry.    LABORATORY DATA: CBC    Component Value Date/Time   WBC 6.1 10/17/2016 1600   RBC 4.48 10/17/2016 1600   HGB 14.0 10/17/2016 1600   HCT 41.9  10/17/2016 1600   PLT 168 10/17/2016 1600   MCV 93.5 10/17/2016 1600   MCH 31.3 10/17/2016 1600   MCHC 33.4 10/17/2016 1600   RDW 12.6 10/17/2016 1600   LYMPHSABS 1.3 10/17/2016 1600   MONOABS 0.8 10/17/2016 1600   EOSABS 0.3 10/17/2016 1600   BASOSABS 0.0 10/17/2016 1600   Results for BURRELL, HODAPP (MRN [id number])   Ref. Range 10/17/2016 16:00  Sodium Latest Ref Range: 135 - 145 mmol/L 136  Potassium Latest Ref Range: 3.5 - 5.1 mmol/L 3.8  Chloride Latest Ref Range: 101 - 111 mmol/L 101  CO2 Latest Ref Range: 22 - 32 mmol/L 29  Glucose Latest Ref Range: 65 - 99 mg/dL 89  BUN Latest Ref Range: 6 - 20 mg/dL 15  Creatinine Latest Ref Range: 0.61 - 1.24 mg/dL 3.67  Calcium Latest Ref Range: 8.9 - 10.3 mg/dL 9.3  Anion gap Latest Ref Range: 5 - 15  6  Alkaline Phosphatase Latest Ref Range: 38 - 126 U/L 67  Albumin Latest Ref Range: 3.5 - 5.0 g/dL 4.1  AST Latest Ref Range: 15 - 41 U/L 51 (H)  ALT Latest Ref Range: 17 - 63 U/L 57  Total Protein Latest Ref Range: 6.5 - 8.1 g/dL 7.8  Total Bilirubin Latest Ref Range: 0.3 - 1.2 mg/dL 1.0  EGFR (African American) Latest Ref Range: >60 mL/min >60  EGFR (Non-African Amer.) Latest Ref Range: >60 mL/min >60   Results for VASILIOS, OTTAWAY (MRN [id number])   Ref. Range 10/17/2016 16:00  LDH Latest Ref Range: 98 - 192 U/L 153    RADIOGRAPHIC STUDIES:  No results found.   ASSESSMENT AND PLAN:  Matthew Irwin is a pleasant 61 y.o. male with h/o diffuse large B-cell lymphoma at T6 vertebral body, CD20+, diagnosed in 07/2008. Underwent surgical excision, R-CHOP chemotherapy x 6 cycles, with IT methotrexate during cycles #3-6. Also underwent radiation therapy.    DLBCL, CD20+ at T6 vertebral body:  -Clinically without evidence of disease on exam. No lymphadenopathy. No  back pain or current complaints. LDH normal.  Given that he is now 8+ years out from his original diagnosis without evidence of recurrence, this is very favorable.  I think it is  appropriate to have his port-a-cath removed at this time.  We will refer to Dr. Arnoldo Morale for port removal.  He will return to cancer center in 1 year for continued follow-up.  We will plan to follow him for a total of 10 years (through 2019), at which time we can consider "graduating" him from follow-up at the cancer center and release him to the care of his PCP.    Chronic transaminitis:  -Chronic, mild elevation in AST.  ALT remains normal.  No hepatomegaly on exam, jaundice, or abdominal pain.  We will continue to monitor.      Health/Wellness promotion:  -Encouraged patient to maintain follow-up visits with his PCP and specialists for cancer screening for colon cancer, prostate cancer, and skin cancers. Encouraged physical activity and healthy diety.  Recommended continued abstinence from tobacco and to limit alcohol consumption.     Dispo: -Referral to Dr. Arnoldo Morale to have port-a-cath removed.  -Return to cancer center in 1 year with labs.     THERAPY PLAN:  NCCN guidelines recommends the follow surveillance for Stage I-IV NHL for those who attain a complete response to therapy:  A. H&P every 3-6 months for 5 years, then yearly or as clinically indicated.  B. Labs every 3-6 months for 5 years and then annually or as clinically indicated.  C. Repeat CT scans only as clinically indicated.   All questions were answered. The patient knows to call the clinic with any problems, questions or concerns. We can certainly see the patient much sooner if necessary.  Patient and plan discussed with Dr. Ancil Linsey and she is in agreement with the aforementioned.    Mike Craze, NP [place] [phone]

## 2016-11-14 ENCOUNTER — Ambulatory Visit (INDEPENDENT_AMBULATORY_CARE_PROVIDER_SITE_OTHER): Payer: BLUE CROSS/BLUE SHIELD | Admitting: Nurse Practitioner

## 2016-11-14 ENCOUNTER — Encounter: Payer: Self-pay | Admitting: Nurse Practitioner

## 2016-11-14 VITALS — BP 123/81 | HR 69 | Temp 97.0°F | Ht 70.0 in | Wt 302.0 lb

## 2016-11-14 DIAGNOSIS — Z9109 Other allergy status, other than to drugs and biological substances: Secondary | ICD-10-CM | POA: Diagnosis not present

## 2016-11-14 DIAGNOSIS — E785 Hyperlipidemia, unspecified: Secondary | ICD-10-CM

## 2016-11-14 DIAGNOSIS — I10 Essential (primary) hypertension: Secondary | ICD-10-CM

## 2016-11-14 DIAGNOSIS — F5101 Primary insomnia: Secondary | ICD-10-CM | POA: Diagnosis not present

## 2016-11-14 DIAGNOSIS — Z125 Encounter for screening for malignant neoplasm of prostate: Secondary | ICD-10-CM | POA: Diagnosis not present

## 2016-11-14 DIAGNOSIS — I311 Chronic constrictive pericarditis: Secondary | ICD-10-CM | POA: Diagnosis not present

## 2016-11-14 DIAGNOSIS — F411 Generalized anxiety disorder: Secondary | ICD-10-CM

## 2016-11-14 DIAGNOSIS — Z23 Encounter for immunization: Secondary | ICD-10-CM | POA: Diagnosis not present

## 2016-11-14 DIAGNOSIS — I509 Heart failure, unspecified: Secondary | ICD-10-CM

## 2016-11-14 MED ORDER — ZOLPIDEM TARTRATE 5 MG PO TABS: 5.0000 mg | ORAL_TABLET | Freq: Every evening | ORAL | 2 refills | Status: DC | PRN

## 2016-11-14 MED ORDER — CETIRIZINE HCL 10 MG PO TABS: ORAL_TABLET | ORAL | 5 refills | Status: DC

## 2016-11-14 MED ORDER — ATORVASTATIN CALCIUM 40 MG PO TABS: 40.0000 mg | ORAL_TABLET | Freq: Every day | ORAL | 5 refills | Status: DC

## 2016-11-14 MED ORDER — ESCITALOPRAM OXALATE 20 MG PO TABS: 20.0000 mg | ORAL_TABLET | Freq: Every day | ORAL | 5 refills | Status: DC

## 2016-11-14 MED ORDER — CARVEDILOL 3.125 MG PO TABS: 3.1250 mg | ORAL_TABLET | Freq: Two times a day (BID) | ORAL | 5 refills | Status: DC

## 2016-11-14 MED ORDER — TRAZODONE HCL 100 MG PO TABS: 100.0000 mg | ORAL_TABLET | Freq: Every day | ORAL | 5 refills | Status: DC

## 2016-11-14 MED ORDER — LOSARTAN POTASSIUM 25 MG PO TABS: 25.0000 mg | ORAL_TABLET | Freq: Every day | ORAL | 5 refills | Status: DC

## 2016-11-14 MED ORDER — CLONAZEPAM 0.5 MG PO TABS: 0.5000 mg | ORAL_TABLET | Freq: Two times a day (BID) | ORAL | 2 refills | Status: DC

## 2016-11-14 NOTE — Progress Notes (Signed)
Subjective:    Patient ID: Matthew Irwin, male    DOB: 03/16/1956, 60 y.o.   MRN: [id number]   Patient here today for follow up of chronic medical problems. No chnages since last visit. 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  Outpatient Encounter Prescriptions as of 11/14/2016  Medication Sig  . aspirin 81 MG tablet Take 81 mg by mouth daily.    . atorvastatin (LIPITOR) 40 MG tablet Take 1 tablet (40 mg total) by mouth daily at 6 PM.  . carvedilol (COREG) 3.125 MG tablet Take 1 tablet (3.125 mg total) by mouth 2 (two) times daily with a meal.  . cetirizine (ZYRTEC) 10 MG tablet TAKE 1 TABLET DAILY  . clonazePAM (KLONOPIN) 0.5 MG tablet Take 1 tablet (0.5 mg total) by mouth 2 (two) times daily.  . escitalopram (LEXAPRO) 20 MG tablet Take 1 tablet (20 mg total) by mouth daily.  . losartan (COZAAR) 25 MG tablet Take 1 tablet (25 mg total) by mouth daily.  . Multiple Vitamins-Minerals (CENTRUM SILVER ULTRA MENS) TABS Take by mouth.    . Omega-3 Fatty Acids (FISH OIL) 1000 MG CAPS Take 1 capsule by mouth 2 (two) times daily.    . traZODone (DESYREL) 100 MG tablet TAKE ONE TABLET AT BEDTIME  . zolpidem (AMBIEN) 5 MG tablet TAKE 1 TABLET AT BEDTIME AS NEEDED FOR SLEEP   No facility-administered encounter medications on file as of 11/14/2016.     Hypertension  This is a chronic problem. The current episode started more than 1 year ago. The problem is controlled. Associated symptoms include shortness of breath (Reports SOB when walking in incline--reports this has not changed since his heart surgery. ). Pertinent negatives include no chest pain, headaches, neck pain or palpitations. Risk factors for coronary artery disease include dyslipidemia, family history, male gender, obesity and stress. Past treatments include angiotensin blockers. The current treatment provides significant improvement. Compliance problems include diet and exercise.   Hyperlipidemia   This is a chronic problem. The problem is uncontrolled. Recent lipid tests were reviewed and are high. Exacerbating diseases include obesity. He has no history of diabetes or hypothyroidism. Associated symptoms include shortness of breath (Reports SOB when walking in incline--reports this has not changed since his heart surgery. ). Pertinent negatives include no chest pain or myalgias. Current antihyperlipidemic treatment includes statins. The current treatment provides moderate improvement of lipids. Compliance problems include adherence to diet and adherence to exercise.  Risk factors for coronary artery disease include dyslipidemia, hypertension, male sex and obesity.  Depression Lexapro and clonazepam- Working well- No c/o side effects insomnia trazadone helps him sleep at night. 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 His next follow up appointment is in January.  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  HENT: Negative.   Eyes: Negative.  Negative for visual disturbance.  Respiratory: Positive for shortness of breath (Reports SOB when walking in incline--reports this has not changed since his heart surgery. ). Negative for cough, chest tightness and wheezing.   Cardiovascular: Negative for chest pain, palpitations and leg swelling.  Musculoskeletal: Negative for myalgias and neck pain.  Neurological: Negative for headaches.  All other systems reviewed and are negative.      Objective:     Physical Exam  Constitutional: He is oriented to person, place, and time. He appears well-developed and well-nourished.  HENT:  Head: Normocephalic.  Right Ear: External ear normal.  Left Ear: External ear normal.  Nose: Nose normal.   Mouth/Throat: Oropharynx is clear and moist.  Cerumen obstructing half of canal in left ear.   Eyes: Conjunctivae and EOM are normal. Pupils are equal, round, and reactive to light.  Neck: Normal range of motion. Neck supple. No JVD present. No thyromegaly present.  Cardiovascular: Normal rate, regular rhythm, normal heart sounds and intact distal pulses.  Exam reveals no gallop and no friction rub.   No murmur heard. Pulmonary/Chest: Effort normal and breath sounds normal. No respiratory distress. He has no wheezes. He has no rales. He exhibits no tenderness.  Abdominal: Soft. Bowel sounds are normal. He exhibits no mass. There is no tenderness.  Musculoskeletal: Normal range of motion. He exhibits no edema.  Lymphadenopathy:    He has no cervical adenopathy.  Neurological: He is alert and oriented to person, place, and time. No cranial nerve deficit.  Skin: Skin is warm and dry.  1cm dry lesion on bottom of left foot.  Psychiatric: He has a normal mood and affect. His behavior is normal. Judgment and thought content normal.   BP 123/81   Pulse 69   Temp 97 F (36.1 C) (Oral)   Ht 5' 10" (1.778 m)   Wt (!) 302 lb (137 kg)   BMI 43.33 kg/m      Assessment & Plan:  1. Constrictive pericarditis Keep follow up appointmnet with cardiology  2. Congestive heart failure, unspecified congestive heart failure chronicity, unspecified congestive heart failure type (HCC)  3. Essential hypertension Low sodium diet - carvedilol (COREG) 3.125 MG tablet; Take 1 tablet (3.125 mg total) by mouth 2 (two) times daily with a meal.  Dispense: 60 tablet; Refill: 5 - losartan (COZAAR) 25 MG tablet; Take 1 tablet (25 mg total) by mouth daily.  Dispense: 30 tablet; Refill: 5 - CMP14+EGFR  4. GAD (generalized anxiety disorder) Stress management - escitalopram (LEXAPRO) 20 MG tablet; Take 1 tablet (20 mg total) by mouth daily.  Dispense: 30 tablet; Refill: 5 - clonazePAM (KLONOPIN) 0.5 MG tablet; Take  1 tablet (0.5 mg total) by mouth 2 (two) times daily.  Dispense: 60 tablet; Refill: 2  5. Hyperlipidemia with target LDL less than 100 Low fta diet - atorvastatin (LIPITOR) 40 MG tablet; Take 1 tablet (40 mg total) by mouth daily at 6 PM.  Dispense: 30 tablet; Refill: 5 - Lipid panel  6. Primary insomnia Bedtime routine - zolpidem (AMBIEN) 5 MG tablet; Take 1 tablet (5 mg total) by mouth at bedtime as needed. for sleep  Dispense: 30 tablet; Refill: 2 - traZODone (DESYREL) 100 MG tablet; Take 1 tablet (100 mg total) by mouth at bedtime.  Dispense: 30 tablet; Refill: 5  7. Severe obesity (BMI >= 40) (HCC) Discussed diet and exercise for person with BMI >25 Will recheck weight in 3-6 months  8. Environmental allergies - cetirizine (ZYRTEC) 10 MG tablet; TAKE 1 TABLET DAILY  Dispense: 30 tablet; Refill: 5  9. Prostate cancer screening - PSA, total and free    Labs pending Health maintenance reviewed Diet and exercise encouraged Continue all meds Follow up  In 3 months   Mary-Margaret Martin, FNP   

## 2016-11-14 NOTE — Patient Instructions (Signed)

## 2016-11-14 NOTE — Addendum Note (Signed)
Addended by: Rolena Infante on: 11/14/2016 03:25 PM   Modules accepted: Orders

## 2016-11-15 LAB — LIPID PANEL
CHOLESTEROL TOTAL: 123 mg/dL (ref 100–199)
Chol/HDL Ratio: 3.5 ratio units (ref 0.0–5.0)
HDL: 35 mg/dL — AB (ref >39)
LDL CALC: 57 mg/dL (ref 0–99)
Triglycerides: 153 mg/dL — ABNORMAL HIGH (ref 0–149)
VLDL CHOLESTEROL CAL: 31 mg/dL (ref 5–40)

## 2016-11-15 LAB — CMP14+EGFR
ALBUMIN: 4.1 g/dL (ref 3.5–5.5)
ALT: 50 IU/L — AB (ref 0–44)
AST: 42 IU/L — ABNORMAL HIGH (ref 0–40)
Albumin/Globulin Ratio: 1.3 (ref 1.2–2.2)
Alkaline Phosphatase: 79 IU/L (ref 39–117)
BILIRUBIN TOTAL: 0.5 mg/dL (ref 0.0–1.2)
BUN / CREAT RATIO: 13 (ref 9–20)
BUN: 13 mg/dL (ref 6–24)
CHLORIDE: 100 mmol/L (ref 96–106)
CO2: 28 mmol/L (ref 18–29)
CREATININE: 1.01 mg/dL (ref 0.76–1.27)
Calcium: 9.3 mg/dL (ref 8.7–10.2)
GFR calc non Af Amer: 81 mL/min/{1.73_m2} (ref >59)
GFR, EST AFRICAN AMERICAN: 94 mL/min/{1.73_m2} (ref >59)
GLUCOSE: 83 mg/dL (ref 65–99)
Globulin, Total: 3.1 g/dL (ref 1.5–4.5)
Potassium: 4.3 mmol/L (ref 3.5–5.2)
Sodium: 142 mmol/L (ref 134–144)
TOTAL PROTEIN: 7.2 g/dL (ref 6.0–8.5)

## 2016-11-15 LAB — PSA, TOTAL AND FREE
PROSTATE SPECIFIC AG, SERUM: 0.3 ng/mL (ref 0.0–4.0)
PSA FREE: 0.09 ng/mL
PSA, Free Pct: 30 %

## 2016-12-11 NOTE — H&P (Signed)
  NTS SOAP Note  Vital Signs:  Vitals as of: 123XX123: Systolic 0000000: Diastolic 78: Heart Rate 67: Temp 97.60F (Temporal): Height 66ft 10in: Weight 302Lbs 0 Ounces: BMI 43.33   BMI : 43.33 kg/m2  Subjective: This 61 year old male presents for of need for portacath removal.  Referred by Oncology.  Review of Symptoms:  Constitutional:negative Head:negative Eyes:negative Nose/Mouth/Throat:negative Cardiovascular:negative Respiratory:negative Gastrointestinnegative Genitourinary:negative Musculoskeletal:negative Skin:negative Hematolgic/Lymphatic:negative Allergic/Immunologic:negative   Past Medical History:Reviewed  Past Medical History  Surgical History: back, heart surgery, portacath placement 2009 Medical Problems: high cholesterol, h/o non Hodgkin's lymphoma, HTN Allergies: pcn, statins Medications: lipitor, coreg, zyrtec, cozaar, desyrel, ambien   Social History:Reviewed  Social History  Preferred Language: English Race:  White Ethnicity: Not Hispanic / Latino Age: 3 year Marital Status:  M Alcohol: no   Smoking Status: Never smoker reviewed on 12/10/2016 Functional Status reviewed on 12/10/2016 ------------------------------------------------ Bathing: Normal Cooking: Normal Dressing: Normal Driving: Normal Eating: Normal Managing Meds: Normal Oral Care: Normal Shopping: Normal Toileting: Normal Transferring: Normal Walking: Normal Cognitive Status reviewed on 12/10/2016 ------------------------------------------------ Attention: Normal Decision Making: Normal Language: Normal Memory: Normal Motor: Normal Perception: Normal Problem Solving: Normal Visual and Spatial: Normal   Family History:Reviewed  Family Health History Mother, Living; Parkinson's disease;  Father     Objective Information: General:Well appearing, well nourished in no distress. Skin:no rash or prominent lesionsPortacath in place left upper  chewt Head:Atraumatic; no masses; no abnormalities Neck:Supple without lymphadenopathy.  Heart:RRR, no murmur Lungs:CTA bilaterally, no wheezes, rhonchi, rales.  Breathing unlabored. Oncology notes reviewed Assessment:h/o Nonhodgkin's lymphoma  Diagnoses: 200.00  C83.30 Diffuse non-Hodgkin's lymphoma, large cell (clinical)(Diffuse large B-cell lymphoma, unspecified site)  Procedures: [id number] - OFFICE OUTPATIENT NEW 20 MINUTES    Plan:  Scheduled for portacath removal on 12/13/16.   Patient Education:Alternative treatments to surgery were discussed with patient (and family).Risks and benefits  of procedure were fully explained to the patient (and family) who gave informed consent. Patient/family questions were addressed.  Follow-up:Pending Surgery

## 2016-12-12 ENCOUNTER — Encounter (HOSPITAL_COMMUNITY): Payer: BLUE CROSS/BLUE SHIELD

## 2016-12-13 ENCOUNTER — Ambulatory Visit (HOSPITAL_COMMUNITY)
Admission: RE | Admit: 2016-12-13 | Discharge: 2016-12-13 | Disposition: A | Discharge status: Home / Self Care | Payer: BLUE CROSS/BLUE SHIELD | Source: Ambulatory Visit | Attending: General Surgery | Admitting: General Surgery

## 2016-12-13 ENCOUNTER — Encounter (HOSPITAL_COMMUNITY)
Admission: RE | Disposition: A | Discharge status: Home / Self Care | Payer: Self-pay | Source: Ambulatory Visit | Attending: General Surgery

## 2016-12-13 DIAGNOSIS — I10 Essential (primary) hypertension: Secondary | ICD-10-CM | POA: Insufficient documentation

## 2016-12-13 DIAGNOSIS — E7800 Pure hypercholesterolemia, unspecified: Secondary | ICD-10-CM | POA: Insufficient documentation

## 2016-12-13 DIAGNOSIS — Z79899 Other long term (current) drug therapy: Secondary | ICD-10-CM | POA: Insufficient documentation

## 2016-12-13 DIAGNOSIS — Z452 Encounter for adjustment and management of vascular access device: Secondary | ICD-10-CM | POA: Insufficient documentation

## 2016-12-13 HISTORY — PX: PORT-A-CATH REMOVAL: SHX5289

## 2016-12-13 SURGERY — MINOR REMOVAL PORT-A-CATH
Anesthesia: LOCAL | Laterality: Left

## 2016-12-13 MED ORDER — LIDOCAINE HCL (PF) 1 % IJ SOLN
INTRAMUSCULAR | Status: AC
  Filled 2016-12-13: qty 30

## 2016-12-13 MED ORDER — LIDOCAINE HCL (PF) 1 % IJ SOLN
INTRAMUSCULAR | Status: DC | PRN
  Administered 2016-12-13: 6 mL via INTRADERMAL

## 2016-12-13 SURGICAL SUPPLY — 19 items
CLOTH BEACON ORANGE TIMEOUT ST (SAFETY) ×2 IMPLANT
DECANTER SPIKE VIAL GLASS SM (MISCELLANEOUS) ×2 IMPLANT
DRAPE PROXIMA HALF (DRAPES) ×2 IMPLANT
DURAPREP 6ML APPLICATOR 50/CS (WOUND CARE) ×2 IMPLANT
ELECT REM PT RETURN 9FT ADLT (ELECTROSURGICAL) ×2
ELECTRODE REM PT RTRN 9FT ADLT (ELECTROSURGICAL) ×1 IMPLANT
GLOVE BIOGEL PI IND STRL 7.0 (GLOVE) ×1 IMPLANT
GLOVE BIOGEL PI INDICATOR 7.0 (GLOVE) ×1
GLOVE SURG SS PI 7.5 STRL IVOR (GLOVE) ×2 IMPLANT
GOWN STRL REUS W/TWL LRG LVL3 (GOWN DISPOSABLE) ×2 IMPLANT
LIQUID BAND (GAUZE/BANDAGES/DRESSINGS) ×2 IMPLANT
NEEDLE HYPO 25X1 1.5 SAFETY (NEEDLE) ×2 IMPLANT
PENCIL HANDSWITCHING (ELECTRODE) ×2 IMPLANT
SPONGE GAUZE 2X2 8PLY STRL LF (GAUZE/BANDAGES/DRESSINGS) ×2 IMPLANT
SUT VIC AB 3-0 SH 27 (SUTURE) ×1
SUT VIC AB 3-0 SH 27X BRD (SUTURE) ×1 IMPLANT
SUT VIC AB 4-0 PS2 27 (SUTURE) ×2 IMPLANT
SYR CONTROL 10ML LL (SYRINGE) ×2 IMPLANT
TOWEL OR 17X26 4PK STRL BLUE (TOWEL DISPOSABLE) ×2 IMPLANT

## 2016-12-13 NOTE — Interval H&P Note (Signed)
History and Physical Interval Note:  12/13/2016 10:23 AM  Matthew Irwin  has presented today for surgery, with the diagnosis of B cell lymphoma  The various methods of treatment have been discussed with the patient and family. After consideration of risks, benefits and other options for treatment, the patient has consented to  Procedure(s): MINOR REMOVAL PORT-A-CATH (Left) as a surgical intervention .  The patient's history has been reviewed, patient examined, no change in status, stable for surgery.  I have reviewed the patient's chart and labs.  Questions were answered to the patient's satisfaction.     Aviva Signs A

## 2016-12-13 NOTE — Discharge Instructions (Signed)
Implanted Port Removal, Care After Introduction Refer to this sheet in the next few weeks. These instructions provide you with information about caring for yourself after your procedure. Your health care provider may also give you more specific instructions. Your treatment has been planned according to current medical practices, but problems sometimes occur. Call your health care provider if you have any problems or questions after your procedure. What can I expect after the procedure? After the procedure, it is common to have:  Soreness or pain near your incision.  Some swelling or bruising near your incision. Follow these instructions at home: Medicines  Take over-the-counter and prescription medicines only as told by your health care provider.  If you were prescribed an antibiotic medicine, take it as told by your health care provider. Do not stop taking the antibiotic even if you start to feel better. Bathing  Do not take baths, swim, or use a hot tub until your health care provider approves. Ask your health care provider if you can take showers. You may only be allowed to take sponge baths for bathing. Incision care  Follow instructions from your health care provider about how to take care of your incision. Make sure you:  Wash your hands with soap and water before you change your bandage (dressing). If soap and water are not available, use hand sanitizer.  Change your dressing as told by your health care provider.  Keep your dressing dry.  Leave stitches (sutures), skin glue, or adhesive strips in place. These skin closures may need to stay in place for 2 weeks or longer. If adhesive strip edges start to loosen and curl up, you may trim the loose edges. Do not remove adhesive strips completely unless your health care provider tells you to do that.  Check your incision area every day for signs of infection. Check for:  More redness, swelling, or pain.  More fluid or  blood.  Warmth.  Pus or a bad smell. Driving  If you received a sedative, do not drive for 24 hours after the procedure.  If you did not receive a sedative, ask your health care provider when it is safe to drive. Activity  Return to your normal activities as told by your health care provider. Ask your health care provider what activities are safe for you.  Until your health care provider says it is safe:  Do not lift anything that is heavier than 10 lb (4.5 kg).  Do not do activities that involve lifting your arms over your head. General instructions  Do not use any tobacco products, such as cigarettes, chewing tobacco, and e-cigarettes. Tobacco can delay healing. If you need help quitting, ask your health care provider.  Keep all follow-up visits as told by your health care provider. This is important. Contact a health care provider if:  You have more redness, swelling, or pain around your incision.  You have more fluid or blood coming from your incision.  Your incision feels warm to the touch.  You have pus or a bad smell coming from your incision.  You have a fever.  You have pain that is not relieved by your pain medicine. Get help right away if:  You have chest pain.  You have difficulty breathing. This information is not intended to replace advice given to you by your health care provider. Make sure you discuss any questions you have with your health care provider. Document Released: 09/18/2015 Document Revised: 03/14/2016 Document Reviewed: 07/12/2015  2017 Elsevier

## 2016-12-13 NOTE — Op Note (Signed)
Patient:  Matthew Irwin  DOB:  11/11/1955  MRN:  [id number]   Preop Diagnosis:  Non-Hodgkin's lymphoma, finished with chemotherapy  Postop Diagnosis:  Same  Procedure:  Port-A-Cath removal  Surgeon:  Aviva Signs, M.D.  Anes:  Local  Indications:  Patient is a 61 year old white male who has finished his chemotherapy and presents for Port-A-Cath removal. The risks and benefits of the procedure were fully explained to the patient, who gave informed consent.  Procedure note:  The patient was placed in the supine position. The left upper chest was prepped and draped using the usual sterile technique with DuraPrep. Surgical site confirmation was performed. One percent Xylocaine was used for local anesthesia.  An incision was made through the previous surgical incision site. The dissection was taken down to the port. The Port-A-Cath was removed in total without difficulty. It was disposed of. The subcutaneous layer was reapproximated using a 3-0 Vicryl interrupted suture. The skin was closed using a 4-0 Vicryl subcuticular suture. Dermabond was applied.  All tape and needle counts were correct at the end of the procedure. The patient was discharged in stable condition.  Complications:  None  EBL:  Minimal  Specimen:  None

## 2016-12-16 ENCOUNTER — Encounter (HOSPITAL_COMMUNITY): Payer: Self-pay | Admitting: General Surgery

## 2017-02-14 ENCOUNTER — Ambulatory Visit (INDEPENDENT_AMBULATORY_CARE_PROVIDER_SITE_OTHER): Payer: No Typology Code available for payment source | Admitting: Nurse Practitioner

## 2017-02-14 ENCOUNTER — Encounter: Payer: Self-pay | Admitting: Nurse Practitioner

## 2017-02-14 VITALS — BP 110/73 | HR 64 | Temp 97.4°F | Ht 70.0 in | Wt 304.0 lb

## 2017-02-14 DIAGNOSIS — Z9109 Other allergy status, other than to drugs and biological substances: Secondary | ICD-10-CM | POA: Diagnosis not present

## 2017-02-14 DIAGNOSIS — I10 Essential (primary) hypertension: Secondary | ICD-10-CM

## 2017-02-14 DIAGNOSIS — F5101 Primary insomnia: Secondary | ICD-10-CM | POA: Diagnosis not present

## 2017-02-14 DIAGNOSIS — E785 Hyperlipidemia, unspecified: Secondary | ICD-10-CM

## 2017-02-14 DIAGNOSIS — C8339 Diffuse large B-cell lymphoma, extranodal and solid organ sites: Secondary | ICD-10-CM | POA: Diagnosis not present

## 2017-02-14 DIAGNOSIS — F411 Generalized anxiety disorder: Secondary | ICD-10-CM | POA: Diagnosis not present

## 2017-02-14 DIAGNOSIS — I509 Heart failure, unspecified: Secondary | ICD-10-CM

## 2017-02-14 MED ORDER — CLONAZEPAM 0.5 MG PO TABS: 0.5000 mg | ORAL_TABLET | Freq: Two times a day (BID) | ORAL | 3 refills | Status: DC

## 2017-02-14 MED ORDER — ESCITALOPRAM OXALATE 20 MG PO TABS: 20.0000 mg | ORAL_TABLET | Freq: Every day | ORAL | 5 refills | Status: DC

## 2017-02-14 MED ORDER — LOSARTAN POTASSIUM 25 MG PO TABS: 25.0000 mg | ORAL_TABLET | Freq: Every day | ORAL | 5 refills | Status: DC

## 2017-02-14 MED ORDER — ZOLPIDEM TARTRATE 5 MG PO TABS: 5.0000 mg | ORAL_TABLET | Freq: Every evening | ORAL | 3 refills | Status: DC | PRN

## 2017-02-14 MED ORDER — TRAZODONE HCL 100 MG PO TABS: 100.0000 mg | ORAL_TABLET | Freq: Every day | ORAL | 5 refills | Status: DC

## 2017-02-14 MED ORDER — CETIRIZINE HCL 10 MG PO TABS: ORAL_TABLET | ORAL | 5 refills | Status: DC

## 2017-02-14 MED ORDER — CARVEDILOL 3.125 MG PO TABS: 3.1250 mg | ORAL_TABLET | Freq: Two times a day (BID) | ORAL | 5 refills | Status: DC

## 2017-02-14 MED ORDER — ATORVASTATIN CALCIUM 40 MG PO TABS: 40.0000 mg | ORAL_TABLET | Freq: Every day | ORAL | 5 refills | Status: DC

## 2017-02-14 NOTE — Patient Instructions (Signed)
Allergic Rhinitis Allergic rhinitis is when the mucous membranes in the nose respond to allergens. Allergens are particles in the air that cause your body to have an allergic reaction. This causes you to release allergic antibodies. Through a chain of events, these eventually cause you to release histamine into the blood stream. Although meant to protect the body, it is this release of histamine that causes your discomfort, such as frequent sneezing, congestion, and an itchy, runny nose. What are the causes? Seasonal allergic rhinitis (hay fever) is caused by pollen allergens that may come from grasses, trees, and weeds. Year-round allergic rhinitis (perennial allergic rhinitis) is caused by allergens such as house dust mites, pet dander, and mold spores. What are the signs or symptoms?  Nasal stuffiness (congestion).  Itchy, runny nose with sneezing and tearing of the eyes. How is this diagnosed? Your health care provider can help you determine the allergen or allergens that trigger your symptoms. If you and your health care provider are unable to determine the allergen, skin or blood testing may be used. Your health care provider will diagnose your condition after taking your health history and performing a physical exam. Your health care provider may assess you for other related conditions, such as asthma, pink eye, or an ear infection. How is this treated? Allergic rhinitis does not have a cure, but it can be controlled by:  Medicines that block allergy symptoms. These may include allergy shots, nasal sprays, and oral antihistamines.  Avoiding the allergen. Hay fever may often be treated with antihistamines in pill or nasal spray forms. Antihistamines block the effects of histamine. There are over-the-counter medicines that may help with nasal congestion and swelling around the eyes. Check with your health care provider before taking or giving this medicine. If avoiding the allergen or the  medicine prescribed do not work, there are many new medicines your health care provider can prescribe. Stronger medicine may be used if initial measures are ineffective. Desensitizing injections can be used if medicine and avoidance does not work. Desensitization is when a patient is given ongoing shots until the body becomes less sensitive to the allergen. Make sure you follow up with your health care provider if problems continue. Follow these instructions at home: It is not possible to completely avoid allergens, but you can reduce your symptoms by taking steps to limit your exposure to them. It helps to know exactly what you are allergic to so that you can avoid your specific triggers. Contact a health care provider if:  You have a fever.  You develop a cough that does not stop easily (persistent).  You have shortness of breath.  You start wheezing.  Symptoms interfere with normal daily activities. This information is not intended to replace advice given to you by your health care provider. Make sure you discuss any questions you have with your health care provider. Document Released: 07/02/2001 Document Revised: 06/07/2016 Document Reviewed: 06/14/2013 Elsevier Interactive Patient Education  2017 Elsevier Inc.  

## 2017-02-14 NOTE — Progress Notes (Signed)
Subjective:    Patient ID: Matthew Irwin, male    DOB: Oct 29, 1955, 61 y.o.   MRN: [id number]  HPI  Matthew Irwin is here today for follow up of chronic medical problem.  Outpatient Encounter Prescriptions as of 02/14/2017  Medication Sig  . aspirin 81 MG tablet Take 81 mg by mouth at bedtime.   Marland Kitchen atorvastatin (LIPITOR) 40 MG tablet Take 1 tablet (40 mg total) by mouth daily at 6 PM.  . carvedilol (COREG) 3.125 MG tablet Take 1 tablet (3.125 mg total) by mouth 2 (two) times daily with a meal.  . cetirizine (ZYRTEC) 10 MG tablet TAKE 1 TABLET DAILY  . clonazePAM (KLONOPIN) 0.5 MG tablet Take 1 tablet (0.5 mg total) by mouth 2 (two) times daily. (Patient taking differently: Take 0.5 mg by mouth daily. May take an additional 0.5 mgs as needed for anxiety)  . escitalopram (LEXAPRO) 20 MG tablet Take 1 tablet (20 mg total) by mouth daily.  Marland Kitchen ibuprofen (ADVIL,MOTRIN) 200 MG tablet Take 400-600 mg by mouth every 6 (six) hours as needed for headache or moderate pain.  Marland Kitchen ketotifen (ALAWAY) 0.025 % ophthalmic solution Place 2 drops into both eyes 2 (two) times daily.  Marland Kitchen losartan (COZAAR) 25 MG tablet Take 1 tablet (25 mg total) by mouth daily.  . Multiple Vitamins-Minerals (CENTRUM SILVER ULTRA MENS) TABS Take 1 tablet by mouth daily.   . Omega-3 Fatty Acids (FISH OIL) 1000 MG CAPS Take 1,000 mg by mouth 2 (two) times daily.   Marland Kitchen Propylene Glycol-Glycerin (SOOTHE) 0.6-0.6 % SOLN Apply 1 drop to eye daily as needed (dry eyes).  . traZODone (DESYREL) 100 MG tablet Take 1 tablet (100 mg total) by mouth at bedtime.  Marland Kitchen zolpidem (AMBIEN) 5 MG tablet Take 1 tablet (5 mg total) by mouth at bedtime as needed. for sleep   No facility-administered encounter medications on file as of 02/14/2017.     1. Chronic congestive heart failure, unspecified heart failure type [facility])   has not sen cardiologist in the last 79month- denies any chest pain and SOB  2. Essential hypertension  No HA- does not check blood pressure  at home  3. GAD (generalized anxiety disorder)  Takes lexapro and klonopin as needed- helps him to not worry so much and stay on edge- he denies nay side efects  4. Hyperlipidemia with target LDL less than 100  Does not watch diet  5. Primary insomnia  Takes ambien and trazadone at night to sleep- combination works ell for him  6. Severe obesity (BMI >= 40) (HCC)    No recent weight gain or weight loss  7. 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  Eyes: Negative for pain.  Respiratory: Negative for shortness of breath.   Cardiovascular: Negative for chest pain, palpitations and leg swelling.  Gastrointestinal: Negative for abdominal pain.  Endocrine: Negative for polydipsia.  Skin: Negative for rash.  Neurological: Negative for dizziness, weakness and headaches.  Hematological: Does not bruise/bleed easily.       Objective:   Physical Exam  Constitutional: He is oriented to person, place, and time. He appears well-developed and well-nourished.  HENT:  Head: Normocephalic.  Right Ear: External ear normal.  Left Ear: External ear normal.  Nose: Nose normal.  Mouth/Throat:  Oropharynx is clear and moist.  Eyes: EOM are normal. Pupils are equal, round, and reactive to light.  Neck: Normal range of motion. Neck supple. No JVD present. No thyromegaly present.  Cardiovascular: Normal rate, regular rhythm, normal heart sounds and intact distal pulses.  Exam reveals no gallop and no friction rub.   No murmur heard. Pulmonary/Chest: Effort normal and breath sounds normal. No respiratory distress. He has no wheezes. He has no rales. He exhibits no tenderness.  Abdominal: Soft. Bowel sounds are normal. He exhibits no mass. There is no tenderness.  Genitourinary: Prostate normal  and penis normal.  Musculoskeletal: Normal range of motion. He exhibits no edema.  Lymphadenopathy:    He has no cervical adenopathy.  Neurological: He is alert and oriented to person, place, and time. No cranial nerve deficit.  Skin: Skin is warm and dry.  Psychiatric: He has a normal mood and affect. His behavior is normal. Judgment and thought content normal.    BP 110/73   Pulse 64   Temp 97.4 F (36.3 C) (Oral)   Ht 5' 10"  (1.778 m)   Wt (!) 304 lb (137.9 kg)   BMI 43.62 kg/m       Assessment & Plan:  1. Chronic congestive heart failure, unspecified heart failure type [facility]) Keep follow up with cardiology  2. Essential hypertension Low sodium diet - CMP14+EGFR - carvedilol (COREG) 3.125 MG tablet; Take 1 tablet (3.125 mg total) by mouth 2 (two) times daily with a meal.  Dispense: 60 tablet; Refill: 5 - losartan (COZAAR) 25 MG tablet; Take 1 tablet (25 mg total) by mouth daily.  Dispense: 30 tablet; Refill: 5  3. GAD (generalized anxiety disorder) Stress management - escitalopram (LEXAPRO) 20 MG tablet; Take 1 tablet (20 mg total) by mouth daily.  Dispense: 30 tablet; Refill: 5 - clonazePAM (KLONOPIN) 0.5 MG tablet; Take 1 tablet (0.5 mg total) by mouth 2 (two) times daily.  Dispense: 60 tablet; Refill: 3  4. Hyperlipidemia with target LDL less than 100 Low fat diet encouraged - Lipid panel - atorvastatin (LIPITOR) 40 MG tablet; Take 1 tablet (40 mg total) by mouth daily at 6 PM.  Dispense: 30 tablet; Refill: 5  5. Primary insomnia Bedtime routine - zolpidem (AMBIEN) 5 MG tablet; Take 1 tablet (5 mg total) by mouth at bedtime as needed. for sleep  Dispense: 30 tablet; Refill: 3 - traZODone (DESYREL) 100 MG tablet; Take 1 tablet (100 mg total) by mouth at bedtime.  Dispense: 30 tablet; Refill: 5  6. Severe obesity (BMI >= 40) (HCC) Discussed diet and exercise for person with BMI >25 Will recheck weight in 3-6 months  7. Diffuse large B-cell lymphoma of solid organ  excluding spleen ([place]) Continue to keep follow up with cardiology  8. Environmental allergies Wear mask when outside for extended periods of time - cetirizine (ZYRTEC) 10 MG tablet; TAKE 1 TABLET DAILY  Dispense: 30 tablet; Refill: 5    Labs pending Health maintenance reviewed Diet and exercise encouraged Continue all meds Follow up  In 6 months   [place], FNP

## 2017-02-15 LAB — CMP14+EGFR
ALK PHOS: 77 IU/L (ref 39–117)
ALT: 61 IU/L — AB (ref 0–44)
AST: 50 IU/L — AB (ref 0–40)
Albumin/Globulin Ratio: 1.5 (ref 1.2–2.2)
Albumin: 4.1 g/dL (ref 3.6–4.8)
BUN/Creatinine Ratio: 18 (ref 10–24)
BUN: 16 mg/dL (ref 8–27)
Bilirubin Total: 0.7 mg/dL (ref 0.0–1.2)
CALCIUM: 9.3 mg/dL (ref 8.6–10.2)
CO2: 28 mmol/L (ref 18–29)
CREATININE: 0.9 mg/dL (ref 0.76–1.27)
Chloride: 99 mmol/L (ref 96–106)
GFR calc Af Amer: 107 mL/min/{1.73_m2} (ref >59)
GFR, EST NON AFRICAN AMERICAN: 93 mL/min/{1.73_m2} (ref >59)
GLUCOSE: 100 mg/dL — AB (ref 65–99)
Globulin, Total: 2.8 g/dL (ref 1.5–4.5)
Potassium: 4.3 mmol/L (ref 3.5–5.2)
Sodium: 139 mmol/L (ref 134–144)
Total Protein: 6.9 g/dL (ref 6.0–8.5)

## 2017-02-15 LAB — LIPID PANEL
CHOL/HDL RATIO: 4.7 ratio (ref 0.0–5.0)
CHOLESTEROL TOTAL: 146 mg/dL (ref 100–199)
HDL: 31 mg/dL — AB (ref >39)
LDL CALC: 62 mg/dL (ref 0–99)
TRIGLYCERIDES: 267 mg/dL — AB (ref 0–149)
VLDL Cholesterol Cal: 53 mg/dL — ABNORMAL HIGH (ref 5–40)

## 2017-05-19 ENCOUNTER — Ambulatory Visit (INDEPENDENT_AMBULATORY_CARE_PROVIDER_SITE_OTHER): Payer: No Typology Code available for payment source | Admitting: Nurse Practitioner

## 2017-05-19 ENCOUNTER — Encounter: Payer: Self-pay | Admitting: Nurse Practitioner

## 2017-05-19 VITALS — BP 113/80 | HR 80 | Temp 97.1°F | Ht 70.0 in | Wt 305.0 lb

## 2017-05-19 DIAGNOSIS — E785 Hyperlipidemia, unspecified: Secondary | ICD-10-CM | POA: Diagnosis not present

## 2017-05-19 DIAGNOSIS — R739 Hyperglycemia, unspecified: Secondary | ICD-10-CM

## 2017-05-19 DIAGNOSIS — C8339 Diffuse large B-cell lymphoma, extranodal and solid organ sites: Secondary | ICD-10-CM

## 2017-05-19 DIAGNOSIS — I10 Essential (primary) hypertension: Secondary | ICD-10-CM | POA: Diagnosis not present

## 2017-05-19 DIAGNOSIS — Z9109 Other allergy status, other than to drugs and biological substances: Secondary | ICD-10-CM | POA: Diagnosis not present

## 2017-05-19 DIAGNOSIS — F411 Generalized anxiety disorder: Secondary | ICD-10-CM | POA: Diagnosis not present

## 2017-05-19 DIAGNOSIS — I509 Heart failure, unspecified: Secondary | ICD-10-CM | POA: Diagnosis not present

## 2017-05-19 DIAGNOSIS — F5101 Primary insomnia: Secondary | ICD-10-CM | POA: Diagnosis not present

## 2017-05-19 DIAGNOSIS — C83398 Diffuse large b-cell lymphoma of other extranodal and solid organ sites: Secondary | ICD-10-CM

## 2017-05-19 LAB — CMP14+EGFR
A/G RATIO: 1.4 (ref 1.2–2.2)
ALBUMIN: 4.2 g/dL (ref 3.6–4.8)
ALT: 54 IU/L — AB (ref 0–44)
AST: 40 IU/L (ref 0–40)
Alkaline Phosphatase: 74 IU/L (ref 39–117)
BUN / CREAT RATIO: 14 (ref 10–24)
BUN: 13 mg/dL (ref 8–27)
Bilirubin Total: 0.6 mg/dL (ref 0.0–1.2)
CHLORIDE: 101 mmol/L (ref 96–106)
CO2: 26 mmol/L (ref 20–29)
Calcium: 9.1 mg/dL (ref 8.6–10.2)
Creatinine, Ser: 0.9 mg/dL (ref 0.76–1.27)
GFR, EST AFRICAN AMERICAN: 107 mL/min/{1.73_m2} (ref >59)
GFR, EST NON AFRICAN AMERICAN: 93 mL/min/{1.73_m2} (ref >59)
GLOBULIN, TOTAL: 3 g/dL (ref 1.5–4.5)
Glucose: 113 mg/dL — ABNORMAL HIGH (ref 65–99)
POTASSIUM: 4.6 mmol/L (ref 3.5–5.2)
SODIUM: 140 mmol/L (ref 134–144)
TOTAL PROTEIN: 7.2 g/dL (ref 6.0–8.5)

## 2017-05-19 LAB — LIPID PANEL
CHOL/HDL RATIO: 3.7 ratio (ref 0.0–5.0)
Cholesterol, Total: 144 mg/dL (ref 100–199)
HDL: 39 mg/dL — AB (ref >39)
LDL Calculated: 67 mg/dL (ref 0–99)
Triglycerides: 188 mg/dL — ABNORMAL HIGH (ref 0–149)
VLDL Cholesterol Cal: 38 mg/dL (ref 5–40)

## 2017-05-19 MED ORDER — CLONAZEPAM 0.5 MG PO TABS: 0.5000 mg | ORAL_TABLET | Freq: Two times a day (BID) | ORAL | 3 refills | Status: DC

## 2017-05-19 MED ORDER — CARVEDILOL 3.125 MG PO TABS: 3.1250 mg | ORAL_TABLET | Freq: Two times a day (BID) | ORAL | 5 refills | Status: DC

## 2017-05-19 MED ORDER — CETIRIZINE HCL 10 MG PO TABS: ORAL_TABLET | ORAL | 5 refills | Status: DC

## 2017-05-19 MED ORDER — ZOLPIDEM TARTRATE 5 MG PO TABS: 5.0000 mg | ORAL_TABLET | Freq: Every evening | ORAL | 3 refills | Status: DC | PRN

## 2017-05-19 MED ORDER — TRAZODONE HCL 100 MG PO TABS: 100.0000 mg | ORAL_TABLET | Freq: Every day | ORAL | 5 refills | Status: DC

## 2017-05-19 MED ORDER — ESCITALOPRAM OXALATE 20 MG PO TABS: 20.0000 mg | ORAL_TABLET | Freq: Every day | ORAL | 5 refills | Status: DC

## 2017-05-19 MED ORDER — LOSARTAN POTASSIUM 25 MG PO TABS: 25.0000 mg | ORAL_TABLET | Freq: Every day | ORAL | 5 refills | Status: DC

## 2017-05-19 MED ORDER — ATORVASTATIN CALCIUM 40 MG PO TABS: 40.0000 mg | ORAL_TABLET | Freq: Every day | ORAL | 5 refills | Status: DC

## 2017-05-19 NOTE — Patient Instructions (Signed)
Exercising to Lose Weight Exercising can help you to lose weight. In order to lose weight through exercise, you need to do vigorous-intensity exercise. You can tell that you are exercising with vigorous intensity if you are breathing very hard and fast and cannot hold a conversation while exercising. Moderate-intensity exercise helps to maintain your current weight. You can tell that you are exercising at a moderate level if you have a higher heart rate and faster breathing, but you are still able to hold a conversation. How often should I exercise? Choose an activity that you enjoy and set realistic goals. Your health care provider can help you to make an activity plan that works for you. Exercise regularly as directed by your health care provider. This may include:  Doing resistance training twice each week, such as: ? Push-ups. ? Sit-ups. ? Lifting weights. ? Using resistance bands.  Doing a given intensity of exercise for a given amount of time. Choose from these options: ? 150 minutes of moderate-intensity exercise every week. ? 75 minutes of vigorous-intensity exercise every week. ? A mix of moderate-intensity and vigorous-intensity exercise every week.  Children, pregnant women, people who are out of shape, people who are overweight, and older adults may need to consult a health care provider for individual recommendations. If you have any sort of medical condition, be sure to consult your health care provider before starting a new exercise program. What are some activities that can help me to lose weight?  Walking at a rate of at least 4.5 miles an hour.  Jogging or running at a rate of 5 miles per hour.  Biking at a rate of at least 10 miles per hour.  Lap swimming.  Roller-skating or in-line skating.  Cross-country skiing.  Vigorous competitive sports, such as football, basketball, and soccer.  Jumping rope.  Aerobic dancing. How can I be more active in my day-to-day  activities?  Use the stairs instead of the elevator.  Take a walk during your lunch break.  If you drive, park your car farther away from work or school.  If you take public transportation, get off one stop early and walk the rest of the way.  Make all of your phone calls while standing up and walking around.  Get up, stretch, and walk around every 30 minutes throughout the day. What guidelines should I follow while exercising?  Do not exercise so much that you hurt yourself, feel dizzy, or get very short of breath.  Consult your health care provider prior to starting a new exercise program.  Wear comfortable clothes and shoes with good support.  Drink plenty of water while you exercise to prevent dehydration or heat stroke. Body water is lost during exercise and must be replaced.  Work out until you breathe faster and your heart beats faster. This information is not intended to replace advice given to you by your health care provider. Make sure you discuss any questions you have with your health care provider. Document Released: 11/09/2010 Document Revised: 03/14/2016 Document Reviewed: 03/10/2014 Elsevier Interactive Patient Education  2018 Elsevier Inc.  

## 2017-05-19 NOTE — Progress Notes (Signed)
Subjective:    Patient ID: Matthew Irwin, male    DOB: 12-01-1955, 61 y.o.   MRN: [id number]  HPI ALONDRA SAHNI is here today for follow up of chronic medical problem.  Outpatient Encounter Prescriptions as of 05/19/2017  Medication Sig  . aspirin 81 MG tablet Take 81 mg by mouth at bedtime.   Marland Kitchen atorvastatin (LIPITOR) 40 MG tablet Take 1 tablet (40 mg total) by mouth daily at 6 PM.  . carvedilol (COREG) 3.125 MG tablet Take 1 tablet (3.125 mg total) by mouth 2 (two) times daily with a meal.  . cetirizine (ZYRTEC) 10 MG tablet TAKE 1 TABLET DAILY  . clonazePAM (KLONOPIN) 0.5 MG tablet Take 1 tablet (0.5 mg total) by mouth 2 (two) times daily.  Marland Kitchen escitalopram (LEXAPRO) 20 MG tablet Take 1 tablet (20 mg total) by mouth daily.  Marland Kitchen ibuprofen (ADVIL,MOTRIN) 200 MG tablet Take 400-600 mg by mouth every 6 (six) hours as needed for headache or moderate pain.  Marland Kitchen ketotifen (ALAWAY) 0.025 % ophthalmic solution Place 2 drops into both eyes 2 (two) times daily.  Marland Kitchen losartan (COZAAR) 25 MG tablet Take 1 tablet (25 mg total) by mouth daily.  . Multiple Vitamins-Minerals (CENTRUM SILVER ULTRA MENS) TABS Take 1 tablet by mouth daily.   . Omega-3 Fatty Acids (FISH OIL) 1000 MG CAPS Take 1,000 mg by mouth 2 (two) times daily.   Marland Kitchen Propylene Glycol-Glycerin (SOOTHE) 0.6-0.6 % SOLN Apply 1 drop to eye daily as needed (dry eyes).  . traZODone (DESYREL) 100 MG tablet Take 1 tablet (100 mg total) by mouth at bedtime.  Marland Kitchen zolpidem (AMBIEN) 5 MG tablet Take 1 tablet (5 mg total) by mouth at bedtime as needed. for sleep   No facility-administered encounter medications on file as of 05/19/2017.     1. Essential hypertension  No c/o chest pain or headaches. Does not check blood pressures at home  2. Chronic congestive heart failure, unspecified heart failure type ([place])  Has sob all the time. No change in severity. He sees cardiologist every 6 months.  3. Severe obesity (BMI >= 40) (HCC)  No recent weight changes  4.  Primary insomnia  Takes ambien and trazadone to sleep and still sometimes has trouble sleeping  5. Hyperlipidemia with target LDL less than 100  Wife does not make a lot of fried foods but says her husband likes to eat!  6. GAD (generalized anxiety disorder)  Has been on klonopin since he had cancer 5 years ago. Gets anxious if he does not take.  7. 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 Has no apparent sign of cancer at last visit.    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   Review of Systems  Constitutional: Negative for activity change and appetite change.  HENT: Negative.   Eyes: Negative for pain.  Respiratory: Positive for shortness of breath.   Cardiovascular: Positive for leg swelling (some). Negative for palpitations.  Gastrointestinal: Negative for abdominal pain.  Endocrine: Negative for polydipsia.  Genitourinary: Negative.   Skin: Negative for rash.  Neurological: Negative for dizziness, weakness and headaches.  Hematological: Does not bruise/bleed easily.  Psychiatric/Behavioral: Negative.   All other systems reviewed and are negative.      Objective:   Physical Exam  Constitutional: He is oriented to person, place, and time. He appears well-developed and well-nourished.  HENT:  Head: Normocephalic.  Right Ear: External ear normal.  Left Ear: External ear normal.  Nose: Nose normal.  Mouth/Throat: Oropharynx is clear and moist.  Eyes: Pupils are equal, round, and reactive to light. EOM are normal.  Neck: Normal range of motion. Neck supple. No JVD present. No thyromegaly present.  Cardiovascular: Normal rate, regular rhythm, normal heart sounds and intact distal pulses.  Exam reveals no gallop and no friction rub.   No murmur heard. Pulmonary/Chest: Effort normal and breath sounds normal. No respiratory distress. He has no  wheezes. He has no rales. He exhibits no tenderness.  Abdominal: Soft. Bowel sounds are normal. He exhibits no mass. There is no tenderness.  Genitourinary: Prostate normal and penis normal.  Musculoskeletal: Normal range of motion. He exhibits edema (mild bil lower ext).  Lymphadenopathy:    He has no cervical adenopathy.  Neurological: He is alert and oriented to person, place, and time. No cranial nerve deficit.  Skin: Skin is warm and dry.  Psychiatric: He has a normal mood and affect. His behavior is normal. Judgment and thought content normal.   BP 113/80   Pulse 80   Temp (!) 97.1 F (36.2 C) (Oral)   Ht 5' 10"  (1.778 m)   Wt (!) 305 lb (138.3 kg)   BMI 43.76 kg/m         Assessment & Plan:  1. Essential hypertension Low sodium diet - carvedilol (COREG) 3.125 MG tablet; Take 1 tablet (3.125 mg total) by mouth 2 (two) times daily with a meal.  Dispense: 60 tablet; Refill: 5 - losartan (COZAAR) 25 MG tablet; Take 1 tablet (25 mg total) by mouth daily.  Dispense: 30 tablet; Refill: 5 - CMP14+EGFR  2. Chronic congestive heart failure, unspecified heart failure type [facility]) Keep follow up with cardiology  3. Severe obesity (BMI >= 40) (HCC) Discussed diet and exercise for person with BMI >25 Will recheck weight in 3-6 months  4. Primary insomnia Bedtime routine - zolpidem (AMBIEN) 5 MG tablet; Take 1 tablet (5 mg total) by mouth at bedtime as needed. for sleep  Dispense: 30 tablet; Refill: 3 - traZODone (DESYREL) 100 MG tablet; Take 1 tablet (100 mg total) by mouth at bedtime.  Dispense: 30 tablet; Refill: 5  5. Hyperlipidemia with target LDL less than 100 Low fat diet - atorvastatin (LIPITOR) 40 MG tablet; Take 1 tablet (40 mg total) by mouth daily at 6 PM.  Dispense: 30 tablet; Refill: 5 - Lipid panel  6. GAD (generalized anxiety disorder) stress mangagement - escitalopram (LEXAPRO) 20 MG tablet; Take 1 tablet (20 mg total) by mouth daily.  Dispense: 30 tablet;  Refill: 5 - clonazePAM (KLONOPIN) 0.5 MG tablet; Take 1 tablet (0.5 mg total) by mouth 2 (two) times daily.  Dispense: 60 tablet; Refill: 3  7. 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 Environmental allergies - cetirizine (ZYRTEC) 10 MG tablet; TAKE 1 TABLET DAILY  Dispense: 30 tablet; Refill: 5    Labs pending Health maintenance reviewed Diet and exercise encouraged Continue all meds Follow up  In 6 months   [place], FNP

## 2017-05-20 ENCOUNTER — Other Ambulatory Visit: Payer: Self-pay | Admitting: Nurse Practitioner

## 2017-05-20 DIAGNOSIS — R739 Hyperglycemia, unspecified: Secondary | ICD-10-CM

## 2017-05-20 LAB — BAYER DCA HB A1C WAIVED: HB A1C (BAYER DCA - WAIVED): 5.8 % (ref <7.0)

## 2017-05-20 NOTE — Addendum Note (Signed)
Addended by: Liliane Bade on: 05/20/2017 01:51 PM   Modules accepted: Orders

## 2017-07-30 ENCOUNTER — Ambulatory Visit (INDEPENDENT_AMBULATORY_CARE_PROVIDER_SITE_OTHER): Payer: No Typology Code available for payment source

## 2017-07-30 DIAGNOSIS — Z23 Encounter for immunization: Secondary | ICD-10-CM

## 2017-09-26 ENCOUNTER — Encounter: Payer: Self-pay | Admitting: Family

## 2017-09-26 ENCOUNTER — Telehealth: Payer: Self-pay | Admitting: Nurse Practitioner

## 2017-09-26 ENCOUNTER — Ambulatory Visit (INDEPENDENT_AMBULATORY_CARE_PROVIDER_SITE_OTHER): Payer: No Typology Code available for payment source | Admitting: Family

## 2017-09-26 ENCOUNTER — Ambulatory Visit (INDEPENDENT_AMBULATORY_CARE_PROVIDER_SITE_OTHER): Payer: No Typology Code available for payment source

## 2017-09-26 VITALS — BP 127/87 | HR 79 | Temp 97.3°F | Ht 70.0 in | Wt 301.4 lb

## 2017-09-26 DIAGNOSIS — S93401A Sprain of unspecified ligament of right ankle, initial encounter: Secondary | ICD-10-CM | POA: Diagnosis not present

## 2017-09-26 DIAGNOSIS — M25571 Pain in right ankle and joints of right foot: Secondary | ICD-10-CM

## 2017-09-26 MED ORDER — NAPROXEN 500 MG PO TABS: 500.0000 mg | ORAL_TABLET | Freq: Two times a day (BID) | ORAL | 1 refills | Status: DC

## 2017-09-26 NOTE — Progress Notes (Signed)
   Subjective:    Patient ID: Matthew Irwin, male    DOB: 06/10/1956, 61 y.o.   MRN: [id number]  Ankle Pain   The incident occurred more than 1 week ago. The injury mechanism was a twisting injury (three months ago). The pain is present in the right ankle. The quality of the pain is described as aching. The pain is at a severity of 2/10. The pain is mild. The pain has been intermittent since onset. Pertinent negatives include no loss of motion, numbness or tingling. He reports no foreign bodies present. The symptoms are aggravated by weight bearing. He has tried rest, NSAIDs and acetaminophen for the symptoms. The treatment provided mild relief.      Review of Systems  Neurological: Negative for tingling and numbness.  All other systems reviewed and are negative.      Objective:   Physical Exam  Constitutional: He is oriented to person, place, and time. He appears well-developed and well-nourished. No distress.  HENT:  Head: Normocephalic.  Eyes: Pupils are equal, round, and reactive to light. Right eye exhibits no discharge. Left eye exhibits no discharge.  Neck: Normal range of motion. Neck supple. No thyromegaly present.  Cardiovascular: Normal rate, regular rhythm, normal heart sounds and intact distal pulses.  No murmur heard. Pulmonary/Chest: Effort normal and breath sounds normal. No respiratory distress. He has no wheezes.  Abdominal: Soft. Bowel sounds are normal. He exhibits no distension. There is no tenderness.  Musculoskeletal: Normal range of motion. He exhibits tenderness (mild right lateral tenderness with extension ). He exhibits no edema.  Neurological: He is alert and oriented to person, place, and time.  Skin: Skin is warm and dry. No rash noted. No erythema.  Psychiatric: He has a normal mood and affect. His behavior is normal. Judgment and thought content normal.  Vitals reviewed.     BP 127/87   Pulse 79   Temp (!) 97.3 F (36.3 C) (Oral)   Ht 5\' 10"  (1.778  m)   Wt (!) 301 lb 6.4 oz (136.7 kg)   BMI 43.25 kg/m      Assessment & Plan:  1. Acute right ankle pain - DG Ankle Complete Right; Future - naproxen (NAPROSYN) 500 MG tablet; Take 1 tablet (500 mg total) by mouth 2 (two) times daily with a meal.  Dispense: 60 tablet; Refill: 1  2. Sprain of right ankle, unspecified ligament, initial encounter Rest Ice  ROM exercises RTO prn  - naproxen (NAPROSYN) 500 MG tablet; Take 1 tablet (500 mg total) by mouth 2 (two) times daily with a meal.  Dispense: 60 tablet; Refill: [place], FNP

## 2017-09-26 NOTE — Patient Instructions (Signed)
Ankle Sprain An ankle sprain is a stretch or tear in one of the tough, fiber-like tissues (ligaments) in the ankle. The ligaments in your ankle help to hold the bones of the ankle together. What are the causes? This condition is often caused by stepping on or falling on the outer edge of the foot. What increases the risk? This condition is more likely to develop in people who play sports. What are the signs or symptoms? Symptoms of this condition include:  Pain in your ankle.  Swelling.  Bruising. Bruising may develop right after you sprain your ankle or 1-2 days later.  Trouble standing or walking, especially when you turn or change directions. How is this diagnosed? This condition is diagnosed with a physical exam. During the exam, your health care provider will press on certain parts of your foot and ankle and try to move them in certain ways. X-rays may be taken to see how severe the sprain is and to check for broken bones. How is this treated? This condition may be treated with:  A brace. This is used to keep the ankle from moving until it heals.  An elastic bandage. This is used to support the ankle.  Crutches.  Pain medicine.  Surgery. This may be needed if the sprain is severe.  Physical therapy. This may help to improve the range of motion in the ankle. Follow these instructions at home:  Rest your ankle.  Take over-the-counter and prescription medicines only as told by your health care provider.  For 2-3 days, keep your ankle raised (elevated) above the level of your heart as much as possible.  If directed, apply ice to the area:  Put ice in a plastic bag.  Place a towel between your skin and the bag.  Leave the ice on for 20 minutes, 2-3 times a day.  If you were given a brace:  Wear it as directed.  Remove it to shower or bathe.  Try not to move your ankle much, but wiggle your toes from time to time. This helps to prevent swelling.  If you were  given an elastic bandage (dressing):  Remove it to shower or bathe.  Try not to move your ankle much, but wiggle your toes from time to time. This helps to prevent swelling.  Adjust the dressing to make it more comfortable if it feels too tight.  Loosen the dressing if you have numbness or tingling in your foot, or if your foot becomes cold and blue.  If you have crutches, use them as told by your health care provider. Continue to use them until you can walk without feeling pain in your ankle. Contact a health care provider if:  You have rapidly increasing bruising or swelling.  Your pain is not relieved with medicine. Get help right away if:  Your toes or foot becomes numb or blue.  You have severe pain that gets worse. This information is not intended to replace advice given to you by your health care provider. Make sure you discuss any questions you have with your health care provider. Document Released: 10/07/2005 Document Revised: 02/14/2016 Document Reviewed: 05/09/2015 Elsevier Interactive Patient Education  2017 Elsevier Inc.  

## 2017-09-26 NOTE — Telephone Encounter (Signed)
appt scheduled

## 2017-11-07 ENCOUNTER — Other Ambulatory Visit (HOSPITAL_COMMUNITY): Payer: BLUE CROSS/BLUE SHIELD

## 2017-11-14 ENCOUNTER — Ambulatory Visit (HOSPITAL_COMMUNITY): Payer: BLUE CROSS/BLUE SHIELD | Admitting: Adult Health

## 2017-11-18 ENCOUNTER — Other Ambulatory Visit (HOSPITAL_COMMUNITY): Payer: Self-pay | Admitting: *Deleted

## 2017-11-18 DIAGNOSIS — C8339 Diffuse large B-cell lymphoma, extranodal and solid organ sites: Secondary | ICD-10-CM

## 2017-11-18 DIAGNOSIS — C83398 Diffuse large b-cell lymphoma of other extranodal and solid organ sites: Secondary | ICD-10-CM

## 2017-11-19 ENCOUNTER — Other Ambulatory Visit: Payer: Self-pay

## 2017-11-19 ENCOUNTER — Encounter (HOSPITAL_COMMUNITY): Payer: Self-pay | Admitting: Adult Health

## 2017-11-19 ENCOUNTER — Inpatient Hospital Stay (HOSPITAL_COMMUNITY): Payer: No Typology Code available for payment source | Attending: Oncology

## 2017-11-19 ENCOUNTER — Inpatient Hospital Stay (HOSPITAL_COMMUNITY): Payer: No Typology Code available for payment source | Attending: Internal Medicine | Admitting: Adult Health

## 2017-11-19 VITALS — BP 134/60 | HR 58 | Temp 98.0°F | Resp 16 | Ht 70.0 in | Wt 300.4 lb

## 2017-11-19 DIAGNOSIS — Z923 Personal history of irradiation: Secondary | ICD-10-CM | POA: Diagnosis not present

## 2017-11-19 DIAGNOSIS — C8339 Diffuse large B-cell lymphoma, extranodal and solid organ sites: Secondary | ICD-10-CM | POA: Diagnosis not present

## 2017-11-19 DIAGNOSIS — C83398 Diffuse large b-cell lymphoma of other extranodal and solid organ sites: Secondary | ICD-10-CM

## 2017-11-19 DIAGNOSIS — D696 Thrombocytopenia, unspecified: Secondary | ICD-10-CM

## 2017-11-19 LAB — COMPREHENSIVE METABOLIC PANEL
ALT: 48 U/L (ref 17–63)
ANION GAP: 8 (ref 5–15)
AST: 45 U/L — ABNORMAL HIGH (ref 15–41)
Albumin: 3.9 g/dL (ref 3.5–5.0)
Alkaline Phosphatase: 71 U/L (ref 38–126)
BUN: 15 mg/dL (ref 6–20)
CALCIUM: 9.1 mg/dL (ref 8.9–10.3)
CHLORIDE: 101 mmol/L (ref 101–111)
CO2: 26 mmol/L (ref 22–32)
CREATININE: 0.94 mg/dL (ref 0.61–1.24)
Glucose, Bld: 101 mg/dL — ABNORMAL HIGH (ref 65–99)
Potassium: 3.9 mmol/L (ref 3.5–5.1)
SODIUM: 135 mmol/L (ref 135–145)
Total Bilirubin: 0.9 mg/dL (ref 0.3–1.2)
Total Protein: 7.7 g/dL (ref 6.5–8.1)

## 2017-11-19 LAB — CBC WITH DIFFERENTIAL/PLATELET
Basophils Absolute: 0 10*3/uL (ref 0.0–0.1)
Basophils Relative: 0 %
EOS ABS: 0.3 10*3/uL (ref 0.0–0.7)
EOS PCT: 5 %
HCT: 40.8 % (ref 39.0–52.0)
Hemoglobin: 13.4 g/dL (ref 13.0–17.0)
LYMPHS ABS: 1.3 10*3/uL (ref 0.7–4.0)
LYMPHS PCT: 21 %
MCH: 30.7 pg (ref 26.0–34.0)
MCHC: 32.8 g/dL (ref 30.0–36.0)
MCV: 93.4 fL (ref 78.0–100.0)
MONO ABS: 0.7 10*3/uL (ref 0.1–1.0)
MONOS PCT: 12 %
Neutro Abs: 3.6 10*3/uL (ref 1.7–7.7)
Neutrophils Relative %: 62 %
PLATELETS: 148 10*3/uL — AB (ref 150–400)
RBC: 4.37 MIL/uL (ref 4.22–5.81)
RDW: 12.2 % (ref 11.5–15.5)
WBC: 5.9 10*3/uL (ref 4.0–10.5)

## 2017-11-19 NOTE — Patient Instructions (Signed)
[facility] Discharge Instructions  RECOMMENDATIONS MADE BY THE CONSULTANT AND ANY TEST RESULTS WILL BE SENT TO YOUR REFERRING PHYSICIAN.  You were seen today by Gretchen Dawson NP.   Thank you for choosing [facility] at [facility] to provide your oncology and hematology care.  To afford each patient quality time with our provider, please arrive at least 15 minutes before your scheduled appointment time.    If you have a lab appointment with the Cancer Center please come in thru the  Main Entrance and check in at the main information desk  You need to re-schedule your appointment should you arrive 10 or more minutes late.  We strive to give you quality time with our providers, and arriving late affects you and other patients whose appointments are after yours.  Also, if you no show three or more times for appointments you may be dismissed from the clinic at the providers discretion.     Again, thank you for choosing [facility].  Our hope is that these requests will decrease the amount of time that you wait before being seen by our physicians.       _____________________________________________________________  Should you have questions after your visit to [facility], please contact our office at [phone] between the hours of 8:30 a.m. and 4:30 p.m.  Voicemails left after 4:30 p.m. will not be returned until the following business day.  For prescription refill requests, have your pharmacy contact our office.       Resources For Cancer Patients and their Caregivers ? American Cancer Society: Can assist with transportation, wigs, general needs, runs Look Good Feel Better.        1-888-227-6333 ? Cancer Care: Provides financial assistance, online support groups, medication/co-pay assistance.  1-800-813-HOPE (4673) ? [facility] Assists [place] cancer patients and  their families through emotional , educational and financial support.  336-427-4357 ? [place] DSS Where to apply for food stamps, Medicaid and utility assistance. 336-342-1394 ? [facility]: Transportation to medical appointments. 336-347-2287 ? Social Security Administration: May apply for disability if have a Stage IV cancer. 336-342-7796 1-800-772-1213 ? [place] Aging, Disability and Transit Services: Assists with nutrition, care and transit needs. 336-349-2343  Cancer Center Support Programs: [email or web address]@ > Cancer Support Group  2nd Tuesday of the month 1pm-2pm, Journey Room  > Creative Journey  3rd Tuesday of the month 1130am-1pm, Journey Room  > Look Good Feel Better  1st Wednesday of the month 10am-12 noon, Journey Room (Call American Cancer Society to register 1-800-395-5775)    

## 2017-11-19 NOTE — Progress Notes (Signed)
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      08/29/2008 PET scan    Single area of hypermetabolic activity is identified corresponding to the known soft tissue lesion at the level of T6.      08/30/2008 Bone Marrow Biopsy    Normocellular marrow with trilineage hematopoiesis.      10/10/2008 PET scan    Marked interval decrease in size and activity associated with T6 vertebral body metastasis.      10/17/2008 Procedure    CSF exam shows scattered lympocytes (reactive)      12/12/2008 Remission    PET- The destructive lesion at T6 has metabolic activity similar to surrounding tissues and mildly decreased compared to last exam.      08/24/2009 PET scan    NED.  New pericardial and pleural effusions      01/11/2010 -  Radiation Therapy    Involved field radiation       03/07/2010 PET scan    Medium-sized focus of moderate increased uptake within T7 vertebra in region of previously noted tumor.  Resolution of pericardial effusion.  Increase in left pleural effusion.      03/09/2010 Imaging    MRI T-spine- Acute milkd compression fxn of T7, suspicious for pathologic fracture/recurrence..  Acute mild compression fxn at T4, primarily superior endplate.  Cannot rule out tumor.      03/16/2010 Procedure    T-7 vertebral biopsy shows fibrosis and new bone formation      07/16/2010 Imaging    MRI T-spine- Decreased marrow  edema and enhancement involving fractures at T4 and T7, compatible with healing fxn.        HISTORY OF PRESENT ILLNESS: Matthew Irwin 62 y.o. 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     INTERVAL HISTORY:  Matthew Irwin 62 y.o. male returns for routine follow-up for h/o DLBCL.   Here today with his wife.   Overall, he tells me he has been feeling very well.  Appetite 100%; energy levels 75%.  Denies any fever/chills, infections, night sweats, adenopathy, shortness of breath, cough, abdominal pain, N&V, urinary concerns, rash, dizziness, headaches, back pain, or easy bleeding/bruising.  He has difficulty sleeping at times; he takes Ambien which is helpful.   Recently had 2 lesions removed from his face; 1 to his (L) ear which reportedly was basal cell carcinoma; the other lesion was removed from his nose and he tells me he has not received the report on that lesion yet.   His port-a-cath was removed by Dr. Arnoldo Morale in 11/2016. He has had no complications  or sequelae from this procedure.   He continues to see his PCP regularly. Otherwise, he is largely without complaints today.   I personally reviewed and went over laboratory results with the patient.  The results are noted within this dictation. They will be updated today.     REVIEW OF SYSTEMS:  Review of Systems  Constitutional: Positive for fatigue (occasional fatigue).  HENT:  Negative.  Negative for lump/mass.   Eyes: Negative.   Respiratory: Negative.  Negative for cough and shortness of breath.   Cardiovascular: Negative.   Gastrointestinal: Negative.  Negative for abdominal pain,  constipation, diarrhea, nausea and vomiting.  Endocrine: Negative.   Genitourinary: Negative.    Musculoskeletal: Positive for arthralgias (occasional arthralgias, which he attributes to "old age").  Skin: Negative.  Negative for rash.  Neurological: Negative.  Negative for dizziness, extremity weakness and headaches.  Hematological: Negative.   Psychiatric/Behavioral: Positive for sleep disturbance.     PAST MEDICAL/SURGICAL HISTORY:  Past Medical History:  Diagnosis Date  . ACEI/ARB contraindicated   . Anxiety   . Bronchitis   . Cardiomyopathy   . Chest pain    left heart catherization  . CHF (congestive heart failure) ([place])   . History of cardiac catheterization   . HTN (hypertension)   . Hyperlipidemia    myalgias with pravastatin and simvastatin.  Marland Kitchen NHL (non-Hodgkin's lymphoma) ([place])    history of  . Obesity 08/16/2011  . Sinus infection   . Subacute effusive constrictive pericarditis    Past Surgical History:  Procedure Laterality Date  . BACK SURGERY  07/2008   tumor removed off spine  . heart sack removed  07/2009  . HEMORRHOID SURGERY    . LEFT HEART CATH  10/10  . NASAL SINUS SURGERY    . PORT-A-CATH REMOVAL Left 12/13/2016   Procedure: MINOR REMOVAL PORT-A-CATH;  Surgeon: Aviva Signs, MD;  Location: [facility];  Service: General;  Laterality: Left;  . PORTACATH PLACEMENT    . TUMOR REMOVAL      CURRENT MEDICATIONS:  Outpatient Encounter Medications as of 11/19/2017  Medication Sig  . aspirin 81 MG tablet Take 81 mg by mouth at bedtime.   Marland Kitchen atorvastatin (LIPITOR) 40 MG tablet Take 1 tablet (40 mg total) by mouth daily at 6 PM.  . carvedilol (COREG) 3.125 MG tablet Take 1 tablet (3.125 mg total) by mouth 2 (two) times daily with a meal.  . cetirizine (ZYRTEC) 10 MG tablet TAKE 1 TABLET DAILY  . clonazePAM (KLONOPIN) 0.5 MG tablet Take 1 tablet (0.5 mg total) by mouth 2 (two) times daily.  Marland Kitchen escitalopram (LEXAPRO) 20 MG tablet Take 1 tablet (20 mg total) by  mouth daily.  Marland Kitchen ketotifen (ALAWAY) 0.025 % ophthalmic solution Place 2 drops into both eyes 2 (two) times daily.  Marland Kitchen losartan (COZAAR) 25 MG tablet Take 1 tablet (25 mg total) by mouth daily.  . Multiple Vitamins-Minerals (CENTRUM SILVER ULTRA MENS) TABS Take 1 tablet by mouth daily.   . naproxen (NAPROSYN) 500 MG tablet Take 1 tablet (500 mg total) by mouth 2 (two) times daily with a meal.  . Omega-3 Fatty Acids (FISH OIL) 1000 MG CAPS Take 1,000 mg by mouth 2 (two) times daily.   Marland Kitchen Propylene Glycol-Glycerin (SOOTHE) 0.6-0.6 % SOLN Apply 1 drop to eye daily as needed (dry eyes).  . traZODone (DESYREL) 100 MG tablet Take 1 tablet (100 mg total) by mouth at bedtime.  Marland Kitchen zolpidem (AMBIEN) 5 MG tablet Take 1 tablet (5 mg  total) by mouth at bedtime as needed. for sleep   No facility-administered encounter medications on file as of 11/19/2017.     ALLERGIES:  Allergies  Allergen Reactions  . Penicillins Hives    Has patient had a PCN reaction causing immediate rash, facial/tongue/throat swelling, SOB or lightheadedness with hypotension: No Has patient had a PCN reaction causing severe rash involving mucus membranes or skin necrosis: Yes Has patient had a PCN reaction that required hospitalization No Has patient had a PCN reaction occurring within the last 10 years: No If all of the above answers are "NO", then may proceed with Cephalosporin use.   . Simvastatin     REACTION: myalgia     PHYSICAL EXAMINATION  ECOG PERFORMANCE STATUS: 0 - Asymptomatic  Vitals:   11/19/17 1209  BP: 134/60  Pulse: (!) 58  Resp: 16  Temp: 98 F (36.7 C)  SpO2: 94%    Filed Weights   11/19/17 1209  Weight: (!) 300 lb 6.4 oz (136.3 kg)     Physical Exam  Constitutional: He is oriented to person, place, and time and well-developed, well-nourished, and in no distress.  HENT:  Head: Normocephalic.  Mouth/Throat: Oropharynx is clear and moist. No oropharyngeal exudate.  Eyes: Conjunctivae are normal.  Pupils are equal, round, and reactive to light. No scleral icterus.  Neck: Normal range of motion. Neck supple.  Cardiovascular: Normal rate and regular rhythm.  Pulmonary/Chest: Effort normal and breath sounds normal. No respiratory distress.  Abdominal: Soft. Bowel sounds are normal. There is no tenderness. There is no rebound.  Musculoskeletal: Normal range of motion. He exhibits no edema.  Lymphadenopathy:    He has no cervical adenopathy.    He has no axillary adenopathy.       Right: No inguinal and no supraclavicular adenopathy present.       Left: No inguinal and no supraclavicular adenopathy present.  Neurological: He is alert and oriented to person, place, and time. No cranial nerve deficit. Gait normal.  Skin: Skin is warm and dry. No rash noted.  Psychiatric: Mood, memory, affect and judgment normal.  Nursing note and vitals reviewed.   LABORATORY DATA: CBC    Component Value Date/Time   WBC 5.9 11/19/2017 1046   RBC 4.37 11/19/2017 1046   HGB 13.4 11/19/2017 1046   HCT 40.8 11/19/2017 1046   PLT 148 (L) 11/19/2017 1046   MCV 93.4 11/19/2017 1046   MCH 30.7 11/19/2017 1046   MCHC 32.8 11/19/2017 1046   RDW 12.2 11/19/2017 1046   LYMPHSABS 1.3 11/19/2017 1046   MONOABS 0.7 11/19/2017 1046   EOSABS 0.3 11/19/2017 1046   BASOSABS 0.0 11/19/2017 1046   CMP Latest Ref Rng & Units 11/19/2017 05/19/2017 02/14/2017  Glucose 65 - 99 mg/dL 101(H) 113(H) 100(H)  BUN 6 - 20 mg/dL 15 13 16   Creatinine 0.61 - 1.24 mg/dL 0.94 0.90 0.90  Sodium 135 - 145 mmol/L 135 140 139  Potassium 3.5 - 5.1 mmol/L 3.9 4.6 4.3  Chloride 101 - 111 mmol/L 101 101 99  CO2 22 - 32 mmol/L 26 26 28   Calcium 8.9 - 10.3 mg/dL 9.1 9.1 9.3  Total Protein 6.5 - 8.1 g/dL 7.7 7.2 6.9  Total Bilirubin 0.3 - 1.2 mg/dL 0.9 0.6 0.7  Alkaline Phos 38 - 126 U/L 71 74 77  AST 15 - 41 U/L 45(H) 40 50(H)  ALT 17 - 63 U/L 48 54(H) 61(H)      RADIOGRAPHIC STUDIES:  No results found.  ASSESSMENT AND  PLAN:  Matthew Irwin is a pleasant 62 y.o. male with h/o diffuse large B-cell lymphoma at T6 vertebral body, CD20+, diagnosed in 07/2008. Underwent surgical excision, R-CHOP chemotherapy x 6 cycles, with IT methotrexate during cycles #3-6. Also underwent radiation therapy.    DLBCL, CD20+ at T6 vertebral body:  -Clinically without evidence of disease on exam. No lymphadenopathy. No back pain or current complaints. LDH normal.  Given that he is now 9+ years out from his original diagnosis without evidence of recurrence, this is very favorable.  His risk of recurrence at this time is quite low. -His port-a-cath was removed in 11/2016.    -We discussed the option of "graduating" him from follow-up here at the cancer center and allowing his PCP to assume responsibility for his lymphoma surveillance.  He is enthusiastic about this plan.  Would recommend annual labs with CBC with diff, CMET, and LDH, as well as at least annual physical exam. No role for surveillance imaging; would only obtain imaging as clinically indicated with new physical exam findings or patient symptoms.  If there are ever any questions or concerns from the patient or his PCP, we would be happy to see the patient again if needed.  Otherwise, it has been a pleasure caring for Matthew Irwin and I wished him well.    Very mild thrombocytopenia:  -Plts 148,000 today. Could be secondary to liver dysfunction with possible fatty liver disease. Clinically, he denies any bleeding or abnormal bruising.  Would just continue to monitor at this time.   Chronic transaminitis:  -Chronic transient mild elevation in AST/ALT.  ALT normal today. AST mildly elevated at 45. No hepatomegaly on physical exam. Denies any abdominal pain. No jaundice. Could also be secondary to liver dysfunction with possible fatty liver disease.  No role for additional work-up from an oncologic standpoint.    Health/Wellness promotion:  -Encouraged patient to maintain follow-up visits  with his PCP as they deem clinically appropriate.       Dispo: -"Graduate" from follow-up at cancer center today. Of course, if he has any future concerns, we would be happy to see him again at any time in the future.    Plan of care discussed with Dr. Sherrine Maples, who agrees with above plan of care.    THERAPY PLAN:  NCCN guidelines recommends the follow surveillance for Stage I-IV NHL for those who attain a complete response to therapy:  A. H&P every 3-6 months for 5 years, then yearly or as clinically indicated.  B. Labs every 3-6 months for 5 years and then annually or as clinically indicated.  C. Repeat CT scans only as clinically indicated.     Orders placed this encounter:  No orders of the defined types were placed in this encounter.     Mike Craze, NP [place] [phone]

## 2017-11-20 ENCOUNTER — Ambulatory Visit: Payer: No Typology Code available for payment source | Admitting: Nurse Practitioner

## 2017-11-25 ENCOUNTER — Encounter: Payer: Self-pay | Admitting: Nurse Practitioner

## 2017-11-25 ENCOUNTER — Ambulatory Visit: Payer: PRIVATE HEALTH INSURANCE | Admitting: Nurse Practitioner

## 2017-11-25 VITALS — BP 146/84 | HR 66 | Temp 97.4°F | Ht 70.0 in | Wt 306.0 lb

## 2017-11-25 DIAGNOSIS — M25571 Pain in right ankle and joints of right foot: Secondary | ICD-10-CM

## 2017-11-25 DIAGNOSIS — C8339 Diffuse large B-cell lymphoma, extranodal and solid organ sites: Secondary | ICD-10-CM | POA: Diagnosis not present

## 2017-11-25 DIAGNOSIS — E785 Hyperlipidemia, unspecified: Secondary | ICD-10-CM | POA: Diagnosis not present

## 2017-11-25 DIAGNOSIS — I10 Essential (primary) hypertension: Secondary | ICD-10-CM

## 2017-11-25 DIAGNOSIS — F411 Generalized anxiety disorder: Secondary | ICD-10-CM

## 2017-11-25 DIAGNOSIS — I509 Heart failure, unspecified: Secondary | ICD-10-CM | POA: Diagnosis not present

## 2017-11-25 DIAGNOSIS — I311 Chronic constrictive pericarditis: Secondary | ICD-10-CM | POA: Diagnosis not present

## 2017-11-25 DIAGNOSIS — S93401A Sprain of unspecified ligament of right ankle, initial encounter: Secondary | ICD-10-CM

## 2017-11-25 DIAGNOSIS — F5101 Primary insomnia: Secondary | ICD-10-CM

## 2017-11-25 MED ORDER — CLONAZEPAM 0.5 MG PO TABS: 0.5000 mg | ORAL_TABLET | Freq: Two times a day (BID) | ORAL | 3 refills | Status: DC

## 2017-11-25 MED ORDER — DULOXETINE HCL 60 MG PO CPEP: 60.0000 mg | ORAL_CAPSULE | Freq: Every day | ORAL | 5 refills | Status: DC

## 2017-11-25 MED ORDER — ATORVASTATIN CALCIUM 40 MG PO TABS: 40.0000 mg | ORAL_TABLET | Freq: Every day | ORAL | 5 refills | Status: DC

## 2017-11-25 MED ORDER — LOSARTAN POTASSIUM 25 MG PO TABS: 25.0000 mg | ORAL_TABLET | Freq: Every day | ORAL | 5 refills | Status: DC

## 2017-11-25 MED ORDER — TRAZODONE HCL 100 MG PO TABS: 100.0000 mg | ORAL_TABLET | Freq: Every day | ORAL | 5 refills | Status: DC

## 2017-11-25 MED ORDER — ZOLPIDEM TARTRATE 5 MG PO TABS: 5.0000 mg | ORAL_TABLET | Freq: Every evening | ORAL | 3 refills | Status: DC | PRN

## 2017-11-25 MED ORDER — CARVEDILOL 3.125 MG PO TABS: 3.1250 mg | ORAL_TABLET | Freq: Two times a day (BID) | ORAL | 5 refills | Status: DC

## 2017-11-25 MED ORDER — NAPROXEN 500 MG PO TABS: 500.0000 mg | ORAL_TABLET | Freq: Two times a day (BID) | ORAL | 1 refills | Status: DC

## 2017-11-25 NOTE — Progress Notes (Signed)
Subjective:    Patient ID: Matthew Irwin, male    DOB: 1956-01-05, 62 y.o.   MRN: [id number]  HPI   Matthew Irwin is here today for follow up of chronic medical problem.  Outpatient Encounter Medications as of 11/25/2017  Medication Sig  . aspirin 81 MG tablet Take 81 mg by mouth at bedtime.   Marland Kitchen atorvastatin (LIPITOR) 40 MG tablet Take 1 tablet (40 mg total) by mouth daily at 6 PM.  . carvedilol (COREG) 3.125 MG tablet Take 1 tablet (3.125 mg total) by mouth 2 (two) times daily with a meal.  . cetirizine (ZYRTEC) 10 MG tablet TAKE 1 TABLET DAILY  . clonazePAM (KLONOPIN) 0.5 MG tablet Take 1 tablet (0.5 mg total) by mouth 2 (two) times daily.  Marland Kitchen escitalopram (LEXAPRO) 20 MG tablet Take 1 tablet (20 mg total) by mouth daily.  Marland Kitchen ketotifen (ALAWAY) 0.025 % ophthalmic solution Place 2 drops into both eyes 2 (two) times daily.  Marland Kitchen losartan (COZAAR) 25 MG tablet Take 1 tablet (25 mg total) by mouth daily.  . Multiple Vitamins-Minerals (CENTRUM SILVER ULTRA MENS) TABS Take 1 tablet by mouth daily.   . naproxen (NAPROSYN) 500 MG tablet Take 1 tablet (500 mg total) by mouth 2 (two) times daily with a meal.  . Omega-3 Fatty Acids (FISH OIL) 1000 MG CAPS Take 1,000 mg by mouth 2 (two) times daily.   Marland Kitchen Propylene Glycol-Glycerin (SOOTHE) 0.6-0.6 % SOLN Apply 1 drop to eye daily as needed (dry eyes).  . traZODone (DESYREL) 100 MG tablet Take 1 tablet (100 mg total) by mouth at bedtime.  Marland Kitchen zolpidem (AMBIEN) 5 MG tablet Take 1 tablet (5 mg total) by mouth at bedtime as needed. for sleep     1. Constrictive pericarditis  Has not seen  cardiology several years. Has been doing well, SOB is stable.  2. Chronic congestive heart failure, unspecified heart failure type ([place]) Some lower leg edema throughout day but will usually resolve at night. Again sob is stable.   3. Essential hypertension  Does not check blood pressure at home. Denies any chest pain  4. Diffuse large B-cell lymphoma of solid organ excluding  spleen [facility])  Saw oncologist 11/19/17. He was released from their care because he is 10 years out with no occurrence.  5. Hyperlipidemia with target LDL less than 100  He does not watch his diet  6. GAD (generalized anxiety disorder)  Stays stressed but mainly due to being self employeed.  7. Primary insomnia  Cannot sleep without his ambien and trazadone  8. Severe obesity (BMI >= 40) (HCC)  No recent weight changes.    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  * going to be grandpa in June  Review of Systems  Constitutional: Negative for activity change and appetite change.  HENT: Negative.   Eyes: Negative for pain.  Respiratory: Positive for shortness of breath (stable).   Cardiovascular: Negative for chest pain, palpitations and leg swelling.  Gastrointestinal: Negative for abdominal pain.  Endocrine: Negative for polydipsia.  Genitourinary: Negative.   Musculoskeletal: Positive for back pain (occasional).  Skin: Negative for rash.  Neurological: Negative for dizziness, weakness and headaches.  Hematological: Does not bruise/bleed easily.  Psychiatric/Behavioral: Negative.   All other systems reviewed and are negative.     / Objective:   Physical Exam  Constitutional: He is oriented to person, place, and time. He appears well-developed and  well-nourished.  HENT:  Head: Normocephalic.  Right Ear: External ear normal.  Left Ear: External ear normal.  Nose: Nose normal.  Mouth/Throat: Oropharynx is clear and moist.  Eyes: EOM are normal. Pupils are equal, round, and reactive to light.  Neck: Normal range of motion. Neck supple. No JVD present. No thyromegaly present.  Cardiovascular: Normal rate, regular rhythm, normal heart sounds and intact distal pulses. Exam reveals no gallop and no friction rub.  No murmur heard. Pulmonary/Chest: Effort normal and breath sounds normal. No respiratory  distress. He has no wheezes. He has no rales. He exhibits no tenderness.  Abdominal: Soft. Bowel sounds are normal. He exhibits no mass. There is no tenderness.  Genitourinary: Prostate normal and penis normal.  Musculoskeletal: Normal range of motion. He exhibits no edema.  Lymphadenopathy:    He has no cervical adenopathy.  Neurological: He is alert and oriented to person, place, and time. No cranial nerve deficit.  Skin: Skin is warm and dry.  Psychiatric: He has a normal mood and affect. His behavior is normal. Judgment and thought content normal.   BP (!) 150/85   Pulse 66   Temp (!) 97.4 F (36.3 C) (Oral)   Ht 5' 10"  (1.778 m)   Wt (!) 306 lb (138.8 kg)   BMI 43.91 kg/m        Assessment & Plan:  1. Constrictive pericarditis Needs to followup with cardiology  2. Chronic congestive heart failure, unspecified heart failure type ([place])  3. Essential hypertension Low sodium diet - CMP14+EGFR - carvedilol (COREG) 3.125 MG tablet; Take 1 tablet (3.125 mg total) by mouth 2 (two) times daily with a meal.  Dispense: 60 tablet; Refill: 5 - losartan (COZAAR) 25 MG tablet; Take 1 tablet (25 mg total) by mouth daily.  Dispense: 30 tablet; Refill: 5  4. Diffuse large B-cell lymphoma of solid organ excluding spleen ([place]) Released from oncology - CBC with Differential/Platelet - Lactate Dehydrogenase  5. Hyperlipidemia with target LDL less than 100 Low fat diet - Lipid panel - atorvastatin (LIPITOR) 40 MG tablet; Take 1 tablet (40 mg total) by mouth daily at 6 PM.  Dispense: 30 tablet; Refill: 5  6. GAD (generalized anxiety disorder) Stress management - clonazePAM (KLONOPIN) 0.5 MG tablet; Take 1 tablet (0.5 mg total) by mouth 2 (two) times daily.  Dispense: 60 tablet; Refill: 3  7. Primary insomnia Bedtime routine - zolpidem (AMBIEN) 5 MG tablet; Take 1 tablet (5 mg total) by mouth at bedtime as needed. for sleep  Dispense: 30 tablet; Refill: 3 - traZODone (DESYREL) 100 MG  tablet; Take 1 tablet (100 mg total) by mouth at bedtime.  Dispense: 30 tablet; Refill: 5  8. Severe obesity (BMI >= 40) (HCC) Discussed diet and exercise for person with BMI >25 Will recheck weight in 3-6 months    Labs pending Health maintenance reviewed Diet and exercise encouraged Continue all meds Follow up  In 3 months  [place], FNP

## 2017-11-25 NOTE — Addendum Note (Signed)
Addended by: [name] on: 11/25/2017 09:24 AM   Modules accepted: Orders

## 2017-11-25 NOTE — Patient Instructions (Signed)

## 2017-11-26 LAB — CMP14+EGFR
A/G RATIO: 1.4 (ref 1.2–2.2)
ALBUMIN: 4 g/dL (ref 3.6–4.8)
ALK PHOS: 76 IU/L (ref 39–117)
ALT: 49 IU/L — ABNORMAL HIGH (ref 0–44)
AST: 42 IU/L — AB (ref 0–40)
BUN / CREAT RATIO: 20 (ref 10–24)
BUN: 18 mg/dL (ref 8–27)
Bilirubin Total: 0.7 mg/dL (ref 0.0–1.2)
CO2: 26 mmol/L (ref 20–29)
Calcium: 9.1 mg/dL (ref 8.6–10.2)
Chloride: 101 mmol/L (ref 96–106)
Creatinine, Ser: 0.92 mg/dL (ref 0.76–1.27)
GFR calc Af Amer: 104 mL/min/{1.73_m2} (ref >59)
GFR calc non Af Amer: 90 mL/min/{1.73_m2} (ref >59)
GLOBULIN, TOTAL: 2.9 g/dL (ref 1.5–4.5)
Glucose: 105 mg/dL — ABNORMAL HIGH (ref 65–99)
POTASSIUM: 4.1 mmol/L (ref 3.5–5.2)
SODIUM: 142 mmol/L (ref 134–144)
Total Protein: 6.9 g/dL (ref 6.0–8.5)

## 2017-11-26 LAB — LIPID PANEL
CHOLESTEROL TOTAL: 134 mg/dL (ref 100–199)
Chol/HDL Ratio: 3.9 ratio (ref 0.0–5.0)
HDL: 34 mg/dL — ABNORMAL LOW (ref >39)
LDL Calculated: 50 mg/dL (ref 0–99)
Triglycerides: 252 mg/dL — ABNORMAL HIGH (ref 0–149)
VLDL Cholesterol Cal: 50 mg/dL — ABNORMAL HIGH (ref 5–40)

## 2017-11-26 LAB — CBC WITH DIFFERENTIAL/PLATELET
BASOS ABS: 0 10*3/uL (ref 0.0–0.2)
BASOS: 0 %
EOS (ABSOLUTE): 0.3 10*3/uL (ref 0.0–0.4)
Eos: 5 %
HEMOGLOBIN: 13 g/dL (ref 13.0–17.7)
Hematocrit: 39.1 % (ref 37.5–51.0)
IMMATURE GRANS (ABS): 0 10*3/uL (ref 0.0–0.1)
IMMATURE GRANULOCYTES: 0 %
LYMPHS: 21 %
Lymphocytes Absolute: 1.1 10*3/uL (ref 0.7–3.1)
MCH: 30.2 pg (ref 26.6–33.0)
MCHC: 33.2 g/dL (ref 31.5–35.7)
MCV: 91 fL (ref 79–97)
MONOCYTES: 8 %
Monocytes Absolute: 0.4 10*3/uL (ref 0.1–0.9)
NEUTROS ABS: 3.3 10*3/uL (ref 1.4–7.0)
NEUTROS PCT: 66 %
PLATELETS: 167 10*3/uL (ref 150–379)
RBC: 4.3 x10E6/uL (ref 4.14–5.80)
RDW: 13.1 % (ref 12.3–15.4)
WBC: 5.1 10*3/uL (ref 3.4–10.8)

## 2017-11-26 LAB — LACTATE DEHYDROGENASE: LDH: 188 IU/L (ref 121–224)

## 2018-04-17 ENCOUNTER — Other Ambulatory Visit: Payer: Self-pay | Admitting: Nurse Practitioner

## 2018-04-17 DIAGNOSIS — F5101 Primary insomnia: Secondary | ICD-10-CM

## 2018-04-18 ENCOUNTER — Telehealth: Payer: Self-pay | Admitting: Nurse Practitioner

## 2018-04-18 DIAGNOSIS — F5101 Primary insomnia: Secondary | ICD-10-CM

## 2018-04-18 MED ORDER — ZOLPIDEM TARTRATE 5 MG PO TABS: 5.0000 mg | ORAL_TABLET | Freq: Every evening | ORAL | 3 refills | Status: DC | PRN

## 2018-04-18 NOTE — Telephone Encounter (Signed)
Requesting refill - please advise  

## 2018-05-22 ENCOUNTER — Other Ambulatory Visit: Payer: Self-pay | Admitting: Nurse Practitioner

## 2018-05-22 DIAGNOSIS — Z9109 Other allergy status, other than to drugs and biological substances: Secondary | ICD-10-CM

## 2018-05-26 ENCOUNTER — Ambulatory Visit (INDEPENDENT_AMBULATORY_CARE_PROVIDER_SITE_OTHER): Payer: PRIVATE HEALTH INSURANCE | Admitting: Nurse Practitioner

## 2018-05-26 ENCOUNTER — Encounter: Payer: Self-pay | Admitting: Nurse Practitioner

## 2018-05-26 VITALS — BP 120/83 | HR 79 | Temp 97.4°F | Ht 70.0 in | Wt 298.0 lb

## 2018-05-26 DIAGNOSIS — C8339 Diffuse large B-cell lymphoma, extranodal and solid organ sites: Secondary | ICD-10-CM

## 2018-05-26 DIAGNOSIS — Z1212 Encounter for screening for malignant neoplasm of rectum: Secondary | ICD-10-CM

## 2018-05-26 DIAGNOSIS — F411 Generalized anxiety disorder: Secondary | ICD-10-CM

## 2018-05-26 DIAGNOSIS — F5101 Primary insomnia: Secondary | ICD-10-CM

## 2018-05-26 DIAGNOSIS — I10 Essential (primary) hypertension: Secondary | ICD-10-CM

## 2018-05-26 DIAGNOSIS — Z1211 Encounter for screening for malignant neoplasm of colon: Secondary | ICD-10-CM

## 2018-05-26 DIAGNOSIS — R195 Other fecal abnormalities: Secondary | ICD-10-CM

## 2018-05-26 DIAGNOSIS — Z125 Encounter for screening for malignant neoplasm of prostate: Secondary | ICD-10-CM

## 2018-05-26 DIAGNOSIS — I509 Heart failure, unspecified: Secondary | ICD-10-CM

## 2018-05-26 DIAGNOSIS — C83398 Diffuse large b-cell lymphoma of other extranodal and solid organ sites: Secondary | ICD-10-CM

## 2018-05-26 DIAGNOSIS — E785 Hyperlipidemia, unspecified: Secondary | ICD-10-CM

## 2018-05-26 MED ORDER — LOSARTAN POTASSIUM 25 MG PO TABS: 25.0000 mg | ORAL_TABLET | Freq: Every day | ORAL | 5 refills | Status: DC

## 2018-05-26 MED ORDER — DULOXETINE HCL 60 MG PO CPEP: 60.0000 mg | ORAL_CAPSULE | Freq: Every day | ORAL | 5 refills | Status: DC

## 2018-05-26 MED ORDER — TRAZODONE HCL 100 MG PO TABS: 100.0000 mg | ORAL_TABLET | Freq: Every day | ORAL | 5 refills | Status: DC

## 2018-05-26 MED ORDER — ZOLPIDEM TARTRATE 5 MG PO TABS: 5.0000 mg | ORAL_TABLET | Freq: Every evening | ORAL | 3 refills | Status: DC | PRN

## 2018-05-26 MED ORDER — CARVEDILOL 3.125 MG PO TABS: 3.1250 mg | ORAL_TABLET | Freq: Two times a day (BID) | ORAL | 5 refills | Status: DC

## 2018-05-26 MED ORDER — CLONAZEPAM 0.5 MG PO TABS: 0.5000 mg | ORAL_TABLET | Freq: Two times a day (BID) | ORAL | 3 refills | Status: DC

## 2018-05-26 MED ORDER — ATORVASTATIN CALCIUM 40 MG PO TABS
40.0000 mg | ORAL_TABLET | Freq: Every day | ORAL | 5 refills | Status: DC
Start: 2018-05-26 — End: 2018-11-26

## 2018-05-26 NOTE — Patient Instructions (Signed)
   Your doctor has prescribed Cologuard, an easy-to-use, noninvasive test for colon cancer screening, based on the latest advances in stool DNA science.   Here's what will happen next:  1. You may receive a call or email from [facility] to confirm your mailing address and insurance information 2. Your kit will be shipped directly to you 3. You collet your stool sample in the privacy of your own home 4. You return the kit via UPS prepaid shipping or pick-up, in the same box it arrived in 5. You doctor will contact you with the results once they are available  Screening for colon cancer is very important to your good health, so if you have any questions at all, please call [facility] Customer Support Specialists at [phone]. They are available 24 hours a day, 6 days a week.    

## 2018-05-26 NOTE — Progress Notes (Signed)
Subjective:    Patient ID: Matthew Irwin, male    DOB: [birth year], 61 y.o.   MRN: [id number]   Chief Complaint: Medical Management of chronic issues  HPI:  1. Essential hypertension  No c/o chest pain, or headache. Does not check blood pressure at home. BP Readings from Last 3 Encounters:  11/25/17 (!) 146/84  11/19/17 134/60  09/26/17 127/87     2. Chronic congestive heart failure, unspecified heart failure type ([place]) Has occasional SOB when it is really hot outside. But for the most part is doing well.  3. Severe obesity (BMI >= 40) (HCC)  Weight is down 8 lbs  4. Primary insomnia  Does not sleep at all if does not take ambien  5. Hyperlipidemia with target LDL less than 100  Does not watch diet at all and does no designated exercise  6. GAD (generalized anxiety disorder)  Takes klonopin bid for anxiety. Has been anxious every since he had cancer years ago.  7. 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 They recommended yearly physical but no follow up imaging needed unless warranted.    Outpatient Encounter Medications as of 05/26/2018  Medication Sig  . aspirin 81 MG tablet Take 81 mg by mouth at bedtime.   Marland Kitchen atorvastatin (LIPITOR) 40 MG tablet Take 1 tablet (40 mg total) by mouth daily at 6 PM.  . carvedilol (COREG) 3.125 MG tablet Take 1 tablet (3.125 mg total) by mouth 2 (two) times daily with a meal.  . cetirizine (ZYRTEC) 10 MG tablet TAKE 1 TABLET DAILY  . clonazePAM (KLONOPIN) 0.5 MG tablet Take 1 tablet (0.5 mg total) by mouth 2 (two) times daily.  . DULoxetine (CYMBALTA) 60 MG capsule Take 1 capsule (60 mg total) by mouth daily.  Marland Kitchen escitalopram (LEXAPRO) 20 MG tablet Take 1 tablet (20 mg total) by mouth daily.  Marland Kitchen ketotifen (ALAWAY) 0.025 % ophthalmic solution Place 2 drops into both eyes 2 (two) times daily.  Marland Kitchen losartan (COZAAR) 25 MG tablet Take 1 tablet (25 mg total)  by mouth daily.  . Multiple Vitamins-Minerals (CENTRUM SILVER ULTRA MENS) TABS Take 1 tablet by mouth daily.   . naproxen (NAPROSYN) 500 MG tablet Take 1 tablet (500 mg total) by mouth 2 (two) times daily with a meal.  . Omega-3 Fatty Acids (FISH OIL) 1000 MG CAPS Take 1,000 mg by mouth 2 (two) times daily.   Marland Kitchen Propylene Glycol-Glycerin (SOOTHE) 0.6-0.6 % SOLN Apply 1 drop to eye daily as needed (dry eyes).  . traZODone (DESYREL) 100 MG tablet Take 1 tablet (100 mg total) by mouth at bedtime.  Marland Kitchen zolpidem (AMBIEN) 5 MG tablet TAKE 1 TABLET AT BEDTIME AS NEEDED FOR SLEEP     New complaints: None  today  Social history: Daughter just had their first granddaughter. "Jodi Mourning"   Review of Systems  Constitutional: Negative for activity change and appetite change.  HENT: Negative.   Eyes: Negative for pain.  Respiratory: Negative for shortness of breath.   Cardiovascular: Negative for chest pain, palpitations and leg swelling.  Gastrointestinal: Negative for abdominal pain.  Endocrine: Negative for polydipsia.  Genitourinary: Negative.   Skin: Negative for rash.  Neurological: Negative for dizziness, weakness and headaches.  Hematological: Does not bruise/bleed easily.  Psychiatric/Behavioral: Negative.   All other systems reviewed and are negative.      Objective:   Physical Exam  Constitutional: He  is oriented to person, place, and time. He appears well-developed and well-nourished. No distress.  HENT:  Head: Normocephalic.  Nose: Nose normal.  Mouth/Throat: Oropharynx is clear and moist.  Eyes: Pupils are equal, round, and reactive to light. EOM are normal.  Neck: Normal range of motion and phonation normal. Neck supple. No JVD present. Carotid bruit is not present. No thyroid mass and no thyromegaly present.  Cardiovascular: Normal rate and regular rhythm.  Pulmonary/Chest: Effort normal and breath sounds normal. No respiratory distress.  Abdominal: Soft. Normal appearance,  normal aorta and bowel sounds are normal. There is no tenderness.  Musculoskeletal: Normal range of motion.  Lymphadenopathy:    He has no cervical adenopathy.  Neurological: He is alert and oriented to person, place, and time.  Skin: Skin is warm and dry.  Psychiatric: He has a normal mood and affect. His behavior is normal. Judgment and thought content normal.    BP 120/83   Pulse 79   Temp (!) 97.4 F (36.3 C) (Oral)   Ht _0  (1.778 m)   Wt 298 lb (135.2 kg)   BMI 42.76 kg/m        Assessment & Plan:  Matthew Irwin comes in today with chief complaint of Medical Management of Chronic Issues   Diagnosis and orders addressed:  1. Essential hypertension Low sodium diet - CMP14+EGFR - carvedilol (COREG) 3.125 MG tablet; Take 1 tablet (3.125 mg total) by mouth 2 (two) times daily with a meal.  Dispense: 60 tablet; Refill: 5 - losartan (COZAAR) 25 MG tablet; Take 1 tablet (25 mg total) by mouth daily.  Dispense: 30 tablet; Refill: 5  2. Chronic congestive heart failure, unspecified heart failure type ([place])  3. Severe obesity (BMI >= 40) (HCC) Discussed diet and exercise for person with BMI >25 Will recheck weight in 3-6 months  4. Primary insomnia Bedtime routine - zolpidem (AMBIEN) 5 MG tablet; Take 1 tablet (5 mg total) by mouth at bedtime as needed. for sleep  Dispense: 30 tablet; Refill: 3 - traZODone (DESYREL) 100 MG tablet; Take 1 tablet (100 mg total) by mouth at bedtime.  Dispense: 30 tablet; Refill: 5  5. Hyperlipidemia with target LDL less than 100 Low fat diet - Lipid panel - atorvastatin (LIPITOR) 40 MG tablet; Take 1 tablet (40 mg total) by mouth daily at 6 PM.  Dispense: 30 tablet; Refill: 5  6. GAD (generalized anxiety disorder) Stress management - clonazePAM (KLONOPIN) 0.5 MG tablet; Take 1 tablet (0.5 mg total) by mouth 2 (two) times daily.  Dispense: 60 tablet; Refill: 3  7. 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 Encounter for colorectal cancer screening - Cologuard  9. Prostate cancer screening - PSA, total and free   Labs pending Health Maintenance reviewed Diet and exercise encouraged  Follow up plan: 6 months   [place], FNP

## 2018-05-27 LAB — LIPID PANEL
Chol/HDL Ratio: 4.2 ratio (ref 0.0–5.0)
Cholesterol, Total: 156 mg/dL (ref 100–199)
HDL: 37 mg/dL — AB (ref >39)
LDL CALC: 83 mg/dL (ref 0–99)
Triglycerides: 182 mg/dL — ABNORMAL HIGH (ref 0–149)
VLDL CHOLESTEROL CAL: 36 mg/dL (ref 5–40)

## 2018-05-27 LAB — LACTATE DEHYDROGENASE: LDH: 171 IU/L (ref 121–224)

## 2018-05-27 LAB — CMP14+EGFR
ALT: 52 IU/L — AB (ref 0–44)
AST: 43 IU/L — ABNORMAL HIGH (ref 0–40)
Albumin/Globulin Ratio: 1.4 (ref 1.2–2.2)
Albumin: 4.3 g/dL (ref 3.6–4.8)
Alkaline Phosphatase: 81 IU/L (ref 39–117)
BILIRUBIN TOTAL: 0.6 mg/dL (ref 0.0–1.2)
BUN / CREAT RATIO: 14 (ref 10–24)
BUN: 13 mg/dL (ref 8–27)
CALCIUM: 9.6 mg/dL (ref 8.6–10.2)
CHLORIDE: 100 mmol/L (ref 96–106)
CO2: 26 mmol/L (ref 20–29)
CREATININE: 0.94 mg/dL (ref 0.76–1.27)
GFR calc non Af Amer: 87 mL/min/{1.73_m2} (ref >59)
GFR, EST AFRICAN AMERICAN: 101 mL/min/{1.73_m2} (ref >59)
GLUCOSE: 112 mg/dL — AB (ref 65–99)
Globulin, Total: 3 g/dL (ref 1.5–4.5)
Potassium: 4.6 mmol/L (ref 3.5–5.2)
Sodium: 141 mmol/L (ref 134–144)
TOTAL PROTEIN: 7.3 g/dL (ref 6.0–8.5)

## 2018-05-27 LAB — PSA, TOTAL AND FREE
PSA FREE: 0.12 ng/mL
PSA, Free Pct: 30 %
Prostate Specific Ag, Serum: 0.4 ng/mL (ref 0.0–4.0)

## 2018-06-19 ENCOUNTER — Other Ambulatory Visit: Payer: Self-pay | Admitting: Nurse Practitioner

## 2018-06-19 DIAGNOSIS — Z9109 Other allergy status, other than to drugs and biological substances: Secondary | ICD-10-CM

## 2018-06-30 ENCOUNTER — Ambulatory Visit: Payer: PRIVATE HEALTH INSURANCE | Admitting: Family Medicine

## 2018-06-30 ENCOUNTER — Encounter: Payer: Self-pay | Admitting: Family Medicine

## 2018-06-30 VITALS — BP 125/81 | HR 76 | Temp 98.3°F | Ht 70.0 in | Wt 299.0 lb

## 2018-06-30 DIAGNOSIS — J069 Acute upper respiratory infection, unspecified: Secondary | ICD-10-CM | POA: Diagnosis not present

## 2018-06-30 MED ORDER — FLUTICASONE PROPIONATE 50 MCG/ACT NA SUSP: 2.0000 | Freq: Every day | NASAL | 6 refills | Status: AC

## 2018-06-30 NOTE — Patient Instructions (Signed)
It appears that you have a viral upper respiratory infection (cold).  Cold symptoms can last up to 2 weeks.  I recommend that you only use cold medications that are safe in high blood pressure like Coricidin (generic is fine).  Other cold medications can increase your blood pressure.  I have sent in Flonase for you to use in addition to your Zyrtec.  If you develop any other worsening symptoms or signs we discussed, please contact the office and I will send an antibiotic.    - Get plenty of rest and drink plenty of fluids. - Try to breathe moist air. Use a cold mist humidifier. - Consume warm fluids (soup or tea) to provide relief for a stuffy nose and to loosen phlegm. - For nasal stuffiness, try saline nasal spray or a Neti Pot.  Afrin nasal spray can also be used but this product should not be used longer than 3 days or it will cause rebound nasal stuffiness (worsening nasal congestion). - For sore throat pain relief: use chloraseptic spray, suck on throat lozenges, hard candy or popsicles; gargle with warm salt water (1/4 tsp. salt per 8 oz. of water); and eat soft, bland foods. - Eat a well-balanced diet. If you cannot, ensure you are getting enough nutrients by taking a daily multivitamin. - Avoid dairy products, as they can thicken phlegm. - Avoid alcohol, as it impairs your body's immune system.  CONTACT YOUR DOCTOR IF YOU EXPERIENCE ANY OF THE FOLLOWING: - High fever - Ear pain - Sinus-type headache - Unusually severe cold symptoms - Cough that gets worse while other cold symptoms improve - Flare up of any chronic lung problem, such as asthma - Your symptoms persist longer than 2 weeks

## 2018-06-30 NOTE — Progress Notes (Signed)
Subjective: CC: URI PCP: [name], FNP Matthew Irwin is a 62 y.o. male presenting to clinic today for:  1. Matthew Irwin Patient reports a 2 to 3-day history of rhinorrhea, postnasal drip and associated sore throat.  He denies any fevers, chills, nausea, vomiting, headache.  He has had some dry cough.  No hemoptysis or shortness of breath.  No wheezes.  No purulent discharge from the nares.  He is used nothing for symptoms.  His wife is sick with similar.   ROS: Per HPI  Allergies  Allergen Reactions  . Penicillins Hives    Has patient had a PCN reaction causing immediate rash, facial/tongue/throat swelling, SOB or lightheadedness with hypotension: No Has patient had a PCN reaction causing severe rash involving mucus membranes or skin necrosis: Yes Has patient had a PCN reaction that required hospitalization No Has patient had a PCN reaction occurring within the last 10 years: No If all of the above answers are "NO", then may proceed with Cephalosporin use.   . Simvastatin     REACTION: myalgia   Past Medical History:  Diagnosis Date  . ACEI/ARB contraindicated   . Anxiety   . Bronchitis   . Cardiomyopathy   . Chest pain    left heart catherization  . CHF (congestive heart failure) ([place])   . History of cardiac catheterization   . HTN (hypertension)   . Hyperlipidemia    myalgias with pravastatin and simvastatin.  Marland Kitchen NHL (non-Hodgkin's lymphoma) ([place])    history of  . Obesity 08/16/2011  . Sinus infection   . Subacute effusive constrictive pericarditis     Current Outpatient Medications:  .  aspirin 81 MG tablet, Take 81 mg by mouth at bedtime. , Disp: , Rfl:  .  atorvastatin (LIPITOR) 40 MG tablet, Take 1 tablet (40 mg total) by mouth daily at 6 PM., Disp: 30 tablet, Rfl: 5 .  carvedilol (COREG) 3.125 MG tablet, Take 1 tablet (3.125 mg total) by mouth 2 (two) times daily with a meal., Disp: 60 tablet, Rfl: 5 .  cetirizine (ZYRTEC) 10 MG tablet, TAKE 1 TABLET  DAILY, Disp: 30 tablet, Rfl: 5 .  clonazePAM (KLONOPIN) 0.5 MG tablet, Take 1 tablet (0.5 mg total) by mouth 2 (two) times daily., Disp: 60 tablet, Rfl: 3 .  DULoxetine (CYMBALTA) 60 MG capsule, Take 1 capsule (60 mg total) by mouth daily., Disp: 30 capsule, Rfl: 5 .  ketotifen (ALAWAY) 0.025 % ophthalmic solution, Place 2 drops into both eyes 2 (two) times daily., Disp: , Rfl:  .  losartan (COZAAR) 25 MG tablet, Take 1 tablet (25 mg total) by mouth daily., Disp: 30 tablet, Rfl: 5 .  Multiple Vitamins-Minerals (CENTRUM SILVER ULTRA MENS) TABS, Take 1 tablet by mouth daily. , Disp: , Rfl:  .  Omega-3 Fatty Acids (FISH OIL) 1000 MG CAPS, Take 1,000 mg by mouth 2 (two) times daily. , Disp: , Rfl:  .  Propylene Glycol-Glycerin (SOOTHE) 0.6-0.6 % SOLN, Apply 1 drop to eye daily as needed (dry eyes)., Disp: , Rfl:  .  traZODone (DESYREL) 100 MG tablet, Take 1 tablet (100 mg total) by mouth at bedtime., Disp: 30 tablet, Rfl: 5 .  zolpidem (AMBIEN) 5 MG tablet, Take 1 tablet (5 mg total) by mouth at bedtime as needed. for sleep, Disp: 30 tablet, Rfl: 3 Social History   Socioeconomic History  . Marital status: Married    Spouse name: Not on file  . Number of children: Not on file  .  Years of education: Not on file  . Highest education level: Not on file  Occupational History  . Not on file  Social Needs  . Financial resource strain: Not on file  . Food insecurity:    Worry: Not on file    Inability: Not on file  . Transportation needs:    Medical: Not on file    Non-medical: Not on file  Tobacco Use  . Smoking status: Former Research scientist (life sciences)  . Smokeless tobacco: Never Used  . Tobacco comment: Quit tobacco in 1999 after 35 pyr hx  Substance and Sexual Activity  . Alcohol use: No  . Drug use: No  . Sexual activity: Yes  Lifestyle  . Physical activity:    Days per week: Not on file    Minutes per session: Not on file  . Stress: Not on file  Relationships  . Social connections:    Talks on  phone: Not on file    Gets together: Not on file    Attends religious service: Not on file    Active member of club or organization: Not on file    Attends meetings of clubs or organizations: Not on file    Relationship status: Not on file  . Intimate partner violence:    Fear of current or ex partner: Not on file    Emotionally abused: Not on file    Physically abused: Not on file    Forced sexual activity: Not on file  Other Topics Concern  . Not on file  Social History Narrative   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  Chemotherapy, including Adriamycin, completed 3/10.  Radiation to chest/back in early 2010.  PET scan 11/10 with no evidence for recurrent lymphoma.       2. Chest pain: Left heart cath (10/10) with no angiographic coronary disease.      3.Cardiomyopathy: Nonischemic.  EF 35-40% with global hypokinesis by LV-gram 10/10.  Cardiac MRI (10/10) was limited by respiratory artifact but showed a small to moderate free-flowing pericardial effusion with no tamponade, EF 43% with global hypokinesis, no myocardial delayed enhancement (no definite evidence of myocardial infarction, infiltrative disease, or myocarditis). Echo (11/10) with EF 40-45%.  Cardiomyopathy may be secondary to Adriamycin toxicity.  Echo after pericardiectomy in 2023/10/23) showed EF 55%.      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  Had free-flowing effusion in 10/10 with no tamponade.  Patient was reimaged by echo in 11/10 showed EF 40-45% (global HK), dilated IVC, and evidence for ventricular interdependence with an organized pericardial effusion.  Hemodynamic right and left heart cath showed mean RA 14, RV 27/18, PA 29/17, mean PCWP 14, LVEDP 18, cardiac index 1.8 => there was equalization of diastolic pressure.  Also, ventricular interdependence was demonstrated by cath.  Patient underwent pericardiectomy in 11/10.  Echo  after pericardiectomy 23-Oct-2023) showed EF 55%, moderate diastolic dysfunction, RV moderately dilated and moderately dysfunctional.  There were still some signs of constrictive physiology (respirophasic interventricular septal variation) but the IVC was small with inspiratory collapse.   Repeat echo 6/11 showed EF 50%, normal RV size and systolic function (improved from October 23, 2023), some septal shift with inspiration but less pronounced than in 10-23-23, and normal IVC size.       FH-No premature CAD   Positive for HTN, DM.    Sister with Turner's Syndrome         Family History  Problem Relation  Age of Onset  . Parkinson's disease Mother   . Turner syndrome Sister        questionable  . Colon cancer Neg Hx     Objective: Office vital signs reviewed. BP 125/81   Pulse 76   Temp 98.3 F (36.8 C) (Oral)   Ht 5\' 10"  (1.778 m)   Wt 299 lb (135.6 kg)   BMI 42.90 kg/m   Physical Examination:  General: Awake, alert, well nourished, nontoxic. No acute distress HEENT: Normal; no sinus TTP    Neck: No masses palpated. No lymphadenopathy    Ears: Tympanic membranes intact, normal light reflex, no erythema, no bulging    Eyes: PERRLA, extraocular membranes intact, sclera white    Nose: nasal turbinates moist, clear nasal discharge    Throat: moist mucus membranes, no erythema, no tonsillar exudate.  Airway is patent Cardio: regular rate and rhythm, S1S2 heard, no murmurs appreciated Pulm: clear to auscultation bilaterally, no wheezes, rhonchi or rales; normal work of breathing on room air  Assessment/ Plan: 62 y.o. male   1. Viral URI Patient is afebrile nontoxic-appearing.  He has normal vital signs.  His physical exam is unremarkable.  Likely viral URI.  I recommended that he continue Zyrtec daily.  Add Flonase.  Sore throat likely related to postnasal drip.  Salt water gargles recommended.  Okay to use medications for cold that are safe with high blood pressure.  Home care instructions  reviewed.  Handout provided.  Reasons for return discussed.  If symptoms worsen or last longer than expected, patient to call the office and I will place an order for an antibiotic.   Meds ordered this encounter  Medications  . fluticasone (FLONASE) 50 MCG/ACT nasal spray    Sig: Place 2 sprays into both nostrils daily.    Dispense:  16 g    Refill:  [place], [place] [phone]

## 2018-07-04 LAB — COLOGUARD: COLOGUARD: POSITIVE

## 2018-07-29 NOTE — Addendum Note (Signed)
Addended by: [name] on: 07/29/2018 04:36 PM   Modules accepted: Orders

## 2018-07-30 ENCOUNTER — Encounter: Payer: Self-pay | Admitting: Internal Medicine

## 2018-09-14 ENCOUNTER — Encounter: Payer: No Typology Code available for payment source | Admitting: Internal Medicine

## 2018-11-26 ENCOUNTER — Ambulatory Visit: Payer: PRIVATE HEALTH INSURANCE | Admitting: Nurse Practitioner

## 2018-11-26 ENCOUNTER — Encounter: Payer: Self-pay | Admitting: Nurse Practitioner

## 2018-11-26 VITALS — BP 132/87 | HR 83 | Temp 97.1°F | Ht 70.0 in | Wt 299.0 lb

## 2018-11-26 DIAGNOSIS — F411 Generalized anxiety disorder: Secondary | ICD-10-CM

## 2018-11-26 DIAGNOSIS — I509 Heart failure, unspecified: Secondary | ICD-10-CM | POA: Diagnosis not present

## 2018-11-26 DIAGNOSIS — F5101 Primary insomnia: Secondary | ICD-10-CM

## 2018-11-26 DIAGNOSIS — C83398 Diffuse large b-cell lymphoma of other extranodal and solid organ sites: Secondary | ICD-10-CM

## 2018-11-26 DIAGNOSIS — C8339 Diffuse large B-cell lymphoma, extranodal and solid organ sites: Secondary | ICD-10-CM

## 2018-11-26 DIAGNOSIS — I10 Essential (primary) hypertension: Secondary | ICD-10-CM

## 2018-11-26 DIAGNOSIS — E785 Hyperlipidemia, unspecified: Secondary | ICD-10-CM | POA: Diagnosis not present

## 2018-11-26 MED ORDER — DULOXETINE HCL 60 MG PO CPEP: 60.0000 mg | ORAL_CAPSULE | Freq: Every day | ORAL | 5 refills | Status: DC

## 2018-11-26 MED ORDER — CLONAZEPAM 0.5 MG PO TABS: 0.5000 mg | ORAL_TABLET | Freq: Two times a day (BID) | ORAL | 3 refills | Status: DC

## 2018-11-26 MED ORDER — ZOLPIDEM TARTRATE 5 MG PO TABS: 5.0000 mg | ORAL_TABLET | Freq: Every evening | ORAL | 3 refills | Status: DC | PRN

## 2018-11-26 MED ORDER — LOSARTAN POTASSIUM 25 MG PO TABS: 25.0000 mg | ORAL_TABLET | Freq: Every day | ORAL | 5 refills | Status: DC

## 2018-11-26 MED ORDER — CARVEDILOL 3.125 MG PO TABS: 3.1250 mg | ORAL_TABLET | Freq: Two times a day (BID) | ORAL | 5 refills | Status: DC

## 2018-11-26 MED ORDER — ATORVASTATIN CALCIUM 40 MG PO TABS: 40.0000 mg | ORAL_TABLET | Freq: Every day | ORAL | 5 refills | Status: DC

## 2018-11-26 MED ORDER — TRAZODONE HCL 100 MG PO TABS: 100.0000 mg | ORAL_TABLET | Freq: Every day | ORAL | 5 refills | Status: DC

## 2018-11-26 NOTE — Progress Notes (Signed)
Subjective:    Patient ID: Matthew Irwin, male    DOB: [birth year], 63 y.o.   MRN: [id number]   Chief Complaint: medical management of chronic issues  HPI:  1. Essential hypertension  No c/o chest pain, sob or headache. Does  Not check blood pressure at home. BP Readings from Last 3 Encounters:  06/30/18 125/81  05/26/18 120/83  11/25/17 (!) 146/84     2. Chronic congestive heart failure, unspecified heart failure type [facility]) Last saw cardiologist on    3. Hyperlipidemia with target LDL less than 100  Does not watch diet and does no exercise  4. GAD (generalized anxiety disorder)  He takes klonopin 0.5mg  bid- he has been taking this since his cancer dx several years ago.  5. 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 He "graduated from cancer center and no longer has to go for any follow ups.  6. Primary insomnia  He takes [place] and trazodone at night to sleep. Says that he cannot sleep if he does not take both medications. Snores bad at night . He says he wants to sleep during the day a lot.  7. Severe obesity (BMI >= 40) (HCC)  No recent weight changes.    Outpatient Encounter Medications as of 11/26/2018  Medication Sig  . aspirin 81 MG tablet Take 81 mg by mouth at bedtime.   Marland Kitchen atorvastatin (LIPITOR) 40 MG tablet Take 1 tablet (40 mg total) by mouth daily at 6 PM.  . carvedilol (COREG) 3.125 MG tablet Take 1 tablet (3.125 mg total) by mouth 2 (two) times daily with a meal.  . cetirizine (ZYRTEC) 10 MG tablet TAKE 1 TABLET DAILY  . clonazePAM (KLONOPIN) 0.5 MG tablet Take 1 tablet (0.5 mg total) by mouth 2 (two) times daily.  . DULoxetine (CYMBALTA) 60 MG capsule Take 1 capsule (60 mg total) by mouth daily.  . fluticasone (FLONASE) 50 MCG/ACT nasal spray Place 2 sprays into both nostrils daily.  Marland Kitchen ketotifen (ALAWAY) 0.025 % ophthalmic solution Place 2 drops into both eyes 2 (two) times daily.  Marland Kitchen losartan (COZAAR) 25  MG tablet Take 1 tablet (25 mg total) by mouth daily.  . Multiple Vitamins-Minerals (CENTRUM SILVER ULTRA MENS) TABS Take 1 tablet by mouth daily.   . Omega-3 Fatty Acids (FISH OIL) 1000 MG CAPS Take 1,000 mg by mouth 2 (two) times daily.   Marland Kitchen Propylene Glycol-Glycerin (SOOTHE) 0.6-0.6 % SOLN Apply 1 drop to eye daily as needed (dry eyes).  . traZODone (DESYREL) 100 MG tablet Take 1 tablet (100 mg total) by mouth at bedtime.  Marland Kitchen zolpidem (AMBIEN) 5 MG tablet Take 1 tablet (5 mg total) by mouth at bedtime as needed. for sleep     New complaints: None today  Social history: Helps keep his new granddaughter.    Review of Systems  Constitutional: Negative for activity change and appetite change.  HENT: Negative.   Eyes: Negative for pain.  Respiratory: Negative for shortness of breath.   Cardiovascular: Negative for chest pain, palpitations and leg swelling.  Gastrointestinal: Negative for abdominal pain.  Endocrine: Negative for polydipsia.  Genitourinary: Negative.   Skin: Negative for rash.  Neurological: Negative for dizziness, weakness and headaches.  Hematological: Does not bruise/bleed easily.  Psychiatric/Behavioral: Negative.   All other systems reviewed and are negative.      Objective:   Physical Exam Vitals signs and nursing note reviewed.  Constitutional:  Appearance: Normal appearance. He is well-developed.  HENT:     Head: Normocephalic.     Nose: Nose normal.  Eyes:     Pupils: Pupils are equal, round, and reactive to light.  Neck:     Musculoskeletal: Normal range of motion and neck supple.     Thyroid: No thyroid mass or thyromegaly.     Vascular: No carotid bruit or JVD.     Trachea: Phonation normal.  Cardiovascular:     Rate and Rhythm: Normal rate and regular rhythm.  Pulmonary:     Effort: Pulmonary effort is normal. No respiratory distress.     Breath sounds: Normal breath sounds.  Abdominal:     General: Bowel sounds are normal.      Palpations: Abdomen is soft.     Tenderness: There is no abdominal tenderness.  Musculoskeletal: Normal range of motion.  Lymphadenopathy:     Cervical: No cervical adenopathy.  Skin:    General: Skin is warm and dry.  Neurological:     Mental Status: He is alert and oriented to person, place, and time.  Psychiatric:        Behavior: Behavior normal.        Thought Content: Thought content normal.        Judgment: Judgment normal.    BP 132/87   Pulse 83   Temp (!) 97.1 F (36.2 C) (Oral)   Ht 5\' 10"  (1.778 m)   Wt 299 lb (135.6 kg)   BMI 42.90 kg/m         Assessment & Plan:  Matthew Irwin comes in today with chief complaint of Medical Management of Chronic Issues   Diagnosis and orders addressed:  1. Essential hypertension Low sodium diet - carvedilol (COREG) 3.125 MG tablet; Take 1 tablet (3.125 mg total) by mouth 2 (two) times daily with a meal.  Dispense: 60 tablet; Refill: 5 - losartan (COZAAR) 25 MG tablet; Take 1 tablet (25 mg total) by mouth daily.  Dispense: 30 tablet; Refill: 5  2. Chronic congestive heart failure, unspecified heart failure type [facility]) Will follow up with cardiology when he gets his new insurance  3. Hyperlipidemia with target LDL less than 100 Low fat diet - atorvastatin (LIPITOR) 40 MG tablet; Take 1 tablet (40 mg total) by mouth daily at 6 PM.  Dispense: 30 tablet; Refill: 5  4. GAD (generalized anxiety disorder) Stress management - clonazePAM (KLONOPIN) 0.5 MG tablet; Take 1 tablet (0.5 mg total) by mouth 2 (two) times daily.  Dispense: 60 tablet; Refill: 3 - DULoxetine (CYMBALTA) 60 MG capsule; Take 1 capsule (60 mg total) by mouth daily.  Dispense: 30 capsule; Refill: 5  5. 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 Primary insomnia Bedtime routine - zolpidem (AMBIEN) 5 MG tablet; Take 1 tablet (5 mg total) by mouth at bedtime as needed. for sleep  Dispense: 30 tablet; Refill: 3 - traZODone (DESYREL)  100 MG tablet; Take 1 tablet (100 mg total) by mouth at bedtime.  Dispense: 30 tablet; Refill: 5  7. Severe obesity (BMI >= 40) (HCC) Discussed diet and exercise for person with BMI >25 Will recheck weight in 3-6 months   Labs pending Health Maintenance reviewed Diet and exercise encouraged  Follow up plan: 3 months   Mary-Margaret Hassell Done, FNP

## 2018-11-26 NOTE — Addendum Note (Signed)
Addended by: Rolena Infante on: 11/26/2018 02:18 PM   Modules accepted: Orders

## 2018-11-26 NOTE — Patient Instructions (Signed)

## 2018-11-27 LAB — CMP14+EGFR
ALT: 54 IU/L — ABNORMAL HIGH (ref 0–44)
AST: 46 IU/L — ABNORMAL HIGH (ref 0–40)
Albumin/Globulin Ratio: 1.4 (ref 1.2–2.2)
Albumin: 4.2 g/dL (ref 3.8–4.8)
Alkaline Phosphatase: 82 IU/L (ref 39–117)
BUN/Creatinine Ratio: 11 (ref 10–24)
BUN: 10 mg/dL (ref 8–27)
Bilirubin Total: 0.7 mg/dL (ref 0.0–1.2)
CO2: 28 mmol/L (ref 20–29)
Calcium: 9.4 mg/dL (ref 8.6–10.2)
Chloride: 100 mmol/L (ref 96–106)
Creatinine, Ser: 0.95 mg/dL (ref 0.76–1.27)
GFR calc Af Amer: 99 mL/min/{1.73_m2} (ref >59)
GFR calc non Af Amer: 86 mL/min/{1.73_m2} (ref >59)
Globulin, Total: 3 g/dL (ref 1.5–4.5)
Glucose: 104 mg/dL — ABNORMAL HIGH (ref 65–99)
Potassium: 4.2 mmol/L (ref 3.5–5.2)
Sodium: 140 mmol/L (ref 134–144)
Total Protein: 7.2 g/dL (ref 6.0–8.5)

## 2018-11-27 LAB — LIPID PANEL
Chol/HDL Ratio: 3.4 ratio (ref 0.0–5.0)
Cholesterol, Total: 134 mg/dL (ref 100–199)
HDL: 39 mg/dL — ABNORMAL LOW (ref >39)
LDL Calculated: 61 mg/dL (ref 0–99)
Triglycerides: 171 mg/dL — ABNORMAL HIGH (ref 0–149)
VLDL Cholesterol Cal: 34 mg/dL (ref 5–40)

## 2018-11-27 LAB — VITAMIN B12: Vitamin B-12: 1506 pg/mL — ABNORMAL HIGH (ref 232–1245)

## 2018-11-28 LAB — LACTATE DEHYDROGENASE: LDH: 169 IU/L (ref 121–224)

## 2018-11-28 LAB — SPECIMEN STATUS REPORT

## 2019-02-11 ENCOUNTER — Other Ambulatory Visit: Payer: Self-pay | Admitting: Nurse Practitioner

## 2019-02-11 DIAGNOSIS — Z9109 Other allergy status, other than to drugs and biological substances: Secondary | ICD-10-CM

## 2019-03-02 ENCOUNTER — Other Ambulatory Visit: Payer: Self-pay

## 2019-03-02 ENCOUNTER — Encounter: Payer: Self-pay | Admitting: Nurse Practitioner

## 2019-03-02 ENCOUNTER — Ambulatory Visit (INDEPENDENT_AMBULATORY_CARE_PROVIDER_SITE_OTHER): Payer: PRIVATE HEALTH INSURANCE | Admitting: Nurse Practitioner

## 2019-03-02 DIAGNOSIS — E785 Hyperlipidemia, unspecified: Secondary | ICD-10-CM

## 2019-03-02 DIAGNOSIS — F411 Generalized anxiety disorder: Secondary | ICD-10-CM

## 2019-03-02 DIAGNOSIS — I509 Heart failure, unspecified: Secondary | ICD-10-CM | POA: Diagnosis not present

## 2019-03-02 DIAGNOSIS — I10 Essential (primary) hypertension: Secondary | ICD-10-CM

## 2019-03-02 DIAGNOSIS — C8339 Diffuse large B-cell lymphoma, extranodal and solid organ sites: Secondary | ICD-10-CM

## 2019-03-02 DIAGNOSIS — F5101 Primary insomnia: Secondary | ICD-10-CM

## 2019-03-02 MED ORDER — ZOLPIDEM TARTRATE 5 MG PO TABS: 5.0000 mg | ORAL_TABLET | Freq: Every evening | ORAL | 3 refills | Status: DC | PRN

## 2019-03-02 MED ORDER — ATORVASTATIN CALCIUM 40 MG PO TABS: 40.0000 mg | ORAL_TABLET | Freq: Every day | ORAL | 5 refills | Status: DC

## 2019-03-02 MED ORDER — DULOXETINE HCL 60 MG PO CPEP: 60.0000 mg | ORAL_CAPSULE | Freq: Every day | ORAL | 1 refills | Status: DC

## 2019-03-02 MED ORDER — CLONAZEPAM 0.5 MG PO TABS: 0.5000 mg | ORAL_TABLET | Freq: Two times a day (BID) | ORAL | 3 refills | Status: DC

## 2019-03-02 MED ORDER — TRAZODONE HCL 50 MG PO TABS: 25.0000 mg | ORAL_TABLET | Freq: Every evening | ORAL | 1 refills | Status: DC | PRN

## 2019-03-02 MED ORDER — LOSARTAN POTASSIUM 25 MG PO TABS: 25.0000 mg | ORAL_TABLET | Freq: Every day | ORAL | 1 refills | Status: DC

## 2019-03-02 MED ORDER — CARVEDILOL 3.125 MG PO TABS: 3.1250 mg | ORAL_TABLET | Freq: Two times a day (BID) | ORAL | 1 refills | Status: DC

## 2019-03-02 NOTE — Progress Notes (Signed)
Virtual Visit via telephone Note  I connected with Matthew Irwin on 03/02/19 at 8:30 by telephone and verified that I am speaking with the correct person using two identifiers. Matthew Irwin is currently located at home and no one is currently with her during visit. The provider, Mary-Margaret Hassell Done, FNP is located in their office at time of visit.  I discussed the limitations, risks, security and privacy concerns of performing an evaluation and management service by telephone and the availability of in person appointments. I also discussed with the patient that there may be a patient responsible charge related to this service. The patient expressed understanding and agreed to proceed.   History and Present Illness:   Chief Complaint: medical manangement of chronic issues   HPI:  1. Essential hypertension No c/o chest pain, sob or headache. Does not check blood pressure at home. BP Readings from Last 3 Encounters:  11/26/18 132/87  06/30/18 125/81  05/26/18 120/83    2. Hyperlipidemia with target LDL less than 100 Does not watch diet and does no exercsie  3. Chronic congestive heart failure, unspecified heart failure type ([place]) Has not seen cardiology in several years  4. 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  5. Primary insomnia Is on ambien nightly with trazadone 1/2 tablet and works well to help him sleep  6. 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 He was released from their services.  7. Severe obesity (BMI >= 40) (HCC) No weight changes    Outpatient Encounter Medications as of 03/02/2019  Medication Sig  . aspirin 81 MG tablet Take 81 mg by mouth at bedtime.   Marland Kitchen atorvastatin (LIPITOR) 40 MG tablet Take 1 tablet (40 mg total) by mouth daily at 6 PM.  . carvedilol (COREG) 3.125 MG tablet Take 1 tablet (3.125 mg total) by mouth 2  (two) times daily with a meal.  . cetirizine (ZYRTEC) 10 MG tablet TAKE 1 TABLET DAILY  . clonazePAM (KLONOPIN) 0.5 MG tablet Take 1 tablet (0.5 mg total) by mouth 2 (two) times daily.  . DULoxetine (CYMBALTA) 60 MG capsule Take 1 capsule (60 mg total) by mouth daily.  . fluticasone (FLONASE) 50 MCG/ACT nasal spray Place 2 sprays into both nostrils daily.  Marland Kitchen ketotifen (ALAWAY) 0.025 % ophthalmic solution Place 2 drops into both eyes 2 (two) times daily.  Marland Kitchen losartan (COZAAR) 25 MG tablet Take 1 tablet (25 mg total) by mouth daily.  . Multiple Vitamins-Minerals (CENTRUM SILVER ULTRA MENS) TABS Take 1 tablet by mouth daily.   . Omega-3 Fatty Acids (FISH OIL) 1000 MG CAPS Take 1,000 mg by mouth 2 (two) times daily.   Marland Kitchen Propylene Glycol-Glycerin (SOOTHE) 0.6-0.6 % SOLN Apply 1 drop to eye daily as needed (dry eyes).  . traZODone (DESYREL) 100 MG tablet Take 1 tablet (100 mg total) by mouth at bedtime.  Marland Kitchen zolpidem (AMBIEN) 5 MG tablet Take 1 tablet (5 mg total) by mouth at bedtime as needed. for sleep      New complaints: None today  Social history: Lives with wife- helps take care of his granddaughter     Review of Systems  Constitutional: Negative for diaphoresis and weight loss.  Eyes: Negative for blurred vision, double vision and pain.  Respiratory: Negative for shortness of breath.   Cardiovascular: Negative for chest pain, palpitations, orthopnea and leg swelling.  Gastrointestinal: Negative for abdominal pain.  Skin: Negative for rash.  Neurological: Negative for dizziness, sensory change, loss of consciousness, weakness and headaches.  Endo/Heme/Allergies: Negative for polydipsia. Does not bruise/bleed easily.  Psychiatric/Behavioral: Negative for memory loss. The patient does not have insomnia.   All other systems reviewed and are negative.    Observations/Objective: Alert and oriented- answers all questions appropriately No distress.  Assessment and Plan: Matthew Irwin  comes in today with chief complaint of medical management of chronic issues  Diagnosis and orders addressed:  1. Essential hypertension Low sodium diet - carvedilol (COREG) 3.125 MG tablet; Take 1 tablet (3.125 mg total) by mouth 2 (two) times daily with a meal.  Dispense: 180 tablet; Refill: 1 - losartan (COZAAR) 25 MG tablet; Take 1 tablet (25 mg total) by mouth daily.  Dispense: 90 tablet; Refill: 1  2. Hyperlipidemia with target LDL less than 100 Low fat diet - atorvastatin (LIPITOR) 40 MG tablet; Take 1 tablet (40 mg total) by mouth daily at 6 PM.  Dispense: 30 tablet; Refill: 5  3. Chronic congestive heart failure, unspecified heart failure type ([place]) Need to follow up with cardiology yearly  4. GAD (generalized anxiety disorder) Stress management - DULoxetine (CYMBALTA) 60 MG capsule; Take 1 capsule (60 mg total) by mouth daily.  Dispense: 90 capsule; Refill: 1 - clonazePAM (KLONOPIN) 0.5 MG tablet; Take 1 tablet (0.5 mg total) by mouth 2 (two) times daily.  Dispense: 60 tablet; Refill: 3  5. Primary insomnia Bedtime routine - zolpidem (AMBIEN) 5 MG tablet; Take 1 tablet (5 mg total) by mouth at bedtime as needed. for sleep  Dispense: 30 tablet; Refill: 3  6. 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 Severe obesity (BMI >= 40) (HCC) Discussed diet and exercise for person with BMI >25 Will recheck weight in 3-6 months   Labs pending Health Maintenance reviewed Diet and exercise encouraged  Follow up plan: 3 months     I discussed the assessment and treatment plan with the patient. The patient was provided an opportunity to ask questions and all were answered. The patient agreed with the plan and demonstrated an understanding of the instructions.   The patient was advised to call back or seek an in-person evaluation if the symptoms worsen or if the condition fails to improve as anticipated.  The  above assessment and management plan was discussed with the patient. The patient verbalized understanding of and has agreed to the management plan. Patient is aware to call the clinic if symptoms persist or worsen. Patient is aware when to return to the clinic for a follow-up visit. Patient educated on when it is appropriate to go to the emergency department.   Time call ended:  8:50  I provided 20 minutes of non-face-to-face time during this encounter.    Mary-Margaret Hassell Done, FNP

## 2019-05-12 ENCOUNTER — Other Ambulatory Visit: Payer: Self-pay | Admitting: Nurse Practitioner

## 2019-05-12 DIAGNOSIS — Z9109 Other allergy status, other than to drugs and biological substances: Secondary | ICD-10-CM

## 2019-06-04 ENCOUNTER — Ambulatory Visit (INDEPENDENT_AMBULATORY_CARE_PROVIDER_SITE_OTHER): Payer: PRIVATE HEALTH INSURANCE | Admitting: Nurse Practitioner

## 2019-06-04 ENCOUNTER — Encounter: Payer: Self-pay | Admitting: Nurse Practitioner

## 2019-06-04 DIAGNOSIS — I509 Heart failure, unspecified: Secondary | ICD-10-CM

## 2019-06-04 DIAGNOSIS — E785 Hyperlipidemia, unspecified: Secondary | ICD-10-CM | POA: Diagnosis not present

## 2019-06-04 DIAGNOSIS — F5101 Primary insomnia: Secondary | ICD-10-CM

## 2019-06-04 DIAGNOSIS — I10 Essential (primary) hypertension: Secondary | ICD-10-CM | POA: Diagnosis not present

## 2019-06-04 DIAGNOSIS — F411 Generalized anxiety disorder: Secondary | ICD-10-CM

## 2019-06-04 DIAGNOSIS — C8339 Diffuse large B-cell lymphoma, extranodal and solid organ sites: Secondary | ICD-10-CM

## 2019-06-04 MED ORDER — ZOLPIDEM TARTRATE 5 MG PO TABS: 5.0000 mg | ORAL_TABLET | Freq: Every evening | ORAL | 3 refills | Status: DC | PRN

## 2019-06-04 MED ORDER — ATORVASTATIN CALCIUM 40 MG PO TABS: 40.0000 mg | ORAL_TABLET | Freq: Every day | ORAL | 1 refills | Status: DC

## 2019-06-04 MED ORDER — CLONAZEPAM 0.5 MG PO TABS: 0.5000 mg | ORAL_TABLET | Freq: Two times a day (BID) | ORAL | 3 refills | Status: DC

## 2019-06-04 NOTE — Progress Notes (Signed)
Patient ID: Matthew Irwin, male   DOB: [birth year], 63 y.o.   MRN: [id number]    Virtual Visit via telephone Note Due to COVID-19 pandemic this visit was conducted virtually. This visit type was conducted due to national recommendations for restrictions regarding the COVID-19 Pandemic (e.g. social distancing, sheltering in place) in an effort to limit this patient's exposure and mitigate transmission in our community. All issues noted in this document were discussed and addressed.  A physical exam was not performed with this format.  I connected with Matthew Irwin on 06/04/19 at 8:00 by telephone and verified that I am speaking with the correct person using two identifiers. Matthew Irwin is currently located at work and no one is currently with her during visit. The provider, Mary-Margaret Hassell Done, FNP is located in their office at time of visit.  I discussed the limitations, risks, security and privacy concerns of performing an evaluation and management service by telephone and the availability of in person appointments. I also discussed with the patient that there may be a patient responsible charge related to this service. The patient expressed understanding and agreed to proceed.   History and Present Illness:   Chief Complaint: Medical Management of Chronic Issues    HPI:  1. Essential hypertension No c/o chest pain, sob or headache. Does not check blood pressure at home.  BP Readings from Last 3 Encounters:  11/26/18 132/87  06/30/18 125/81  05/26/18 120/83     2. Hyperlipidemia with target LDL less than 100 Does not really watch diet. Does no dedicated exercise. He does tale daily dose of lipitor. Lab Results  Component Value Date   CHOL 134 11/26/2018   HDL 39 (L) 11/26/2018   LDLCALC 61 11/26/2018   TRIG 171 (H) 11/26/2018   CHOLHDL 3.4 11/26/2018     3. Chronic congestive heart failure, unspecified heart failure type ([place]) Has not seen cardiology in several years.  4. GAD  (generalized anxiety disorder) Takes klonopin BID- he has had to be on this since his cancer diagnosis.  5. Primary insomnia Has lots of trouble sleeping. He is on [place] and trazadone and that combination works well for him.  6. 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 According to note, since he has been cancer free for greater then 5 years he is considered graduated from oncology. We will continue to watch him in the PCP setting.  7. Severe obesity (BMI >= 40) (HCC) No recent weight changes.   Outpatient Encounter Medications as of 06/04/2019  Medication Sig  . aspirin 81 MG tablet Take 81 mg by mouth at bedtime.   Marland Kitchen atorvastatin (LIPITOR) 40 MG tablet Take 1 tablet (40 mg total) by mouth daily at 6 PM.  . carvedilol (COREG) 3.125 MG tablet Take 1 tablet (3.125 mg total) by mouth 2 (two) times daily with a meal.  . cetirizine (ZYRTEC) 10 MG tablet TAKE 1 TABLET DAILY  . clonazePAM (KLONOPIN) 0.5 MG tablet Take 1 tablet (0.5 mg total) by mouth 2 (two) times daily.  . DULoxetine (CYMBALTA) 60 MG capsule Take 1 capsule (60 mg total) by mouth daily.  . fluticasone (FLONASE) 50 MCG/ACT nasal spray Place 2 sprays into both nostrils daily.  Marland Kitchen ketotifen (ALAWAY) 0.025 % ophthalmic solution Place 2 drops into both eyes 2 (two) times daily.  Marland Kitchen losartan (COZAAR) 25 MG tablet Take 1 tablet (25 mg total) by mouth daily.  . Multiple Vitamins-Minerals (CENTRUM  SILVER ULTRA MENS) TABS Take 1 tablet by mouth daily.   . Omega-3 Fatty Acids (FISH OIL) 1000 MG CAPS Take 1,000 mg by mouth 2 (two) times daily.   Marland Kitchen Propylene Glycol-Glycerin (SOOTHE) 0.6-0.6 % SOLN Apply 1 drop to eye daily as needed (dry eyes).  . traZODone (DESYREL) 50 MG tablet Take 0.5-1 tablets (25-50 mg total) by mouth at bedtime as needed for sleep.  Marland Kitchen zolpidem (AMBIEN) 5 MG tablet Take 1 tablet (5 mg total) by mouth at bedtime as needed. for sleep     Past Surgical  History:  Procedure Laterality Date  . BACK SURGERY  07/2008   tumor removed off spine  . heart sack removed  07/2009  . HEMORRHOID SURGERY    . LEFT HEART CATH  10/10  . NASAL SINUS SURGERY    . PORT-A-CATH REMOVAL Left 12/13/2016   Procedure: MINOR REMOVAL PORT-A-CATH;  Surgeon: Aviva Signs, MD;  Location: [facility];  Service: General;  Laterality: Left;  . PORTACATH PLACEMENT    . TUMOR REMOVAL      Family History  Problem Relation Age of Onset  . Parkinson's disease Mother   . Turner syndrome Sister        questionable  . Colon cancer Neg Hx     New complaints: None today  Social history: Works as Fish farm manager: will have signed at next visit    Review of Systems  Constitutional: Negative for diaphoresis and weight loss.  Eyes: Negative for blurred vision, double vision and pain.  Respiratory: Negative for shortness of breath.   Cardiovascular: Negative for chest pain, palpitations, orthopnea and leg swelling.  Gastrointestinal: Negative for abdominal pain.  Skin: Negative for rash.  Neurological: Negative for dizziness, sensory change, loss of consciousness, weakness and headaches.  Endo/Heme/Allergies: Negative for polydipsia. Does not bruise/bleed easily.  Psychiatric/Behavioral: Negative for memory loss. The patient does not have insomnia.   All other systems reviewed and are negative.    Observations/Objective: Alert and oriented- answers all questions appropriately No distress.  Assessment and Plan: [name] comes in today with chief complaint of Medical Management of Chronic Issues   Diagnosis and orders addressed:  1. Essential hypertension Low sodium diet  2. Hyperlipidemia with target LDL less than 100 Low fat diet - atorvastatin (LIPITOR) 40 MG tablet; Take 1 tablet (40 mg total) by mouth daily at 6 PM.  Dispense: 90 tablet; Refill: 1  3. Chronic congestive heart failure, unspecified heart failure type ([place])   4. GAD (generalized anxiety disorder) Stress management - clonazePAM (KLONOPIN) 0.5 MG tablet; Take 1 tablet (0.5 mg total) by mouth 2 (two) times daily.  Dispense: 60 tablet; Refill: 3  5. Primary insomnia Bedtime routine - zolpidem (AMBIEN) 5 MG tablet; Take 1 tablet (5 mg total) by mouth at bedtime as needed. for sleep  Dispense: 30 tablet; Refill: 3  6. 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 Severe obesity (BMI >= 40) (HCC) Discussed diet and exercise for person with BMI >25 Will recheck weight in 3-6 months   Previous lab results reviewed Health Maintenance reviewed Diet and exercise encouraged  Follow up plan: 3 months     I discussed the assessment and treatment plan with the patient. The patient was provided an opportunity to ask questions and all were answered. The patient agreed with the plan and demonstrated an understanding of the instructions.  The patient was advised to call back or seek an in-person evaluation if the symptoms worsen or if the condition fails to improve as anticipated.  The above assessment and management plan was discussed with the patient. The patient verbalized understanding of and has agreed to the management plan. Patient is aware to call the clinic if symptoms persist or worsen. Patient is aware when to return to the clinic for a follow-up visit. Patient educated on when it is appropriate to go to the emergency department.   Time call ended:  8:17  I provided 17 minutes of non-face-to-face time during this encounter.    Mary-Margaret Hassell Done, FNP

## 2019-09-06 ENCOUNTER — Ambulatory Visit: Payer: Self-pay | Admitting: Nurse Practitioner

## 2019-09-07 ENCOUNTER — Other Ambulatory Visit: Payer: Self-pay

## 2019-09-07 ENCOUNTER — Ambulatory Visit (INDEPENDENT_AMBULATORY_CARE_PROVIDER_SITE_OTHER): Payer: PRIVATE HEALTH INSURANCE | Admitting: Nurse Practitioner

## 2019-09-07 ENCOUNTER — Encounter: Payer: Self-pay | Admitting: Nurse Practitioner

## 2019-09-07 VITALS — BP 118/76 | HR 67 | Temp 97.0°F | Resp 20 | Ht 70.0 in | Wt 302.0 lb

## 2019-09-07 DIAGNOSIS — F5101 Primary insomnia: Secondary | ICD-10-CM

## 2019-09-07 DIAGNOSIS — I10 Essential (primary) hypertension: Secondary | ICD-10-CM

## 2019-09-07 DIAGNOSIS — E785 Hyperlipidemia, unspecified: Secondary | ICD-10-CM | POA: Diagnosis not present

## 2019-09-07 DIAGNOSIS — C8339 Diffuse large B-cell lymphoma, extranodal and solid organ sites: Secondary | ICD-10-CM

## 2019-09-07 DIAGNOSIS — I509 Heart failure, unspecified: Secondary | ICD-10-CM

## 2019-09-07 DIAGNOSIS — F411 Generalized anxiety disorder: Secondary | ICD-10-CM

## 2019-09-07 MED ORDER — ZOLPIDEM TARTRATE 5 MG PO TABS: 5.0000 mg | ORAL_TABLET | Freq: Every evening | ORAL | 3 refills | Status: DC | PRN

## 2019-09-07 MED ORDER — LOSARTAN POTASSIUM 25 MG PO TABS: 25.0000 mg | ORAL_TABLET | Freq: Every day | ORAL | 1 refills | Status: DC

## 2019-09-07 MED ORDER — TRAZODONE HCL 50 MG PO TABS: 25.0000 mg | ORAL_TABLET | Freq: Every evening | ORAL | 1 refills | Status: DC | PRN

## 2019-09-07 MED ORDER — CARVEDILOL 3.125 MG PO TABS: 3.1250 mg | ORAL_TABLET | Freq: Two times a day (BID) | ORAL | 1 refills | Status: DC

## 2019-09-07 MED ORDER — CLONAZEPAM 0.5 MG PO TABS: 0.5000 mg | ORAL_TABLET | Freq: Two times a day (BID) | ORAL | 3 refills | Status: DC

## 2019-09-07 MED ORDER — DULOXETINE HCL 60 MG PO CPEP: 60.0000 mg | ORAL_CAPSULE | Freq: Every day | ORAL | 1 refills | Status: DC

## 2019-09-07 MED ORDER — ATORVASTATIN CALCIUM 40 MG PO TABS: 40.0000 mg | ORAL_TABLET | Freq: Every day | ORAL | 1 refills | Status: DC

## 2019-09-07 NOTE — Patient Instructions (Signed)
Exercising to Stay Healthy To become healthy and stay healthy, it is recommended that you do moderate-intensity and vigorous-intensity exercise. You can tell that you are exercising at a moderate intensity if your heart starts beating faster and you start breathing faster but can still hold a conversation. You can tell that you are exercising at a vigorous intensity if you are breathing much harder and faster and cannot hold a conversation while exercising. Exercising regularly is important. It has many health benefits, such as:  Improving overall fitness, flexibility, and endurance.  Increasing bone density.  Helping with weight control.  Decreasing body fat.  Increasing muscle strength.  Reducing stress and tension.  Improving overall health. How often should I exercise? Choose an activity that you enjoy, and set realistic goals. Your health care provider can help you make an activity plan that works for you. Exercise regularly as told by your health care provider. This may include:  Doing strength training two times a week, such as: ? Lifting weights. ? Using resistance bands. ? Push-ups. ? Sit-ups. ? Yoga.  Doing a certain intensity of exercise for a given amount of time. Choose from these options: ? A total of 150 minutes of moderate-intensity exercise every week. ? A total of 75 minutes of vigorous-intensity exercise every week. ? A mix of moderate-intensity and vigorous-intensity exercise every week. Children, pregnant women, people who have not exercised regularly, people who are overweight, and older adults may need to talk with a health care provider about what activities are safe to do. If you have a medical condition, be sure to talk with your health care provider before you start a new exercise program. What are some exercise ideas? Moderate-intensity exercise ideas include:  Walking 1 mile (1.6 km) in about 15 minutes.  Biking.  Hiking.  Golfing.  Dancing.   Water aerobics. Vigorous-intensity exercise ideas include:  Walking 4.5 miles (7.2 km) or more in about 1 hour.  Jogging or running 5 miles (8 km) in about 1 hour.  Biking 10 miles (16.1 km) or more in about 1 hour.  Lap swimming.  Roller-skating or in-line skating.  Cross-country skiing.  Vigorous competitive sports, such as football, basketball, and soccer.  Jumping rope.  Aerobic dancing. What are some everyday activities that can help me to get exercise?  Yard work, such as: ? Pushing a lawn mower. ? Raking and bagging leaves.  Washing your car.  Pushing a stroller.  Shoveling snow.  Gardening.  Washing windows or floors. How can I be more active in my day-to-day activities?  Use stairs instead of an elevator.  Take a walk during your lunch break.  If you drive, park your car farther away from your work or school.  If you take public transportation, get off one stop early and walk the rest of the way.  Stand up or walk around during all of your indoor phone calls.  Get up, stretch, and walk around every 30 minutes throughout the day.  Enjoy exercise with a friend. Support to continue exercising will help you keep a regular routine of activity. What guidelines can I follow while exercising?  Before you start a new exercise program, talk with your health care provider.  Do not exercise so much that you hurt yourself, feel dizzy, or get very short of breath.  Wear comfortable clothes and wear shoes with good support.  Drink plenty of water while you exercise to prevent dehydration or heat stroke.  Work out until your breathing   and your heartbeat get faster. Where to find more information  [place] Department of Health and Human Services: www.hhs.gov  Centers for Disease Control and Prevention (CDC): www.cdc.gov Summary  Exercising regularly is important. It will improve your overall fitness, flexibility, and endurance.  Regular exercise also will  improve your overall health. It can help you control your weight, reduce stress, and improve your bone density.  Do not exercise so much that you hurt yourself, feel dizzy, or get very short of breath.  Before you start a new exercise program, talk with your health care provider. This information is not intended to replace advice given to you by your health care provider. Make sure you discuss any questions you have with your health care provider. Document Released: 11/09/2010 Document Revised: 09/19/2017 Document Reviewed: 08/28/2017 Elsevier Patient Education  2020 [employer]vier Inc.  

## 2019-09-07 NOTE — Progress Notes (Signed)
Subjective:    Patient ID: Matthew Irwin, male    DOB: 1956/03/20, 63 y.o.   MRN: [id number]   Chief Complaint: Medical Management of Chronic Issues    HPI:  1. Essential hypertension No c/o chest pain, sob or headache. Does not check blood pressure at home. BP Readings from Last 3 Encounters:  09/07/19 118/76  11/26/18 132/87  06/30/18 125/81     2. Hyperlipidemia with target LDL less than 100 Does not watch diet and does very little exercise. He does stay very active with his job.  3. Chronic congestive heart failure, unspecified heart failure type ([place]) denes SOB i 4. Primary insomnia Is on ambien nightly and works well for him.  5. GAD (generalized anxiety disorder) Is on klonopin daily BID and does well. GAD 7 : Generalized Anxiety Score 09/07/2019  Nervous, Anxious, on Edge 0  Control/stop worrying 0  Worry too much - different things 0  Trouble relaxing 0  Restless 0  Easily annoyed or irritable 0  Afraid - awful might happen 0  Total GAD 7 Score 0  Anxiety Difficulty Not difficult at all      6. 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 Has been released by oncology.  7. Severe obesity (BMI >= 40) (HCC) No recent weight chnages Wt Readings from Last 3 Encounters:  09/07/19 (!) 302 lb (137 kg)  11/26/18 299 lb (135.6 kg)  06/30/18 299 lb (135.6 kg)   BMI Readings from Last 3 Encounters:  09/07/19 43.33 kg/m  11/26/18 42.90 kg/m  06/30/18 42.90 kg/m       Outpatient Encounter Medications as of 09/07/2019  Medication Sig  . aspirin 81 MG tablet Take 81 mg by mouth at bedtime.   Marland Kitchen atorvastatin (LIPITOR) 40 MG tablet Take 1 tablet (40 mg total) by mouth daily at 6 PM.  . carvedilol (COREG) 3.125 MG tablet Take 1 tablet (3.125 mg total) by mouth 2 (two) times daily with a meal.  . cetirizine (ZYRTEC) 10 MG tablet TAKE 1 TABLET DAILY  . clonazePAM (KLONOPIN) 0.5 MG tablet Take 1  tablet (0.5 mg total) by mouth 2 (two) times daily.  . DULoxetine (CYMBALTA) 60 MG capsule Take 1 capsule (60 mg total) by mouth daily.  . fluticasone (FLONASE) 50 MCG/ACT nasal spray Place 2 sprays into both nostrils daily.  Marland Kitchen losartan (COZAAR) 25 MG tablet Take 1 tablet (25 mg total) by mouth daily.  . Multiple Vitamins-Minerals (CENTRUM SILVER ULTRA MENS) TABS Take 1 tablet by mouth daily.   . Olopatadine HCl (PATADAY OP) Apply to eye.  . Omega-3 Fatty Acids (FISH OIL) 1000 MG CAPS Take 1,000 mg by mouth 2 (two) times daily.   Marland Kitchen Propylene Glycol-Glycerin (SOOTHE) 0.6-0.6 % SOLN Apply 1 drop to eye daily as needed (dry eyes).  . traZODone (DESYREL) 50 MG tablet Take 0.5-1 tablets (25-50 mg total) by mouth at bedtime as needed for sleep.  Marland Kitchen zolpidem (AMBIEN) 5 MG tablet Take 1 tablet (5 mg total) by mouth at bedtime as needed. for sleep    Past Surgical History:  Procedure Laterality Date  . BACK SURGERY  07/2008   tumor removed off spine  . heart sack removed  07/2009  . HEMORRHOID SURGERY    . LEFT HEART CATH  10/10  . NASAL SINUS SURGERY    . PORT-A-CATH REMOVAL Left 12/13/2016   Procedure: MINOR REMOVAL PORT-A-CATH;  Surgeon: Aviva Signs, MD;  Location: [facility];  Service: General;  Laterality: Left;  . PORTACATH PLACEMENT    . TUMOR REMOVAL      Family History  Problem Relation Age of Onset  . Parkinson's disease Mother   . Turner syndrome Sister        questionable  . Colon cancer Neg Hx     New complaints: None today  Social history: Has a grandbaby that he adores  Controlled substance contract: n/a    Review of Systems  Constitutional: Negative for activity change and appetite change.  HENT: Negative.   Eyes: Negative for pain.  Respiratory: Negative for shortness of breath.   Cardiovascular: Negative for chest pain, palpitations and leg swelling.  Gastrointestinal: Negative for abdominal pain.  Endocrine: Negative for polydipsia.  Genitourinary: Negative.    Skin: Negative for rash.  Neurological: Negative for dizziness, weakness and headaches.  Hematological: Does not bruise/bleed easily.  Psychiatric/Behavioral: Negative.   All other systems reviewed and are negative.      Objective:   Physical Exam Vitals signs and nursing note reviewed.  Constitutional:      Appearance: Normal appearance. He is well-developed.  HENT:     Head: Normocephalic.     Nose: Nose normal.  Eyes:     Pupils: Pupils are equal, round, and reactive to light.  Neck:     Musculoskeletal: Normal range of motion and neck supple.     Thyroid: No thyroid mass or thyromegaly.     Vascular: No carotid bruit or JVD.     Trachea: Phonation normal.  Cardiovascular:     Rate and Rhythm: Normal rate and regular rhythm.  Pulmonary:     Effort: Pulmonary effort is normal. No respiratory distress.     Breath sounds: Normal breath sounds.  Abdominal:     General: Bowel sounds are normal.     Palpations: Abdomen is soft.     Tenderness: There is no abdominal tenderness.  Musculoskeletal: Normal range of motion.  Lymphadenopathy:     Cervical: No cervical adenopathy.  Skin:    General: Skin is warm and dry.  Neurological:     Mental Status: He is alert and oriented to person, place, and time.  Psychiatric:        Behavior: Behavior normal.        Thought Content: Thought content normal.        Judgment: Judgment normal.    BP 118/76   Pulse 67   Temp (!) 97 F (36.1 C) (Temporal)   Resp 20   Ht 5\' 10"  (1.778 m)   Wt (!) 302 lb (137 kg)   SpO2 96%   BMI 43.33 kg/m         Assessment & Plan:  NECO VIVERITO comes in today with chief complaint of Medical Management of Chronic Issues   Diagnosis and orders addressed:  1. Essential hypertension Low sodium diet - carvedilol (COREG) 3.125 MG tablet; Take 1 tablet (3.125 mg total) by mouth 2 (two) times daily with a meal.  Dispense: 180 tablet; Refill: 1 - losartan (COZAAR) 25 MG tablet; Take 1 tablet (25  mg total) by mouth daily.  Dispense: 90 tablet; Refill: 1  2. Hyperlipidemia with target LDL less than 100 Low fat diet - atorvastatin (LIPITOR) 40 MG tablet; Take 1 tablet (40 mg total) by mouth daily at 6 PM.  Dispense: 90 tablet; Refill: 1  3. Chronic congestive heart failure, unspecified heart failure type ([place]) repORt any sob  4.  Primary insomnia BEDTIME ROUTINE - zolpidem (AMBIEN) 5 MG tablet; Take 1 tablet (5 mg total) by mouth at bedtime as needed. for sleep  Dispense: 30 tablet; Refill: 3  5. GAD (generalized anxiety disorder) STRESS MANAGEMENT - DULoxetine (CYMBALTA) 60 MG capsule; Take 1 capsule (60 mg total) by mouth daily.  Dispense: 90 capsule; Refill: 1 - clonazePAM (KLONOPIN) 0.5 MG tablet; Take 1 tablet (0.5 mg total) by mouth 2 (two) times daily.  Dispense: 60 tablet; Refill: 3  6. 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 Severe obesity (BMI >= 40) (HCC) Discussed diet and exercise for person with BMI >25 Will recheck weight in 3-6 months   Labs pending Health Maintenance reviewed Diet and exercise encouraged  Follow up plan: 3 months   Mary-Margaret Hassell Done, FNP

## 2019-09-08 LAB — CMP14+EGFR
ALT: 68 IU/L — ABNORMAL HIGH (ref 0–44)
AST: 55 IU/L — ABNORMAL HIGH (ref 0–40)
Albumin/Globulin Ratio: 1.4 (ref 1.2–2.2)
Albumin: 4.3 g/dL (ref 3.8–4.8)
Alkaline Phosphatase: 85 IU/L (ref 39–117)
BUN/Creatinine Ratio: 14 (ref 10–24)
BUN: 14 mg/dL (ref 8–27)
Bilirubin Total: 0.7 mg/dL (ref 0.0–1.2)
CO2: 25 mmol/L (ref 20–29)
Calcium: 9.8 mg/dL (ref 8.6–10.2)
Chloride: 100 mmol/L (ref 96–106)
Creatinine, Ser: 1.01 mg/dL (ref 0.76–1.27)
GFR calc Af Amer: 92 mL/min/{1.73_m2} (ref >59)
GFR calc non Af Amer: 79 mL/min/{1.73_m2} (ref >59)
Globulin, Total: 3.1 g/dL (ref 1.5–4.5)
Glucose: 95 mg/dL (ref 65–99)
Potassium: 4.4 mmol/L (ref 3.5–5.2)
Sodium: 141 mmol/L (ref 134–144)
Total Protein: 7.4 g/dL (ref 6.0–8.5)

## 2019-09-08 LAB — LIPID PANEL
Chol/HDL Ratio: 3.4 ratio (ref 0.0–5.0)
Cholesterol, Total: 133 mg/dL (ref 100–199)
HDL: 39 mg/dL — ABNORMAL LOW (ref >39)
LDL Chol Calc (NIH): 59 mg/dL (ref 0–99)
Triglycerides: 212 mg/dL — ABNORMAL HIGH (ref 0–149)
VLDL Cholesterol Cal: 35 mg/dL (ref 5–40)

## 2019-11-19 ENCOUNTER — Other Ambulatory Visit: Payer: Self-pay | Admitting: Nurse Practitioner

## 2019-11-19 DIAGNOSIS — Z9109 Other allergy status, other than to drugs and biological substances: Secondary | ICD-10-CM

## 2019-12-10 ENCOUNTER — Telehealth (INDEPENDENT_AMBULATORY_CARE_PROVIDER_SITE_OTHER): Payer: PRIVATE HEALTH INSURANCE | Admitting: Nurse Practitioner

## 2019-12-10 ENCOUNTER — Other Ambulatory Visit: Payer: Self-pay

## 2019-12-10 DIAGNOSIS — F411 Generalized anxiety disorder: Secondary | ICD-10-CM

## 2019-12-10 DIAGNOSIS — F5101 Primary insomnia: Secondary | ICD-10-CM

## 2019-12-10 DIAGNOSIS — C8339 Diffuse large B-cell lymphoma, extranodal and solid organ sites: Secondary | ICD-10-CM | POA: Diagnosis not present

## 2019-12-10 DIAGNOSIS — I10 Essential (primary) hypertension: Secondary | ICD-10-CM | POA: Diagnosis not present

## 2019-12-10 DIAGNOSIS — I509 Heart failure, unspecified: Secondary | ICD-10-CM

## 2019-12-10 DIAGNOSIS — E785 Hyperlipidemia, unspecified: Secondary | ICD-10-CM | POA: Diagnosis not present

## 2019-12-10 MED ORDER — CARVEDILOL 3.125 MG PO TABS: 3.1250 mg | ORAL_TABLET | Freq: Two times a day (BID) | ORAL | 1 refills | Status: DC

## 2019-12-10 MED ORDER — ZOLPIDEM TARTRATE 5 MG PO TABS: 5.0000 mg | ORAL_TABLET | Freq: Every evening | ORAL | 3 refills | Status: DC | PRN

## 2019-12-10 MED ORDER — CLONAZEPAM 0.5 MG PO TABS: 0.5000 mg | ORAL_TABLET | Freq: Two times a day (BID) | ORAL | 3 refills | Status: DC

## 2019-12-10 MED ORDER — TRAZODONE HCL 50 MG PO TABS: 25.0000 mg | ORAL_TABLET | Freq: Every evening | ORAL | 1 refills | Status: DC | PRN

## 2019-12-10 MED ORDER — LOSARTAN POTASSIUM 25 MG PO TABS: 25.0000 mg | ORAL_TABLET | Freq: Every day | ORAL | 1 refills | Status: DC

## 2019-12-10 MED ORDER — ATORVASTATIN CALCIUM 40 MG PO TABS: 40.0000 mg | ORAL_TABLET | Freq: Every day | ORAL | 1 refills | Status: DC

## 2019-12-10 MED ORDER — DULOXETINE HCL 60 MG PO CPEP: 60.0000 mg | ORAL_CAPSULE | Freq: Every day | ORAL | 1 refills | Status: DC

## 2019-12-10 NOTE — Progress Notes (Signed)
Virtual Visit via telephone Note  I connected with Matthew Irwin on 12/10/19 at 9:45 by video and verified that I am speaking with the correct person using two identifiers. Matthew Irwin is currently located at home and his wife is currently with him during visit. The provider, Mary-Margaret Hassell Done, FNP is located in their office at time of visit.  I discussed the limitations, risks, security and privacy concerns of performing an evaluation and management service by telephone and the availability of in person appointments. I also discussed with the patient that there may be a patient responsible charge related to this service. The patient expressed understanding and agreed to proceed.   History and Present Illness:   Chief Complaint: Medical Management of Chronic Issues    HPI:  1. Essential hypertension No c/o chest pain, sob or headache. Does not check blood pressure at home. BP Readings from Last 3 Encounters:  09/07/19 118/76  11/26/18 132/87  06/30/18 125/81     2. Hyperlipidemia with target LDL less than 100 Does not watch diet and does very little exercise. Lab Results  Component Value Date   CHOL 133 09/07/2019   HDL 39 (L) 09/07/2019   LDLCALC 59 09/07/2019   TRIG 212 (H) 09/07/2019   CHOLHDL 3.4 09/07/2019     3. Chronic congestive heart failure, unspecified heart failure type [facility]) He has not seen cardiology in over 5 years. He does not feel lik ehe needs to go at this time.  4. 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 He denies any back pain or other issues.  5. GAD (generalized anxiety disorder) He takes klonopin 1x  A day and x a day as needed. GAD 7 : Generalized Anxiety Score 12/10/2019 09/07/2019  Nervous, Anxious, on Edge 1 0  Control/stop worrying 0 0  Worry too much - different things 0 0  Trouble relaxing 0 0  Restless 0 0  Easily annoyed or irritable 1 0  Afraid - awful might  happen 0 0  Total GAD 7 Score 2 0  Anxiety Difficulty Somewhat difficult Not difficult at all      6. Primary insomnia Is on ambien nightly and is sleeping well.  7. Severe obesity (BMI >= 40) (HCC) No recent weight changes Wt Readings from Last 3 Encounters:  09/07/19 (!) 302 lb (137 kg)  11/26/18 299 lb (135.6 kg)  06/30/18 299 lb (135.6 kg)   BMI Readings from Last 3 Encounters:  09/07/19 43.33 kg/m  11/26/18 42.90 kg/m  06/30/18 42.90 kg/m       Outpatient Encounter Medications as of 12/10/2019  Medication Sig  . aspirin 81 MG tablet Take 81 mg by mouth at bedtime.   Marland Kitchen atorvastatin (LIPITOR) 40 MG tablet Take 1 tablet (40 mg total) by mouth daily at 6 PM.  . carvedilol (COREG) 3.125 MG tablet Take 1 tablet (3.125 mg total) by mouth 2 (two) times daily with a meal.  . cetirizine (ZYRTEC) 10 MG tablet TAKE 1 TABLET DAILY  . clonazePAM (KLONOPIN) 0.5 MG tablet Take 1 tablet (0.5 mg total) by mouth 2 (two) times daily.  . DULoxetine (CYMBALTA) 60 MG capsule Take 1 capsule (60 mg total) by mouth daily.  . fluticasone (FLONASE) 50 MCG/ACT nasal spray Place 2 sprays into both nostrils daily.  Marland Kitchen losartan (COZAAR) 25 MG tablet Take 1 tablet (25 mg total) by mouth daily.  . Multiple Vitamins-Minerals (CENTRUM SILVER ULTRA MENS)  TABS Take 1 tablet by mouth daily.   . Olopatadine HCl (PATADAY OP) Apply to eye.  . Omega-3 Fatty Acids (FISH OIL) 1000 MG CAPS Take 1,000 mg by mouth 2 (two) times daily.   Marland Kitchen Propylene Glycol-Glycerin (SOOTHE) 0.6-0.6 % SOLN Apply 1 drop to eye daily as needed (dry eyes).  . traZODone (DESYREL) 50 MG tablet Take 0.5-1 tablets (25-50 mg total) by mouth at bedtime as needed for sleep.  Marland Kitchen zolpidem (AMBIEN) 5 MG tablet Take 1 tablet (5 mg total) by mouth at bedtime as needed. for sleep     Past Surgical History:  Procedure Laterality Date  . BACK SURGERY  07/2008   tumor removed off spine  . heart sack removed  07/2009  . HEMORRHOID SURGERY    .  LEFT HEART CATH  10/10  . NASAL SINUS SURGERY    . PORT-A-CATH REMOVAL Left 12/13/2016   Procedure: MINOR REMOVAL PORT-A-CATH;  Surgeon: Aviva Signs, MD;  Location: [facility];  Service: General;  Laterality: Left;  . PORTACATH PLACEMENT    . TUMOR REMOVAL      Family History  Problem Relation Age of Onset  . Parkinson's disease Mother   . Turner syndrome Sister        questionable  . Colon cancer Neg Hx     New complaints: None today  Social history: Lives with wife and helps keep his grandchild  Controlled substance contract: 09/15/19    Review of Systems  Constitutional: Negative for diaphoresis and weight loss.  Eyes: Negative for blurred vision, double vision and pain.  Respiratory: Negative for shortness of breath.   Cardiovascular: Negative for chest pain, palpitations, orthopnea and leg swelling.  Gastrointestinal: Negative for abdominal pain.  Skin: Negative for rash.  Neurological: Negative for dizziness, sensory change, loss of consciousness, weakness and headaches.  Endo/Heme/Allergies: Negative for polydipsia. Does not bruise/bleed easily.  Psychiatric/Behavioral: Negative for memory loss. The patient does not have insomnia.   All other systems reviewed and are negative.      Observations/Objective: Alert and oriented- answers all questions appropriately No distress    Assessment and Plan: Matthew Irwin comes in today with chief complaint of Medical Management of Chronic Issues   Diagnosis and orders addressed:  1. Essential hypertension Low sodium diet - carvedilol (COREG) 3.125 MG tablet; Take 1 tablet (3.125 mg total) by mouth 2 (two) times daily with a meal.  Dispense: 180 tablet; Refill: 1 - losartan (COZAAR) 25 MG tablet; Take 1 tablet (25 mg total) by mouth daily.  Dispense: 90 tablet; Refill: 1  2. Hyperlipidemia with target LDL less than 100 Low fat diet - atorvastatin (LIPITOR) 40 MG tablet; Take 1 tablet (40 mg total) by mouth daily at 6  PM.  Dispense: 90 tablet; Refill: 1  3. Chronic congestive heart failure, unspecified heart failure type ([place]) Does not feel need to see cardiology at this time  4. 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 GAD (generalized anxiety disorder) Stress management - DULoxetine (CYMBALTA) 60 MG capsule; Take 1 capsule (60 mg total) by mouth daily.  Dispense: 90 capsule; Refill: 1 - clonazePAM (KLONOPIN) 0.5 MG tablet; Take 1 tablet (0.5 mg total) by mouth 2 (two) times daily.  Dispense: 60 tablet; Refill: 3  6. Primary insomnia Bedtime routine - zolpidem (AMBIEN) 5 MG tablet; Take 1 tablet (5 mg total) by mouth at bedtime as needed. for sleep  Dispense: 30 tablet; Refill: 3 - traZODone (  DESYREL) 50 MG tablet; Take 0.5-1 tablets (25-50 mg total) by mouth at bedtime as needed for sleep.  Dispense: 90 tablet; Refill: 1  7. Severe obesity (BMI >= 40) (HCC) Discussed diet and exercise for person with BMI >25 Will recheck weight in 3-6 months    Labs pending Health Maintenance reviewed Diet and exercise encouraged   Follow Up Instructions: 3 months    I discussed the assessment and treatment plan with the patient. The patient was provided an opportunity to ask questions and all were answered. The patient agreed with the plan and demonstrated an understanding of the instructions.   The patient was advised to call back or seek an in-person evaluation if the symptoms worsen or if the condition fails to improve as anticipated.  The above assessment and management plan was discussed with the patient. The patient verbalized understanding of and has agreed to the management plan. Patient is aware to call the clinic if symptoms persist or worsen. Patient is aware when to return to the clinic for a follow-up visit. Patient educated on when it is appropriate to go to the emergency department.   Time call ended:10:00  I provided 15 minutes of face-to-face  time during this encounter.    Mary-Margaret Hassell Done, FNP

## 2019-12-15 ENCOUNTER — Other Ambulatory Visit: Payer: Self-pay | Admitting: Nurse Practitioner

## 2019-12-15 DIAGNOSIS — Z9109 Other allergy status, other than to drugs and biological substances: Secondary | ICD-10-CM

## 2020-03-17 ENCOUNTER — Ambulatory Visit (INDEPENDENT_AMBULATORY_CARE_PROVIDER_SITE_OTHER): Payer: PRIVATE HEALTH INSURANCE | Admitting: Nurse Practitioner

## 2020-03-17 ENCOUNTER — Other Ambulatory Visit: Payer: Self-pay

## 2020-03-17 ENCOUNTER — Encounter: Payer: Self-pay | Admitting: Nurse Practitioner

## 2020-03-17 VITALS — BP 100/69 | HR 80 | Temp 97.0°F | Ht 70.0 in | Wt 303.0 lb

## 2020-03-17 DIAGNOSIS — E785 Hyperlipidemia, unspecified: Secondary | ICD-10-CM

## 2020-03-17 DIAGNOSIS — I509 Heart failure, unspecified: Secondary | ICD-10-CM | POA: Diagnosis not present

## 2020-03-17 DIAGNOSIS — F5101 Primary insomnia: Secondary | ICD-10-CM

## 2020-03-17 DIAGNOSIS — F411 Generalized anxiety disorder: Secondary | ICD-10-CM

## 2020-03-17 DIAGNOSIS — I311 Chronic constrictive pericarditis: Secondary | ICD-10-CM

## 2020-03-17 DIAGNOSIS — I10 Essential (primary) hypertension: Secondary | ICD-10-CM

## 2020-03-17 DIAGNOSIS — C8339 Diffuse large B-cell lymphoma, extranodal and solid organ sites: Secondary | ICD-10-CM

## 2020-03-17 MED ORDER — ATORVASTATIN CALCIUM 40 MG PO TABS: 40.0000 mg | ORAL_TABLET | Freq: Every day | ORAL | 1 refills | Status: DC

## 2020-03-17 MED ORDER — TRAZODONE HCL 50 MG PO TABS: 25.0000 mg | ORAL_TABLET | Freq: Every evening | ORAL | 1 refills | Status: DC | PRN

## 2020-03-17 MED ORDER — CARVEDILOL 3.125 MG PO TABS: 3.1250 mg | ORAL_TABLET | Freq: Two times a day (BID) | ORAL | 1 refills | Status: DC

## 2020-03-17 MED ORDER — LOSARTAN POTASSIUM 25 MG PO TABS: 25.0000 mg | ORAL_TABLET | Freq: Every day | ORAL | 1 refills | Status: DC

## 2020-03-17 MED ORDER — ZOLPIDEM TARTRATE 5 MG PO TABS: 5.0000 mg | ORAL_TABLET | Freq: Every evening | ORAL | 3 refills | Status: DC | PRN

## 2020-03-17 MED ORDER — DULOXETINE HCL 60 MG PO CPEP: 60.0000 mg | ORAL_CAPSULE | Freq: Every day | ORAL | 1 refills | Status: DC

## 2020-03-17 MED ORDER — CLONAZEPAM 0.5 MG PO TABS: 0.5000 mg | ORAL_TABLET | Freq: Two times a day (BID) | ORAL | 3 refills | Status: DC

## 2020-03-17 NOTE — Progress Notes (Signed)
Subjective:    Patient ID: Matthew Irwin, male    DOB: 1956/05/25, 64 y.o.   MRN: [id number]   Chief Complaint: medical management of chronic issues     HPI:  1. Essential hypertension No c/o chest pain, sob or headache. Does not check blood pressure at home. BP Readings from Last 3 Encounters:  09/07/19 118/76  11/26/18 132/87  06/30/18 125/81     2. Chronic congestive heart failure, unspecified heart failure type [facility]) Patient denies any lower ext edema.   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 He doe not feel that he need to, because he I having no symptoms.  4. Hyperlipidemia with target LDL less than 100 Does not watch diet and doe very little exercise. Lab Results  Component Value Date   CHOL 133 09/07/2019   HDL 39 (L) 09/07/2019   LDLCALC 59 09/07/2019   TRIG 212 (H) 09/07/2019   CHOLHDL 3.4 09/07/2019     5. GAD (generalized anxiety disorder) Is on klonopin BID and is doing well. GAD 7 : Generalized Anxiety Score 12/10/2019 09/07/2019  Nervous, Anxious, on Edge 1 0  Control/stop worrying 0 0  Worry too much - different things 0 0  Trouble relaxing 0 0  Restless 0 0  Easily annoyed or irritable 1 0  Afraid - awful might happen 0 0  Total GAD 7 Score 2 0  Anxiety Difficulty Somewhat difficult Not difficult at all      6. 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  7. Primary insomnia Is on ambien and trazadone to sleep at night. He say if he does not take both then he is not able to sleep  8. Severe obesity (BMI >= 40) (HCC) No recent weight changes Wt Readings from Last 3 Encounters:  09/07/19 (!) 302 lb (137 kg)  11/26/18 299 lb (135.6 kg)  06/30/18 299 lb (135.6 kg)   BMI Readings from Last 3 Encounters:  09/07/19 43.33 kg/m  11/26/18 42.90 kg/m  06/30/18 42.90 kg/m       Outpatient Encounter Medications as of 03/17/2020  Medication Sig    . aspirin 81 MG tablet Take 81 mg by mouth at bedtime.   Marland Kitchen atorvastatin (LIPITOR) 40 MG tablet Take 1 tablet (40 mg total) by mouth daily at 6 PM.  . carvedilol (COREG) 3.125 MG tablet Take 1 tablet (3.125 mg total) by mouth 2 (two) times daily with a meal.  . cetirizine (ZYRTEC) 10 MG tablet TAKE 1 TABLET DAILY  . clonazePAM (KLONOPIN) 0.5 MG tablet Take 1 tablet (0.5 mg total) by mouth 2 (two) times daily.  . DULoxetine (CYMBALTA) 60 MG capsule Take 1 capsule (60 mg total) by mouth daily.  . fluticasone (FLONASE) 50 MCG/ACT nasal spray Place 2 sprays into both nostrils daily.  Marland Kitchen losartan (COZAAR) 25 MG tablet Take 1 tablet (25 mg total) by mouth daily.  . Multiple Vitamins-Minerals (CENTRUM SILVER ULTRA MENS) TABS Take 1 tablet by mouth daily.   . Olopatadine HCl (PATADAY OP) Apply to eye.  . Omega-3 Fatty Acids (FISH OIL) 1000 MG CAPS Take 1,000 mg by mouth 2 (two) times daily.   Marland Kitchen Propylene Glycol-Glycerin (SOOTHE) 0.6-0.6 % SOLN Apply 1 drop to eye daily as needed (dry eyes).  . traZODone (DESYREL) 50 MG tablet Take 0.5-1 tablets (25-50 mg total) by mouth at bedtime as needed for sleep.  Marland Kitchen zolpidem (AMBIEN) 5 MG tablet Take  1 tablet (5 mg total) by mouth at bedtime as needed. for sleep    Past Surgical History:  Procedure Laterality Date  . BACK SURGERY  07/2008   tumor removed off spine  . heart sack removed  07/2009  . HEMORRHOID SURGERY    . LEFT HEART CATH  10/10  . NASAL SINUS SURGERY    . PORT-A-CATH REMOVAL Left 12/13/2016   Procedure: MINOR REMOVAL PORT-A-CATH;  Surgeon: Aviva Signs, MD;  Location: [facility];  Service: General;  Laterality: Left;  . PORTACATH PLACEMENT    . TUMOR REMOVAL      Family History  Problem Relation Age of Onset  . Parkinson's disease Mother   . Turner syndrome Sister        questionable  . 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  Eyes: Negative for pain.  Respiratory: Negative for shortness of breath.   Cardiovascular: Negative for chest pain, palpitations and leg swelling.  Gastrointestinal: Negative for abdominal pain.  Endocrine: Negative for polydipsia.  Skin: Negative for rash.  Neurological: Negative for dizziness, weakness and headaches.  Hematological: Does not bruise/bleed easily.  All other systems reviewed and are negative.      Objective:   Physical Exam Vitals and nursing note reviewed.  Constitutional:      Appearance: Normal appearance. He is well-developed.  HENT:     Head: Normocephalic.     Nose: Nose normal.  Eyes:     Pupils: Pupils are equal, round, and reactive to light.  Neck:     Thyroid: No thyroid mass or thyromegaly.     Vascular: No carotid bruit or JVD.     Trachea: Phonation normal.  Cardiovascular:     Rate and Rhythm: Normal rate and regular rhythm.  Pulmonary:     Effort: Pulmonary effort is normal. No respiratory distress.     Breath sounds: Normal breath sounds.  Abdominal:     General: Bowel sounds are normal.     Palpations: Abdomen is soft.     Tenderness: There is no abdominal tenderness.  Musculoskeletal:        General: Normal range of motion.     Cervical back: Normal range of motion and neck supple.  Lymphadenopathy:     Cervical: No cervical adenopathy.  Skin:    General: Skin is warm and dry.  Neurological:     Mental Status: He is alert and oriented to person, place, and time.  Psychiatric:        Behavior: Behavior normal.        Thought Content: Thought content normal.        Judgment: Judgment normal.    BP 100/69   Pulse 80   Temp (!) 97 F (36.1 C)   Ht _0  (1.778 m)   Wt (!) 303 lb (137.4 kg)   SpO2 94%   BMI 43.48 kg/m         Assessment & Plan:  HOUSTON ZAPIEN comes in today with chief complaint of Medical Management of Chronic Issues and  Hypertension   Diagnosis and orders addressed:  1. Essential hypertension Low sodium diet - CBC with Differential/Platelet - CMP14+EGFR - carvedilol (COREG) 3.125 MG tablet; Take 1 tablet (3.125 mg total) by mouth 2 (two) times daily with a meal.  Dispense:  180 tablet; Refill: 1 - losartan (COZAAR) 25 MG tablet; Take 1 tablet (25 mg total) by mouth daily.  Dispense: 90 tablet; Refill: 1  2. 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 Hyperlipidemia with target LDL less than 100 Low fat diet - Lipid panel - atorvastatin (LIPITOR) 40 MG tablet; Take 1 tablet (40 mg total) by mouth daily at 6 PM.  Dispense: 90 tablet; Refill: 1  5. GAD (generalized anxiety disorder) Stress management - DULoxetine (CYMBALTA) 60 MG capsule; Take 1 capsule (60 mg total) by mouth daily.  Dispense: 90 capsule; Refill: 1 - clonazePAM (KLONOPIN) 0.5 MG tablet; Take 1 tablet (0.5 mg total) by mouth 2 (two) times daily.  Dispense: 60 tablet; Refill: 3  6. 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 Primary insomnia Bedtime routine - zolpidem (AMBIEN) 5 MG tablet; Take 1 tablet (5 mg total) by mouth at bedtime as needed. for sleep  Dispense: 30 tablet; Refill: 3 - traZODone (DESYREL) 50 MG tablet; Take 0.5-1 tablets (25-50 mg total) by mouth at bedtime as needed for sleep.  Dispense: 90 tablet; Refill: 1  8. Severe obesity (BMI >= 40) (HCC) Discussed diet and exercise for person with BMI >25 Will recheck weight in 3-6 months    Labs pending Health Maintenance reviewed Diet and exercise encouraged  Follow up plan: 3 months   Mary-Margaret Hassell Done, FNP

## 2020-03-17 NOTE — Patient Instructions (Signed)

## 2020-03-18 LAB — LIPID PANEL
Chol/HDL Ratio: 4.1 ratio (ref 0.0–5.0)
Cholesterol, Total: 135 mg/dL (ref 100–199)
HDL: 33 mg/dL — ABNORMAL LOW (ref >39)
LDL Chol Calc (NIH): 64 mg/dL (ref 0–99)
Triglycerides: 232 mg/dL — ABNORMAL HIGH (ref 0–149)
VLDL Cholesterol Cal: 38 mg/dL (ref 5–40)

## 2020-03-18 LAB — CMP14+EGFR
ALT: 68 IU/L — ABNORMAL HIGH (ref 0–44)
AST: 58 IU/L — ABNORMAL HIGH (ref 0–40)
Albumin/Globulin Ratio: 1.4 (ref 1.2–2.2)
Albumin: 4.2 g/dL (ref 3.8–4.8)
Alkaline Phosphatase: 81 IU/L (ref 48–121)
BUN/Creatinine Ratio: 16 (ref 10–24)
BUN: 16 mg/dL (ref 8–27)
Bilirubin Total: 0.8 mg/dL (ref 0.0–1.2)
CO2: 25 mmol/L (ref 20–29)
Calcium: 9.6 mg/dL (ref 8.6–10.2)
Chloride: 101 mmol/L (ref 96–106)
Creatinine, Ser: 0.99 mg/dL (ref 0.76–1.27)
GFR calc Af Amer: 93 mL/min/{1.73_m2} (ref >59)
GFR calc non Af Amer: 81 mL/min/{1.73_m2} (ref >59)
Globulin, Total: 3.1 g/dL (ref 1.5–4.5)
Glucose: 133 mg/dL — ABNORMAL HIGH (ref 65–99)
Potassium: 4.1 mmol/L (ref 3.5–5.2)
Sodium: 140 mmol/L (ref 134–144)
Total Protein: 7.3 g/dL (ref 6.0–8.5)

## 2020-03-18 LAB — CBC WITH DIFFERENTIAL/PLATELET
Basophils Absolute: 0 10*3/uL (ref 0.0–0.2)
Basos: 1 %
EOS (ABSOLUTE): 0.3 10*3/uL (ref 0.0–0.4)
Eos: 5 %
Hematocrit: 41.8 % (ref 37.5–51.0)
Hemoglobin: 14.2 g/dL (ref 13.0–17.7)
Immature Grans (Abs): 0 10*3/uL (ref 0.0–0.1)
Immature Granulocytes: 0 %
Lymphocytes Absolute: 1.5 10*3/uL (ref 0.7–3.1)
Lymphs: 24 %
MCH: 31.8 pg (ref 26.6–33.0)
MCHC: 34 g/dL (ref 31.5–35.7)
MCV: 94 fL (ref 79–97)
Monocytes Absolute: 0.6 10*3/uL (ref 0.1–0.9)
Monocytes: 10 %
Neutrophils Absolute: 3.7 10*3/uL (ref 1.4–7.0)
Neutrophils: 60 %
Platelets: 171 10*3/uL (ref 150–450)
RBC: 4.47 x10E6/uL (ref 4.14–5.80)
RDW: 11.8 % (ref 11.6–15.4)
WBC: 6.2 10*3/uL (ref 3.4–10.8)

## 2020-03-18 LAB — BETA 2 MICROGLOBULIN, SERUM: Beta-2: 1.9 mg/L (ref 0.6–2.4)

## 2020-06-20 ENCOUNTER — Other Ambulatory Visit: Payer: Self-pay

## 2020-06-20 ENCOUNTER — Encounter: Payer: Self-pay | Admitting: Nurse Practitioner

## 2020-06-20 ENCOUNTER — Ambulatory Visit (INDEPENDENT_AMBULATORY_CARE_PROVIDER_SITE_OTHER): Payer: PRIVATE HEALTH INSURANCE | Admitting: Nurse Practitioner

## 2020-06-20 VITALS — BP 105/79 | HR 94 | Temp 97.4°F | Resp 20 | Ht 70.0 in | Wt 302.0 lb

## 2020-06-20 DIAGNOSIS — F411 Generalized anxiety disorder: Secondary | ICD-10-CM | POA: Diagnosis not present

## 2020-06-20 DIAGNOSIS — I10 Essential (primary) hypertension: Secondary | ICD-10-CM | POA: Diagnosis not present

## 2020-06-20 DIAGNOSIS — I509 Heart failure, unspecified: Secondary | ICD-10-CM | POA: Diagnosis not present

## 2020-06-20 DIAGNOSIS — E785 Hyperlipidemia, unspecified: Secondary | ICD-10-CM

## 2020-06-20 DIAGNOSIS — Z125 Encounter for screening for malignant neoplasm of prostate: Secondary | ICD-10-CM

## 2020-06-20 NOTE — Progress Notes (Signed)
Subjective:    Patient ID: Matthew Irwin, male    DOB: 17-May-1956, 64 y.o.   MRN: [id number]   Chief Complaint: No chief complaint on file.    HPI:  1. Chronic congestive heart failure, unspecified heart failure type [facility]) Denies any new or worsening symptoms. No chest pain, swelling in legs, or headaches.   2. Essential hypertension BP Readings from Last 3 Encounters:  06/20/20 105/79  03/17/20 100/69  09/07/19 118/76   Does not check blood pressure at home. Takes medication as prescribed. Does not watch diet or salt intake.   3. Hyperlipidemia with target LDL less than 100 Lab Results  Component Value Date   CHOL 135 03/17/2020   HDL 33 (L) 03/17/2020   LDLCALC 64 03/17/2020   TRIG 232 (H) 03/17/2020   CHOLHDL 4.1 03/17/2020   Does not avoid fried or fast foods. Takes medication as prescribed.   4. GAD (generalized anxiety disorder) GAD 7 : Generalized Anxiety Score 12/10/2019 09/07/2019  Nervous, Anxious, on Edge 1 0  Control/stop worrying 0 0  Worry too much - different things 0 0  Trouble relaxing 0 0  Restless 0 0  Easily annoyed or irritable 1 0  Afraid - awful might happen 0 0  Total GAD 7 Score 2 0  Anxiety Difficulty Somewhat difficult Not difficult at all   States no increased symptoms. Takes 0.5 klonopin a day due to stress at work, may take 2 tablets 1-2 times a month.   5. Severe obesity (BMI >= 40) (HCC) BMI Readings from Last 3 Encounters:  06/20/20 43.33 kg/m  03/17/20 43.48 kg/m  09/07/19 43.33 kg/m   Wt Readings from Last 3 Encounters:  06/20/20 (!) 302 lb (137 kg)  03/17/20 (!) 303 lb (137.4 kg)  09/07/19 (!) 302 lb (137 kg)   Works as Investment banker, operational, walks some at work. Denies any formal exercise.     Outpatient Encounter Medications as of 06/20/2020  Medication Sig  . aspirin 81 MG tablet Take 81 mg by mouth at bedtime.   Marland Kitchen atorvastatin (LIPITOR) 40 MG tablet Take 1 tablet (40 mg total) by mouth daily at 6 PM.  . carvedilol (COREG)  3.125 MG tablet Take 1 tablet (3.125 mg total) by mouth 2 (two) times daily with a meal.  . cetirizine (ZYRTEC) 10 MG tablet TAKE 1 TABLET DAILY  . clonazePAM (KLONOPIN) 0.5 MG tablet Take 1 tablet (0.5 mg total) by mouth 2 (two) times daily.  . DULoxetine (CYMBALTA) 60 MG capsule Take 1 capsule (60 mg total) by mouth daily.  . fluticasone (FLONASE) 50 MCG/ACT nasal spray Place 2 sprays into both nostrils daily.  Marland Kitchen losartan (COZAAR) 25 MG tablet Take 1 tablet (25 mg total) by mouth daily.  . Multiple Vitamins-Minerals (CENTRUM SILVER ULTRA MENS) TABS Take 1 tablet by mouth daily.   . Olopatadine HCl (PATADAY OP) Apply to eye.  . Omega-3 Fatty Acids (FISH OIL) 1000 MG CAPS Take 1,000 mg by mouth 2 (two) times daily.   Marland Kitchen Propylene Glycol-Glycerin (SOOTHE) 0.6-0.6 % SOLN Apply 1 drop to eye daily as needed (dry eyes).  . traZODone (DESYREL) 50 MG tablet Take 0.5-1 tablets (25-50 mg total) by mouth at bedtime as needed for sleep.  Marland Kitchen zolpidem (AMBIEN) 5 MG tablet Take 1 tablet (5 mg total) by mouth at bedtime as needed. for sleep   No facility-administered encounter medications on file as of 06/20/2020.    Past Surgical History:  Procedure Laterality Date  .  BACK SURGERY  07/2008   tumor removed off spine  . heart sack removed  07/2009  . HEMORRHOID SURGERY    . LEFT HEART CATH  10/10  . NASAL SINUS SURGERY    . PORT-A-CATH REMOVAL Left 12/13/2016   Procedure: MINOR REMOVAL PORT-A-CATH;  Surgeon: Aviva Signs, MD;  Location: [facility];  Service: General;  Laterality: Left;  . PORTACATH PLACEMENT    . TUMOR REMOVAL      Family History  Problem Relation Age of Onset  . Parkinson's disease Mother   . Turner syndrome Sister        questionable  . Colon cancer Neg Hx     New complaints: Fall onto left shoulder, c/o achy pain to bicep area of left arm. Takes ibuprofen with some relief. States sitting around makes it worse, feels better when being active. Can elicit pain with goal post motion  of left arm. Pain does not radiate. 2/10 pain.  Social history: Lives at home with wife, plays with grandchild for fun. Hunts for hobby.   Controlled substance contract: signed 09/15/2019    Review of Systems  Constitutional: Negative.   HENT: Negative.   Eyes: Negative.   Respiratory: Negative.   Cardiovascular: Negative.   Gastrointestinal: Negative.   Endocrine: Negative.   Genitourinary: Negative.   Musculoskeletal: Negative.   Skin: Negative.   Allergic/Immunologic: Negative.   Neurological: Negative.   Hematological: Negative.   Psychiatric/Behavioral: Negative.   All other systems reviewed and are negative.      Objective:   Physical Exam Vitals and nursing note reviewed.  Constitutional:      Appearance: Normal appearance.  HENT:     Head: Normocephalic and atraumatic.     Right Ear: Tympanic membrane, ear canal and external ear normal.     Left Ear: Tympanic membrane, ear canal and external ear normal.     Nose: Nose normal.     Mouth/Throat:     Mouth: Mucous membranes are moist.     Pharynx: Oropharynx is clear.  Eyes:     Extraocular Movements: Extraocular movements intact.     Conjunctiva/sclera: Conjunctivae normal.     Pupils: Pupils are equal, round, and reactive to light.  Cardiovascular:     Rate and Rhythm: Normal rate and regular rhythm.     Pulses: Normal pulses.     Heart sounds: Normal heart sounds.  Pulmonary:     Effort: Pulmonary effort is normal.     Breath sounds: Normal breath sounds.  Abdominal:     General: Abdomen is flat. Bowel sounds are normal.  Musculoskeletal:        General: Normal range of motion.     Left upper arm: Tenderness present.       Arms:     Cervical back: Normal range of motion.  Skin:    General: Skin is warm and dry.     Capillary Refill: Capillary refill takes less than 2 seconds.  Neurological:     General: No focal deficit present.     Mental Status: He is alert and oriented to person, place, and  time. Mental status is at baseline.  Psychiatric:        Mood and Affect: Mood normal.        Behavior: Behavior normal.        Thought Content: Thought content normal.        Judgment: Judgment normal.    BP 105/79   Pulse 94   Temp Marland Kitchen)  97.4 F (36.3 C) (Temporal)   Resp 20   Ht 5\' 10"  (1.778 m)   Wt (!) 302 lb (137 kg)   SpO2 93%   BMI 43.33 kg/m       Assessment & Plan:Matthew Irwin comes in today with chief complaint of Medical Management of Chronic Issues   Diagnosis and orders addressed:  1. Chronic congestive heart failure, unspecified heart failure type ([place]) Report any shortness of breath, leg swelling, or increased difficulty breathing  2. Essential hypertension Check blood pressure regularly and take medication as prescribed. Eat a heart healthy diet and avoid foods high in salt.   3. Hyperlipidemia with target LDL less than 100 Avoid foods that are fried or high in fat. Take medication as prescribed.   4. GAD (generalized anxiety disorder) Take medication as prescribed. Practice stress management.   5. Severe obesity (BMI >= 40) (HCC) Walking is a great cardiovascular, low-impact exercise that helps lower blood pressure and cholesterol levels. Eat a heart healthy diet, avoiding fried and fatty foods.    Labs pending Health Maintenance reviewed Diet and exercise encouraged  Follow up plan: Follow up in 3 months.    Mary-Margaret Hassell Done, FNP

## 2020-06-20 NOTE — Patient Instructions (Signed)
DASH Eating Plan DASH stands for "Dietary Approaches to Stop Hypertension." The DASH eating plan is a healthy eating plan that has been shown to reduce high blood pressure (hypertension). It may also reduce your risk for type 2 diabetes, heart disease, and stroke. The DASH eating plan may also help with weight loss. What are tips for following this plan?  General guidelines  Avoid eating more than 2,300 mg (milligrams) of salt (sodium) a day. If you have hypertension, you may need to reduce your sodium intake to 1,500 mg a day.  Limit alcohol intake to no more than 1 drink a day for nonpregnant women and 2 drinks a day for men. One drink equals 12 oz of beer, 5 oz of wine, or 1 oz of hard liquor.  Work with your health care provider to maintain a healthy body weight or to lose weight. Ask what an ideal weight is for you.  Get at least 30 minutes of exercise that causes your heart to beat faster (aerobic exercise) most days of the week. Activities may include walking, swimming, or biking.  Work with your health care provider or diet and nutrition specialist (dietitian) to adjust your eating plan to your individual calorie needs. Reading food labels   Check food labels for the amount of sodium per serving. Choose foods with less than 5 percent of the Daily Value of sodium. Generally, foods with less than 300 mg of sodium per serving fit into this eating plan.  To find whole grains, look for the word "whole" as the first word in the ingredient list. Shopping  Buy products labeled as "low-sodium" or "no salt added."  Buy fresh foods. Avoid canned foods and premade or frozen meals. Cooking  Avoid adding salt when cooking. Use salt-free seasonings or herbs instead of table salt or sea salt. Check with your health care provider or pharmacist before using salt substitutes.  Do not fry foods. Cook foods using healthy methods such as baking, boiling, grilling, and broiling instead.  Cook with  heart-healthy oils, such as olive, canola, soybean, or sunflower oil. Meal planning  Eat a balanced diet that includes: ? 5 or more servings of fruits and vegetables each day. At each meal, try to fill half of your plate with fruits and vegetables. ? Up to 6-8 servings of whole grains each day. ? Less than 6 oz of lean meat, poultry, or fish each day. A 3-oz serving of meat is about the same size as a deck of cards. One egg equals 1 oz. ? 2 servings of low-fat dairy each day. ? A serving of nuts, seeds, or beans 5 times each week. ? Heart-healthy fats. Healthy fats called Omega-3 fatty acids are found in foods such as flaxseeds and coldwater fish, like sardines, salmon, and mackerel.  Limit how much you eat of the following: ? Canned or prepackaged foods. ? Food that is high in trans fat, such as fried foods. ? Food that is high in saturated fat, such as fatty meat. ? Sweets, desserts, sugary drinks, and other foods with added sugar. ? Full-fat dairy products.  Do not salt foods before eating.  Try to eat at least 2 vegetarian meals each week.  Eat more home-cooked food and less restaurant, buffet, and fast food.  When eating at a restaurant, ask that your food be prepared with less salt or no salt, if possible. What foods are recommended? The items listed may not be a complete list. Talk with your dietitian about   what dietary choices are best for you. Grains Whole-grain or whole-wheat bread. Whole-grain or whole-wheat pasta. Brown rice. Oatmeal. Quinoa. Bulgur. Whole-grain and low-sodium cereals. Pita bread. Low-fat, low-sodium crackers. Whole-wheat flour tortillas. Vegetables Fresh or frozen vegetables (raw, steamed, roasted, or grilled). Low-sodium or reduced-sodium tomato and vegetable juice. Low-sodium or reduced-sodium tomato sauce and tomato paste. Low-sodium or reduced-sodium canned vegetables. Fruits All fresh, dried, or frozen fruit. Canned fruit in natural juice (without  added sugar). Meat and other protein foods Skinless chicken or turkey. Ground chicken or turkey. Pork with fat trimmed off. Fish and seafood. Egg whites. Dried beans, peas, or lentils. Unsalted nuts, nut butters, and seeds. Unsalted canned beans. Lean cuts of beef with fat trimmed off. Low-sodium, lean deli meat. Dairy Low-fat (1%) or fat-free (skim) milk. Fat-free, low-fat, or reduced-fat cheeses. Nonfat, low-sodium ricotta or cottage cheese. Low-fat or nonfat yogurt. Low-fat, low-sodium cheese. Fats and oils Soft margarine without trans fats. Vegetable oil. Low-fat, reduced-fat, or light mayonnaise and salad dressings (reduced-sodium). Canola, safflower, olive, soybean, and sunflower oils. Avocado. Seasoning and other foods Herbs. Spices. Seasoning mixes without salt. Unsalted popcorn and pretzels. Fat-free sweets. What foods are not recommended? The items listed may not be a complete list. Talk with your dietitian about what dietary choices are best for you. Grains Baked goods made with fat, such as croissants, muffins, or some breads. Dry pasta or rice meal packs. Vegetables Creamed or fried vegetables. Vegetables in a cheese sauce. Regular canned vegetables (not low-sodium or reduced-sodium). Regular canned tomato sauce and paste (not low-sodium or reduced-sodium). Regular tomato and vegetable juice (not low-sodium or reduced-sodium). Pickles. Olives. Fruits Canned fruit in a light or heavy syrup. Fried fruit. Fruit in cream or butter sauce. Meat and other protein foods Fatty cuts of meat. Ribs. Fried meat. Bacon. Sausage. Bologna and other processed lunch meats. Salami. Fatback. Hotdogs. Bratwurst. Salted nuts and seeds. Canned beans with added salt. Canned or smoked fish. Whole eggs or egg yolks. Chicken or turkey with skin. Dairy Whole or 2% milk, cream, and half-and-half. Whole or full-fat cream cheese. Whole-fat or sweetened yogurt. Full-fat cheese. Nondairy creamers. Whipped toppings.  Processed cheese and cheese spreads. Fats and oils Butter. Stick margarine. Lard. Shortening. Ghee. Bacon fat. Tropical oils, such as coconut, palm kernel, or palm oil. Seasoning and other foods Salted popcorn and pretzels. Onion salt, garlic salt, seasoned salt, table salt, and sea salt. Worcestershire sauce. Tartar sauce. Barbecue sauce. Teriyaki sauce. Soy sauce, including reduced-sodium. Steak sauce. Canned and packaged gravies. Fish sauce. Oyster sauce. Cocktail sauce. Horseradish that you find on the shelf. Ketchup. Mustard. Meat flavorings and tenderizers. Bouillon cubes. Hot sauce and Tabasco sauce. Premade or packaged marinades. Premade or packaged taco seasonings. Relishes. Regular salad dressings. Where to find more information:  National Heart, Lung, and Blood Institute: www.nhlbi.nih.gov  American Heart Association: www.heart.org Summary  The DASH eating plan is a healthy eating plan that has been shown to reduce high blood pressure (hypertension). It may also reduce your risk for type 2 diabetes, heart disease, and stroke.  With the DASH eating plan, you should limit salt (sodium) intake to 2,300 mg a day. If you have hypertension, you may need to reduce your sodium intake to 1,500 mg a day.  When on the DASH eating plan, aim to eat more fresh fruits and vegetables, whole grains, lean proteins, low-fat dairy, and heart-healthy fats.  Work with your health care provider or diet and nutrition specialist (dietitian) to adjust your eating plan to your   individual calorie needs. This information is not intended to replace advice given to you by your health care provider. Make sure you discuss any questions you have with your health care provider. Document Revised: 09/19/2017 Document Reviewed: 09/30/2016 Elsevier Patient Education  2020 [employer]  

## 2020-06-20 NOTE — Addendum Note (Signed)
Addended by: Rolena Infante on: 06/20/2020 02:05 PM   Modules accepted: Orders

## 2020-06-21 LAB — CBC WITH DIFFERENTIAL/PLATELET
Basophils Absolute: 0 10*3/uL (ref 0.0–0.2)
Basos: 0 %
EOS (ABSOLUTE): 0.2 10*3/uL (ref 0.0–0.4)
Eos: 4 %
Hematocrit: 43.4 % (ref 37.5–51.0)
Hemoglobin: 14.5 g/dL (ref 13.0–17.7)
Immature Grans (Abs): 0 10*3/uL (ref 0.0–0.1)
Immature Granulocytes: 0 %
Lymphocytes Absolute: 1 10*3/uL (ref 0.7–3.1)
Lymphs: 19 %
MCH: 31.3 pg (ref 26.6–33.0)
MCHC: 33.4 g/dL (ref 31.5–35.7)
MCV: 94 fL (ref 79–97)
Monocytes Absolute: 0.4 10*3/uL (ref 0.1–0.9)
Monocytes: 7 %
Neutrophils Absolute: 3.6 10*3/uL (ref 1.4–7.0)
Neutrophils: 70 %
Platelets: 174 10*3/uL (ref 150–450)
RBC: 4.64 x10E6/uL (ref 4.14–5.80)
RDW: 11.8 % (ref 11.6–15.4)
WBC: 5.2 10*3/uL (ref 3.4–10.8)

## 2020-06-21 LAB — CMP14+EGFR
ALT: 78 IU/L — ABNORMAL HIGH (ref 0–44)
AST: 64 IU/L — ABNORMAL HIGH (ref 0–40)
Albumin/Globulin Ratio: 1.4 (ref 1.2–2.2)
Albumin: 4.2 g/dL (ref 3.8–4.8)
Alkaline Phosphatase: 85 IU/L (ref 48–121)
BUN/Creatinine Ratio: 15 (ref 10–24)
BUN: 14 mg/dL (ref 8–27)
Bilirubin Total: 0.8 mg/dL (ref 0.0–1.2)
CO2: 28 mmol/L (ref 20–29)
Calcium: 9.6 mg/dL (ref 8.6–10.2)
Chloride: 100 mmol/L (ref 96–106)
Creatinine, Ser: 0.94 mg/dL (ref 0.76–1.27)
GFR calc Af Amer: 99 mL/min/{1.73_m2} (ref >59)
GFR calc non Af Amer: 86 mL/min/{1.73_m2} (ref >59)
Globulin, Total: 3.1 g/dL (ref 1.5–4.5)
Glucose: 141 mg/dL — ABNORMAL HIGH (ref 65–99)
Potassium: 4.2 mmol/L (ref 3.5–5.2)
Sodium: 140 mmol/L (ref 134–144)
Total Protein: 7.3 g/dL (ref 6.0–8.5)

## 2020-06-21 LAB — LIPID PANEL
Chol/HDL Ratio: 3.6 ratio (ref 0.0–5.0)
Cholesterol, Total: 138 mg/dL (ref 100–199)
HDL: 38 mg/dL — ABNORMAL LOW (ref >39)
LDL Chol Calc (NIH): 71 mg/dL (ref 0–99)
Triglycerides: 167 mg/dL — ABNORMAL HIGH (ref 0–149)
VLDL Cholesterol Cal: 29 mg/dL (ref 5–40)

## 2020-06-21 LAB — PSA, TOTAL AND FREE
PSA, Free Pct: 40 %
PSA, Free: 0.16 ng/mL
Prostate Specific Ag, Serum: 0.4 ng/mL (ref 0.0–4.0)

## 2020-06-23 ENCOUNTER — Other Ambulatory Visit: Payer: Self-pay | Admitting: Nurse Practitioner

## 2020-06-23 DIAGNOSIS — Z9109 Other allergy status, other than to drugs and biological substances: Secondary | ICD-10-CM

## 2020-09-12 ENCOUNTER — Ambulatory Visit (INDEPENDENT_AMBULATORY_CARE_PROVIDER_SITE_OTHER): Payer: PRIVATE HEALTH INSURANCE | Admitting: Nurse Practitioner

## 2020-09-12 ENCOUNTER — Other Ambulatory Visit: Payer: Self-pay

## 2020-09-12 ENCOUNTER — Encounter: Payer: Self-pay | Admitting: Nurse Practitioner

## 2020-09-12 VITALS — BP 134/92 | HR 76 | Temp 97.3°F | Resp 20 | Ht 70.0 in | Wt 306.0 lb

## 2020-09-12 DIAGNOSIS — F411 Generalized anxiety disorder: Secondary | ICD-10-CM

## 2020-09-12 DIAGNOSIS — I509 Heart failure, unspecified: Secondary | ICD-10-CM

## 2020-09-12 DIAGNOSIS — F5101 Primary insomnia: Secondary | ICD-10-CM

## 2020-09-12 DIAGNOSIS — Z23 Encounter for immunization: Secondary | ICD-10-CM | POA: Diagnosis not present

## 2020-09-12 DIAGNOSIS — I10 Essential (primary) hypertension: Secondary | ICD-10-CM

## 2020-09-12 DIAGNOSIS — C8339 Diffuse large B-cell lymphoma, extranodal and solid organ sites: Secondary | ICD-10-CM

## 2020-09-12 DIAGNOSIS — E785 Hyperlipidemia, unspecified: Secondary | ICD-10-CM

## 2020-09-12 LAB — CBC WITH DIFFERENTIAL/PLATELET
Basophils Absolute: 0 10*3/uL (ref 0.0–0.2)
Basos: 0 %
EOS (ABSOLUTE): 0.3 10*3/uL (ref 0.0–0.4)
Eos: 4 %
Hematocrit: 44.4 % (ref 37.5–51.0)
Hemoglobin: 14.7 g/dL (ref 13.0–17.7)
Immature Grans (Abs): 0 10*3/uL (ref 0.0–0.1)
Immature Granulocytes: 0 %
Lymphocytes Absolute: 1.2 10*3/uL (ref 0.7–3.1)
Lymphs: 18 %
MCH: 31.1 pg (ref 26.6–33.0)
MCHC: 33.1 g/dL (ref 31.5–35.7)
MCV: 94 fL (ref 79–97)
Monocytes Absolute: 0.9 10*3/uL (ref 0.1–0.9)
Monocytes: 12 %
Neutrophils Absolute: 4.6 10*3/uL (ref 1.4–7.0)
Neutrophils: 66 %
Platelets: 180 10*3/uL (ref 150–450)
RBC: 4.72 x10E6/uL (ref 4.14–5.80)
RDW: 11.8 % (ref 11.6–15.4)
WBC: 7 10*3/uL (ref 3.4–10.8)

## 2020-09-12 LAB — CMP14+EGFR
ALT: 74 IU/L — ABNORMAL HIGH (ref 0–44)
AST: 63 IU/L — ABNORMAL HIGH (ref 0–40)
Albumin/Globulin Ratio: 1.5 (ref 1.2–2.2)
Albumin: 4.5 g/dL (ref 3.8–4.8)
Alkaline Phosphatase: 92 IU/L (ref 44–121)
BUN/Creatinine Ratio: 13 (ref 10–24)
BUN: 12 mg/dL (ref 8–27)
Bilirubin Total: 0.6 mg/dL (ref 0.0–1.2)
CO2: 28 mmol/L (ref 20–29)
Calcium: 9.7 mg/dL (ref 8.6–10.2)
Chloride: 102 mmol/L (ref 96–106)
Creatinine, Ser: 0.9 mg/dL (ref 0.76–1.27)
GFR calc Af Amer: 105 mL/min/{1.73_m2} (ref >59)
GFR calc non Af Amer: 91 mL/min/{1.73_m2} (ref >59)
Globulin, Total: 3 g/dL (ref 1.5–4.5)
Glucose: 76 mg/dL (ref 65–99)
Potassium: 4.7 mmol/L (ref 3.5–5.2)
Sodium: 140 mmol/L (ref 134–144)
Total Protein: 7.5 g/dL (ref 6.0–8.5)

## 2020-09-12 LAB — LIPID PANEL
Chol/HDL Ratio: 4.3 ratio (ref 0.0–5.0)
Cholesterol, Total: 156 mg/dL (ref 100–199)
HDL: 36 mg/dL — ABNORMAL LOW (ref >39)
LDL Chol Calc (NIH): 74 mg/dL (ref 0–99)
Triglycerides: 286 mg/dL — ABNORMAL HIGH (ref 0–149)
VLDL Cholesterol Cal: 46 mg/dL — ABNORMAL HIGH (ref 5–40)

## 2020-09-12 MED ORDER — ATORVASTATIN CALCIUM 40 MG PO TABS: 40.0000 mg | ORAL_TABLET | Freq: Every day | ORAL | 1 refills | Status: DC

## 2020-09-12 MED ORDER — CARVEDILOL 3.125 MG PO TABS: 3.1250 mg | ORAL_TABLET | Freq: Two times a day (BID) | ORAL | 1 refills | Status: DC

## 2020-09-12 MED ORDER — DULOXETINE HCL 60 MG PO CPEP: 60.0000 mg | ORAL_CAPSULE | Freq: Every day | ORAL | 1 refills | Status: DC

## 2020-09-12 MED ORDER — ZOLPIDEM TARTRATE 5 MG PO TABS: 5.0000 mg | ORAL_TABLET | Freq: Every evening | ORAL | 3 refills | Status: DC | PRN

## 2020-09-12 MED ORDER — TRAZODONE HCL 50 MG PO TABS: 25.0000 mg | ORAL_TABLET | Freq: Every evening | ORAL | 1 refills | Status: DC | PRN

## 2020-09-12 MED ORDER — LOSARTAN POTASSIUM 25 MG PO TABS: 25.0000 mg | ORAL_TABLET | Freq: Every day | ORAL | 1 refills | Status: DC

## 2020-09-12 MED ORDER — CLONAZEPAM 0.5 MG PO TABS: 0.5000 mg | ORAL_TABLET | Freq: Two times a day (BID) | ORAL | 3 refills | Status: DC

## 2020-09-12 NOTE — Patient Instructions (Signed)
DASH Eating Plan DASH stands for "Dietary Approaches to Stop Hypertension." The DASH eating plan is a healthy eating plan that has been shown to reduce high blood pressure (hypertension). It may also reduce your risk for type 2 diabetes, heart disease, and stroke. The DASH eating plan may also help with weight loss. What are tips for following this plan?  General guidelines  Avoid eating more than 2,300 mg (milligrams) of salt (sodium) a day. If you have hypertension, you may need to reduce your sodium intake to 1,500 mg a day.  Limit alcohol intake to no more than 1 drink a day for nonpregnant women and 2 drinks a day for men. One drink equals 12 oz of beer, 5 oz of wine, or 1 oz of hard liquor.  Work with your health care provider to maintain a healthy body weight or to lose weight. Ask what an ideal weight is for you.  Get at least 30 minutes of exercise that causes your heart to beat faster (aerobic exercise) most days of the week. Activities may include walking, swimming, or biking.  Work with your health care provider or diet and nutrition specialist (dietitian) to adjust your eating plan to your individual calorie needs. Reading food labels   Check food labels for the amount of sodium per serving. Choose foods with less than 5 percent of the Daily Value of sodium. Generally, foods with less than 300 mg of sodium per serving fit into this eating plan.  To find whole grains, look for the word "whole" as the first word in the ingredient list. Shopping  Buy products labeled as "low-sodium" or "no salt added."  Buy fresh foods. Avoid canned foods and premade or frozen meals. Cooking  Avoid adding salt when cooking. Use salt-free seasonings or herbs instead of table salt or sea salt. Check with your health care provider or pharmacist before using salt substitutes.  Do not fry foods. Cook foods using healthy methods such as baking, boiling, grilling, and broiling instead.  Cook with  heart-healthy oils, such as olive, canola, soybean, or sunflower oil. Meal planning  Eat a balanced diet that includes: ? 5 or more servings of fruits and vegetables each day. At each meal, try to fill half of your plate with fruits and vegetables. ? Up to 6-8 servings of whole grains each day. ? Less than 6 oz of lean meat, poultry, or fish each day. A 3-oz serving of meat is about the same size as a deck of cards. One egg equals 1 oz. ? 2 servings of low-fat dairy each day. ? A serving of nuts, seeds, or beans 5 times each week. ? Heart-healthy fats. Healthy fats called Omega-3 fatty acids are found in foods such as flaxseeds and coldwater fish, like sardines, salmon, and mackerel.  Limit how much you eat of the following: ? Canned or prepackaged foods. ? Food that is high in trans fat, such as fried foods. ? Food that is high in saturated fat, such as fatty meat. ? Sweets, desserts, sugary drinks, and other foods with added sugar. ? Full-fat dairy products.  Do not salt foods before eating.  Try to eat at least 2 vegetarian meals each week.  Eat more home-cooked food and less restaurant, buffet, and fast food.  When eating at a restaurant, ask that your food be prepared with less salt or no salt, if possible. What foods are recommended? The items listed may not be a complete list. Talk with your dietitian about   what dietary choices are best for you. Grains Whole-grain or whole-wheat bread. Whole-grain or whole-wheat pasta. Brown rice. Oatmeal. Quinoa. Bulgur. Whole-grain and low-sodium cereals. Pita bread. Low-fat, low-sodium crackers. Whole-wheat flour tortillas. Vegetables Fresh or frozen vegetables (raw, steamed, roasted, or grilled). Low-sodium or reduced-sodium tomato and vegetable juice. Low-sodium or reduced-sodium tomato sauce and tomato paste. Low-sodium or reduced-sodium canned vegetables. Fruits All fresh, dried, or frozen fruit. Canned fruit in natural juice (without  added sugar). Meat and other protein foods Skinless chicken or turkey. Ground chicken or turkey. Pork with fat trimmed off. Fish and seafood. Egg whites. Dried beans, peas, or lentils. Unsalted nuts, nut butters, and seeds. Unsalted canned beans. Lean cuts of beef with fat trimmed off. Low-sodium, lean deli meat. Dairy Low-fat (1%) or fat-free (skim) milk. Fat-free, low-fat, or reduced-fat cheeses. Nonfat, low-sodium ricotta or cottage cheese. Low-fat or nonfat yogurt. Low-fat, low-sodium cheese. Fats and oils Soft margarine without trans fats. Vegetable oil. Low-fat, reduced-fat, or light mayonnaise and salad dressings (reduced-sodium). Canola, safflower, olive, soybean, and sunflower oils. Avocado. Seasoning and other foods Herbs. Spices. Seasoning mixes without salt. Unsalted popcorn and pretzels. Fat-free sweets. What foods are not recommended? The items listed may not be a complete list. Talk with your dietitian about what dietary choices are best for you. Grains Baked goods made with fat, such as croissants, muffins, or some breads. Dry pasta or rice meal packs. Vegetables Creamed or fried vegetables. Vegetables in a cheese sauce. Regular canned vegetables (not low-sodium or reduced-sodium). Regular canned tomato sauce and paste (not low-sodium or reduced-sodium). Regular tomato and vegetable juice (not low-sodium or reduced-sodium). Pickles. Olives. Fruits Canned fruit in a light or heavy syrup. Fried fruit. Fruit in cream or butter sauce. Meat and other protein foods Fatty cuts of meat. Ribs. Fried meat. Bacon. Sausage. Bologna and other processed lunch meats. Salami. Fatback. Hotdogs. Bratwurst. Salted nuts and seeds. Canned beans with added salt. Canned or smoked fish. Whole eggs or egg yolks. Chicken or turkey with skin. Dairy Whole or 2% milk, cream, and half-and-half. Whole or full-fat cream cheese. Whole-fat or sweetened yogurt. Full-fat cheese. Nondairy creamers. Whipped toppings.  Processed cheese and cheese spreads. Fats and oils Butter. Stick margarine. Lard. Shortening. Ghee. Bacon fat. Tropical oils, such as coconut, palm kernel, or palm oil. Seasoning and other foods Salted popcorn and pretzels. Onion salt, garlic salt, seasoned salt, table salt, and sea salt. Worcestershire sauce. Tartar sauce. Barbecue sauce. Teriyaki sauce. Soy sauce, including reduced-sodium. Steak sauce. Canned and packaged gravies. Fish sauce. Oyster sauce. Cocktail sauce. Horseradish that you find on the shelf. Ketchup. Mustard. Meat flavorings and tenderizers. Bouillon cubes. Hot sauce and Tabasco sauce. Premade or packaged marinades. Premade or packaged taco seasonings. Relishes. Regular salad dressings. Where to find more information:  National Heart, Lung, and Blood Institute: www.nhlbi.nih.gov  American Heart Association: www.heart.org Summary  The DASH eating plan is a healthy eating plan that has been shown to reduce high blood pressure (hypertension). It may also reduce your risk for type 2 diabetes, heart disease, and stroke.  With the DASH eating plan, you should limit salt (sodium) intake to 2,300 mg a day. If you have hypertension, you may need to reduce your sodium intake to 1,500 mg a day.  When on the DASH eating plan, aim to eat more fresh fruits and vegetables, whole grains, lean proteins, low-fat dairy, and heart-healthy fats.  Work with your health care provider or diet and nutrition specialist (dietitian) to adjust your eating plan to your   individual calorie needs. This information is not intended to replace advice given to you by your health care provider. Make sure you discuss any questions you have with your health care provider. Document Revised: 09/19/2017 Document Reviewed: 09/30/2016 Elsevier Patient Education  2020 [employer]  

## 2020-09-12 NOTE — Progress Notes (Signed)
Subjective:    Patient ID: Matthew Irwin, male    DOB: Nov 25, 1955, 64 y.o.   MRN: [id number]   Chief Complaint: No chief complaint on file.    HPI:  1. Primary hypertension BP Readings from Last 3 Encounters:  09/12/20 (!) 134/92  06/20/20 105/79  03/17/20 100/69  Does not check BP regularly. Takes medication as prescribed. Does not watch diet or avoid salt.    2. Chronic congestive heart failure, unspecified heart failure type ([place]) Denies chest pain, SOB, or leg swelling.   3. Hyperlipidemia with target LDL less than 100 Lab Results  Component Value Date   CHOL 138 06/20/2020   HDL 38 (L) 06/20/2020   LDLCALC 71 06/20/2020   TRIG 167 (H) 06/20/2020   CHOLHDL 3.6 06/20/2020  Does not avoid fried or fatty foods. States increased amount of fast food recently.    4. 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    5. 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 No complaints or concerns related.   6. Primary insomnia Takes medication as prescribed. No issues sleeping or staying asleep.   7. Severe obesity (BMI >= 40) (HCC) Wt Readings from Last 3 Encounters:  09/12/20 (!) 306 lb (138.8 kg)  06/20/20 (!) 302 lb (137 kg)  03/17/20 (!) 303 lb (137.4 kg)   BMI Readings from Last 3 Encounters:  09/12/20 43.91 kg/m  06/20/20 43.33 kg/m  03/17/20 43.48 kg/m   Denies formal exercise. Walks frequently with job.     Outpatient Encounter  Medications as of 09/12/2020  Medication Sig  . aspirin 81 MG tablet Take 81 mg by mouth at bedtime.   Marland Kitchen atorvastatin (LIPITOR) 40 MG tablet Take 1 tablet (40 mg total) by mouth daily at 6 PM.  . carvedilol (COREG) 3.125 MG tablet Take 1 tablet (3.125 mg total) by mouth 2 (two) times daily with a meal.  . cetirizine (ZYRTEC) 10 MG tablet TAKE 1 TABLET DAILY  . clonazePAM (KLONOPIN) 0.5 MG tablet Take 1 tablet (0.5 mg total) by mouth 2 (two) times daily.  . DULoxetine (CYMBALTA) 60 MG capsule Take 1 capsule (60 mg total) by mouth daily.  . fluticasone (FLONASE) 50 MCG/ACT nasal spray Place 2 sprays into both nostrils daily.  Marland Kitchen losartan (COZAAR) 25 MG tablet Take 1 tablet (25 mg total) by mouth daily.  . Multiple Vitamins-Minerals (CENTRUM SILVER ULTRA MENS) TABS Take 1 tablet by mouth daily.   . Olopatadine HCl (PATADAY OP) Apply to eye.  . Omega-3 Fatty Acids (FISH OIL) 1000 MG CAPS Take 1,000 mg by mouth 2 (two) times daily.   Marland Kitchen Propylene Glycol-Glycerin (SOOTHE) 0.6-0.6 % SOLN Apply 1 drop to eye daily as needed (dry eyes).  . traZODone (DESYREL) 50 MG tablet Take 0.5-1 tablets (25-50 mg total) by mouth at bedtime as needed for sleep.  Marland Kitchen zolpidem (AMBIEN) 5 MG tablet Take 1  tablet (5 mg total) by mouth at bedtime as needed. for sleep   No facility-administered encounter medications on file as of 09/12/2020.    Past Surgical History:  Procedure Laterality Date  . BACK SURGERY  07/2008   tumor removed off spine  . heart sack removed  07/2009  . HEMORRHOID SURGERY    . LEFT HEART CATH  10/10  . NASAL SINUS SURGERY    . PORT-A-CATH REMOVAL Left 12/13/2016   Procedure: MINOR REMOVAL PORT-A-CATH;  Surgeon: Aviva Signs, MD;  Location: [facility];  Service: General;  Laterality: Left;  . PORTACATH PLACEMENT    . TUMOR REMOVAL      Family History  Problem Relation Age of Onset  . Parkinson's disease Mother   . Turner syndrome Sister        questionable  . Colon cancer Neg Hx     New  complaints: No new complaints  Social history: Lives at home with wife, has daughter and grandchild to play with with. Works often.   Controlled substance contract: signed 09/12/2020     Review of Systems  Constitutional: Negative.   HENT: Negative.   Eyes: Negative.   Respiratory: Negative.   Cardiovascular: Negative.   Gastrointestinal: Negative.   Endocrine: Negative.   Genitourinary: Negative.   Musculoskeletal: Negative.   Skin: Negative.   Allergic/Immunologic: Negative.   Neurological: Negative.   Hematological: Negative.   Psychiatric/Behavioral: Negative.   All other systems reviewed and are negative.      Objective:   Physical Exam Vitals and nursing note reviewed.  Constitutional:      Appearance: Normal appearance.  HENT:     Head: Normocephalic and atraumatic.     Right Ear: Tympanic membrane, ear canal and external ear normal.     Left Ear: Tympanic membrane, ear canal and external ear normal.     Nose: Nose normal.     Mouth/Throat:     Mouth: Mucous membranes are moist.     Pharynx: Oropharynx is clear.  Eyes:     Extraocular Movements: Extraocular movements intact.     Conjunctiva/sclera: Conjunctivae normal.     Pupils: Pupils are equal, round, and reactive to light.  Cardiovascular:     Rate and Rhythm: Normal rate and regular rhythm.     Pulses: Normal pulses.     Heart sounds: Normal heart sounds.  Pulmonary:     Effort: Pulmonary effort is normal.     Breath sounds: Normal breath sounds.  Abdominal:     General: Abdomen is flat. Bowel sounds are normal.     Palpations: Abdomen is soft.  Musculoskeletal:        General: Normal range of motion.     Cervical back: Normal range of motion.  Skin:    General: Skin is warm and dry.     Capillary Refill: Capillary refill takes less than 2 seconds.  Neurological:     General: No focal deficit present.     Mental Status: He is alert and oriented to person, place, and time. Mental status is  at baseline.  Psychiatric:        Mood and Affect: Mood normal.        Behavior: Behavior normal.        Thought Content: Thought content normal.        Judgment: Judgment normal.    BP (!) 134/92   Pulse 76   Temp (!) 97.3 F (36.3 C) (Temporal)   Resp 20  Ht 5\' 10"  (1.778 m)   Wt (!) 306 lb (138.8 kg)   SpO2 99%   BMI 43.91 kg/m       Assessment & Plan:  Matthew Irwin comes in today with chief complaint of No chief complaint on file.   Diagnosis and orders addressed:  1. Primary hypertension Check blood pressure at home regularly and take medication as prescribed. Eat a heart healthy diet and avoid foods high in salt.   2. Chronic congestive heart failure, unspecified heart failure type ([place]) Report any chest pain or shortness of breath. Report any increased swelling in legs.    3. Hyperlipidemia with target LDL less than 100 Take medication as prescribed. Avoid foods that are fried and high in fat.   4. GAD (generalized anxiety disorder) Take medication as prescribed. Report any new or worsening symptoms.   5. 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   6. Primary insomnia Take medication as prescribed. Report any new or worsening symptoms.   7. Severe obesity (BMI >= 40) (HCC) Exercise regularly. Walking and swimming are great low-impact cardiovascular exercises that help lower blood pressure, cholesterol levels, and help you maintain a healthy weight.   Meds ordered this encounter  Medications  . carvedilol (COREG) 3.125 MG tablet    Sig: Take 1 tablet (3.125 mg total) by mouth 2 (two) times daily with a meal.    Dispense:  180 tablet    Refill:  1    Order Specific Question:   Supervising Provider    Answer:   Caryl Pina A [id number]  . losartan (COZAAR) 25 MG tablet    Sig: Take 1 tablet (25 mg total) by mouth daily.    Dispense:  90 tablet    Refill:  1    Order Specific Question:    Supervising Provider    Answer:   Caryl Pina A [id number]  . DULoxetine (CYMBALTA) 60 MG capsule    Sig: Take 1 capsule (60 mg total) by mouth daily.    Dispense:  90 capsule    Refill:  1    To replace lexapro is not to expensive    Order Specific Question:   Supervising Provider    Answer:   Caryl Pina A [[id number]]  . clonazePAM (KLONOPIN) 0.5 MG tablet    Sig: Take 1 tablet (0.5 mg total) by mouth 2 (two) times daily.    Dispense:  60 tablet    Refill:  3    Order Specific Question:   Supervising Provider    Answer:   Caryl Pina A [id number]  . atorvastatin (LIPITOR) 40 MG tablet    Sig: Take 1 tablet (40 mg total) by mouth daily at 6 PM.    Dispense:  90 tablet    Refill:  1    Order Specific Question:   Supervising Provider    Answer:   Caryl Pina A [id number]  . zolpidem (AMBIEN) 5 MG tablet    Sig: Take 1 tablet (5 mg total) by mouth at bedtime as needed. for sleep    Dispense:  30 tablet    Refill:  3    Order Specific Question:   Supervising Provider    Answer:   Caryl Pina A [id number]  . traZODone (DESYREL) 50 MG tablet    Sig: Take 0.5-1 tablets (25-50 mg total) by mouth at bedtime as needed for sleep.    Dispense:  90 tablet  Refill:  1    Order Specific Question:   Supervising Provider    Answer:   Caryl Pina A [id number]    Labs pending Health Maintenance reviewed Diet and exercise encouraged  Follow up plan: Follow up in 6 months.    Mary-Margaret Hassell Done, FNP

## 2020-11-30 ENCOUNTER — Other Ambulatory Visit: Payer: Self-pay | Admitting: Nurse Practitioner

## 2020-11-30 DIAGNOSIS — Z9109 Other allergy status, other than to drugs and biological substances: Secondary | ICD-10-CM

## 2020-12-13 ENCOUNTER — Other Ambulatory Visit: Payer: Self-pay

## 2020-12-13 ENCOUNTER — Ambulatory Visit (INDEPENDENT_AMBULATORY_CARE_PROVIDER_SITE_OTHER): Payer: PRIVATE HEALTH INSURANCE | Admitting: Nurse Practitioner

## 2020-12-13 ENCOUNTER — Encounter: Payer: Self-pay | Admitting: Nurse Practitioner

## 2020-12-13 VITALS — BP 95/70 | HR 80 | Temp 97.1°F | Resp 20 | Ht 70.0 in | Wt 304.0 lb

## 2020-12-13 DIAGNOSIS — I509 Heart failure, unspecified: Secondary | ICD-10-CM

## 2020-12-13 DIAGNOSIS — I10 Essential (primary) hypertension: Secondary | ICD-10-CM | POA: Diagnosis not present

## 2020-12-13 DIAGNOSIS — F411 Generalized anxiety disorder: Secondary | ICD-10-CM

## 2020-12-13 DIAGNOSIS — E785 Hyperlipidemia, unspecified: Secondary | ICD-10-CM | POA: Diagnosis not present

## 2020-12-13 DIAGNOSIS — F5101 Primary insomnia: Secondary | ICD-10-CM | POA: Diagnosis not present

## 2020-12-13 DIAGNOSIS — C8339 Diffuse large B-cell lymphoma, extranodal and solid organ sites: Secondary | ICD-10-CM

## 2020-12-13 MED ORDER — TRAZODONE HCL 50 MG PO TABS: 25.0000 mg | ORAL_TABLET | Freq: Every evening | ORAL | 1 refills | Status: DC | PRN

## 2020-12-13 MED ORDER — CARVEDILOL 3.125 MG PO TABS: 3.1250 mg | ORAL_TABLET | Freq: Two times a day (BID) | ORAL | 1 refills | Status: DC

## 2020-12-13 MED ORDER — CLONAZEPAM 0.5 MG PO TABS
0.5000 mg | ORAL_TABLET | Freq: Two times a day (BID) | ORAL | 3 refills | Status: DC
Start: 2020-12-13 — End: 2021-06-12

## 2020-12-13 MED ORDER — ATORVASTATIN CALCIUM 40 MG PO TABS: 40.0000 mg | ORAL_TABLET | Freq: Every day | ORAL | 1 refills | Status: DC

## 2020-12-13 MED ORDER — LOSARTAN POTASSIUM 25 MG PO TABS: 25.0000 mg | ORAL_TABLET | Freq: Every day | ORAL | 1 refills | Status: DC

## 2020-12-13 MED ORDER — DULOXETINE HCL 60 MG PO CPEP: 60.0000 mg | ORAL_CAPSULE | Freq: Every day | ORAL | 1 refills | Status: DC

## 2020-12-13 MED ORDER — ZOLPIDEM TARTRATE 5 MG PO TABS: 5.0000 mg | ORAL_TABLET | Freq: Every evening | ORAL | 3 refills | Status: DC | PRN

## 2020-12-13 NOTE — Progress Notes (Signed)
Subjective:    Patient ID: Matthew Irwin, male    DOB: [birth year], 65 y.o.   MRN: [id number]  Chief Complaint: Medical Management of Chronic Issues    HPI:  1. Primary hypertension No c/o chest pain, sob or headache. Does not check blood pressure at home. BP Readings from Last 3 Encounters:  09/12/20 (!) 134/92  06/20/20 105/79  03/17/20 100/69     2. Hyperlipidemia with target LDL less than 100 Does try to wtatch diet but does no exercise. Lab Results  Component Value Date   CHOL 156 09/12/2020   HDL 36 (L) 09/12/2020   LDLCALC 74 09/12/2020   TRIG 286 (H) 09/12/2020   CHOLHDL 4.3 09/12/2020   The 10-year ASCVD risk score Mikey Bussing DC Jr., et al., 2013) is: 7.8%   3. Chronic congestive heart failure, unspecified heart failure type ([place]) Has not seen  Cardiologist in awhile. He has nit felt the need to be seen, since he is not havig any problems.  4. Primary insomnia Takes ambien to sleep at night. Cannot sleep without it.  5. 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 Is on cymbalta and klonopin and is doing well on combination. GAD 7 : Generalized Anxiety Score 12/13/2020 06/20/2020 12/10/2019 09/07/2019  Nervous, Anxious, on Edge 0 0 1 0  Control/stop worrying 0 0 0 0  Worry too much - different things 0 0 0 0  Trouble relaxing 0 0 0 0  Restless 0 0 0 0  Easily annoyed or irritable 0 0 1 0  Afraid - awful might happen 0 0 0 0  Total GAD 7 Score 0 0 2 0  Anxiety Difficulty Not difficult at all Not difficult at all Somewhat difficult Not difficult at all     6. 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 He was graduated from [place] care on that day and was punted back to PCP for surveillance. Oncologist said no reason to do yearly chest xrays unless he is symptomatic of something.  7. Severe obesity (BMI >= 40) (HCC) No recent weight changes Wt Readings from Last 3  Encounters:  12/13/20 (!) 304 lb (137.9 kg)  09/12/20 (!) 306 lb (138.8 kg)  06/20/20 (!) 302 lb (137 kg)   BMI Readings from Last 3 Encounters:  12/13/20 43.62 kg/m  09/12/20 43.91 kg/m  06/20/20 43.33 kg/m       Outpatient Encounter Medications as of 12/13/2020  Medication Sig  . aspirin 81 MG tablet Take 81 mg by mouth at bedtime.   Marland Kitchen atorvastatin (LIPITOR) 40 MG tablet Take 1 tablet (40 mg total) by mouth daily at 6 PM.  . carvedilol (COREG) 3.125 MG tablet Take 1 tablet (3.125 mg total) by mouth 2 (two) times daily with a meal.  . cetirizine (ZYRTEC) 10 MG tablet TAKE 1 TABLET DAILY  . clonazePAM (KLONOPIN) 0.5 MG tablet Take 1 tablet (0.5 mg total) by mouth 2 (two) times daily.  . DULoxetine (CYMBALTA) 60 MG capsule Take 1 capsule (60 mg total) by mouth daily.  . fluticasone (FLONASE) 50 MCG/ACT nasal spray Place 2 sprays into both nostrils daily.  Marland Kitchen losartan (COZAAR) 25 MG tablet Take 1 tablet (25 mg total) by mouth daily.  . Multiple Vitamins-Minerals (CENTRUM SILVER ULTRA MENS) TABS Take 1 tablet by mouth daily.   . Olopatadine HCl (PATADAY OP) Apply to eye.  . Omega-3 Fatty Acids (FISH OIL) 1000 MG CAPS Take  1,000 mg by mouth 2 (two) times daily.   Marland Kitchen Propylene Glycol-Glycerin (SOOTHE) 0.6-0.6 % SOLN Apply 1 drop to eye daily as needed (dry eyes).  . traZODone (DESYREL) 50 MG tablet Take 0.5-1 tablets (25-50 mg total) by mouth at bedtime as needed for sleep.  Marland Kitchen zolpidem (AMBIEN) 5 MG tablet Take 1 tablet (5 mg total) by mouth at bedtime as needed. for sleep     Past Surgical History:  Procedure Laterality Date  . BACK SURGERY  07/2008   tumor removed off spine  . heart sack removed  07/2009  . HEMORRHOID SURGERY    . LEFT HEART CATH  10/10  . NASAL SINUS SURGERY    . PORT-A-CATH REMOVAL Left 12/13/2016   Procedure: MINOR REMOVAL PORT-A-CATH;  Surgeon: Aviva Signs, MD;  Location: [facility];  Service: General;  Laterality: Left;  . PORTACATH PLACEMENT    . TUMOR  REMOVAL      Family History  Problem Relation Age of Onset  . Parkinson's disease Mother   . Turner syndrome Sister        questionable  . Colon cancer Neg Hx     New complaints: Non etoday  Social history: Lives with wife  Controlled substance contract: 09/13/20    Review of Systems  Constitutional: Negative for diaphoresis.  Eyes: Negative for pain.  Respiratory: Negative for shortness of breath.   Cardiovascular: Negative for chest pain, palpitations and leg swelling.  Gastrointestinal: Negative for abdominal pain.  Endocrine: Negative for polydipsia.  Skin: Negative for rash.  Neurological: Negative for dizziness, weakness and headaches.  Hematological: Does not bruise/bleed easily.  All other systems reviewed and are negative.      Objective:   Physical Exam Vitals and nursing note reviewed.  Constitutional:      Appearance: Normal appearance. He is well-developed and well-nourished.  HENT:     Head: Normocephalic.     Nose: Nose normal.     Mouth/Throat:     Mouth: Oropharynx is clear and moist.  Eyes:     Extraocular Movements: EOM normal.     Pupils: Pupils are equal, round, and reactive to light.  Neck:     Thyroid: No thyroid mass or thyromegaly.     Vascular: No carotid bruit or JVD.     Trachea: Phonation normal.  Cardiovascular:     Rate and Rhythm: Normal rate and regular rhythm.  Pulmonary:     Effort: Pulmonary effort is normal. No respiratory distress.     Breath sounds: Normal breath sounds.  Abdominal:     General: Bowel sounds are normal. Aorta is normal.     Palpations: Abdomen is soft.     Tenderness: There is no abdominal tenderness.  Musculoskeletal:        General: Normal range of motion.     Cervical back: Normal range of motion and neck supple.  Lymphadenopathy:     Cervical: No cervical adenopathy.  Skin:    General: Skin is warm and dry.  Neurological:     Mental Status: He is alert and oriented to person, place, and  time.  Psychiatric:        Mood and Affect: Mood and affect normal.        Behavior: Behavior normal.        Thought Content: Thought content normal.        Judgment: Judgment normal.     BP 95/70   Pulse 80   Temp (!) 97.1 F (36.2 C) (  Temporal)   Resp 20   Ht _0  (1.778 m)   Wt (!) 304 lb (137.9 kg)   SpO2 95%   BMI 43.62 kg/m        Assessment & Plan:  Matthew Irwin comes in today with chief complaint of Medical Management of Chronic Issues   Diagnosis and orders addressed:  1. Primary hypertension Low sodium diet - CBC with Differential/Platelet - CMP14+EGFR - carvedilol (COREG) 3.125 MG tablet; Take 1 tablet (3.125 mg total) by mouth 2 (two) times daily with a meal.  Dispense: 180 tablet; Refill: 1 - losartan (COZAAR) 25 MG tablet; Take 1 tablet (25 mg total) by mouth daily.  Dispense: 90 tablet; Refill: 1  2. Hyperlipidemia with target LDL less than 100 Low fat diet - Lipid panel - atorvastatin (LIPITOR) 40 MG tablet; Take 1 tablet (40 mg total) by mouth daily at 6 PM.  Dispense: 90 tablet; Refill: 1  3. Chronic congestive heart failure, unspecified heart failure type ([place])  4. Primary insomnia Bedtime routine - zolpidem (AMBIEN) 5 MG tablet; Take 1 tablet (5 mg total) by mouth at bedtime as needed. for sleep  Dispense: 30 tablet; Refill: 3 - traZODone (DESYREL) 50 MG tablet; Take 0.5-1 tablets (25-50 mg total) by mouth at bedtime as needed for sleep.  Dispense: 90 tablet; Refill: 1  5. GAD (generalized anxiety disorder) Stress management - DULoxetine (CYMBALTA) 60 MG capsule; Take 1 capsule (60 mg total) by mouth daily.  Dispense: 90 capsule; Refill: 1 - clonazePAM (KLONOPIN) 0.5 MG tablet; Take 1 tablet (0.5 mg total) by mouth 2 (two) times daily.  Dispense: 60 tablet; Refill: 3  6. 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 Severe obesity (BMI >= 40) (HCC) Discussed  diet and exercise for person with BMI >25 Will recheck weight in 3-6 months    Labs pending Health Maintenance reviewed Diet and exercise encouraged  Follow up plan: 6 months   [place], FNP

## 2020-12-14 LAB — CBC WITH DIFFERENTIAL/PLATELET
Basophils Absolute: 0 10*3/uL (ref 0.0–0.2)
Basos: 1 %
EOS (ABSOLUTE): 0.3 10*3/uL (ref 0.0–0.4)
Eos: 5 %
Hematocrit: 44 % (ref 37.5–51.0)
Hemoglobin: 14.4 g/dL (ref 13.0–17.7)
Immature Grans (Abs): 0 10*3/uL (ref 0.0–0.1)
Immature Granulocytes: 0 %
Lymphocytes Absolute: 1.5 10*3/uL (ref 0.7–3.1)
Lymphs: 25 %
MCH: 30.6 pg (ref 26.6–33.0)
MCHC: 32.7 g/dL (ref 31.5–35.7)
MCV: 94 fL (ref 79–97)
Monocytes Absolute: 0.7 10*3/uL (ref 0.1–0.9)
Monocytes: 12 %
Neutrophils Absolute: 3.4 10*3/uL (ref 1.4–7.0)
Neutrophils: 57 %
Platelets: 203 10*3/uL (ref 150–450)
RBC: 4.7 x10E6/uL (ref 4.14–5.80)
RDW: 11.8 % (ref 11.6–15.4)
WBC: 5.9 10*3/uL (ref 3.4–10.8)

## 2020-12-14 LAB — CMP14+EGFR
ALT: 55 IU/L — ABNORMAL HIGH (ref 0–44)
AST: 48 IU/L — ABNORMAL HIGH (ref 0–40)
Albumin/Globulin Ratio: 1.5 (ref 1.2–2.2)
Albumin: 4.3 g/dL (ref 3.8–4.8)
Alkaline Phosphatase: 85 IU/L (ref 44–121)
BUN/Creatinine Ratio: 14 (ref 10–24)
BUN: 14 mg/dL (ref 8–27)
Bilirubin Total: 0.5 mg/dL (ref 0.0–1.2)
CO2: 26 mmol/L (ref 20–29)
Calcium: 9.4 mg/dL (ref 8.6–10.2)
Chloride: 98 mmol/L (ref 96–106)
Creatinine, Ser: 1.02 mg/dL (ref 0.76–1.27)
GFR calc Af Amer: 90 mL/min/{1.73_m2} (ref >59)
GFR calc non Af Amer: 78 mL/min/{1.73_m2} (ref >59)
Globulin, Total: 2.9 g/dL (ref 1.5–4.5)
Glucose: 103 mg/dL — ABNORMAL HIGH (ref 65–99)
Potassium: 4.6 mmol/L (ref 3.5–5.2)
Sodium: 139 mmol/L (ref 134–144)
Total Protein: 7.2 g/dL (ref 6.0–8.5)

## 2020-12-14 LAB — LIPID PANEL
Chol/HDL Ratio: 4.9 ratio (ref 0.0–5.0)
Cholesterol, Total: 171 mg/dL (ref 100–199)
HDL: 35 mg/dL — ABNORMAL LOW (ref >39)
LDL Chol Calc (NIH): 87 mg/dL (ref 0–99)
Triglycerides: 294 mg/dL — ABNORMAL HIGH (ref 0–149)
VLDL Cholesterol Cal: 49 mg/dL — ABNORMAL HIGH (ref 5–40)

## 2020-12-14 LAB — LACTATE DEHYDROGENASE: LDH: 171 IU/L (ref 121–224)

## 2020-12-14 LAB — BETA 2 MICROGLOBULIN, SERUM: Beta-2: 1.9 mg/L (ref 0.6–2.4)

## 2021-01-19 ENCOUNTER — Other Ambulatory Visit: Payer: Self-pay | Admitting: Nurse Practitioner

## 2021-01-19 DIAGNOSIS — F5101 Primary insomnia: Secondary | ICD-10-CM

## 2021-03-09 ENCOUNTER — Other Ambulatory Visit: Payer: Self-pay | Admitting: Nurse Practitioner

## 2021-03-09 DIAGNOSIS — F411 Generalized anxiety disorder: Secondary | ICD-10-CM

## 2021-04-02 ENCOUNTER — Other Ambulatory Visit: Payer: Self-pay | Admitting: Nurse Practitioner

## 2021-04-02 DIAGNOSIS — F5101 Primary insomnia: Secondary | ICD-10-CM

## 2021-05-02 ENCOUNTER — Other Ambulatory Visit: Payer: Self-pay | Admitting: Nurse Practitioner

## 2021-05-02 DIAGNOSIS — F5101 Primary insomnia: Secondary | ICD-10-CM

## 2021-06-04 ENCOUNTER — Other Ambulatory Visit: Payer: Self-pay | Admitting: Nurse Practitioner

## 2021-06-04 DIAGNOSIS — Z9109 Other allergy status, other than to drugs and biological substances: Secondary | ICD-10-CM

## 2021-06-12 ENCOUNTER — Ambulatory Visit (INDEPENDENT_AMBULATORY_CARE_PROVIDER_SITE_OTHER): Payer: PRIVATE HEALTH INSURANCE | Admitting: Nurse Practitioner

## 2021-06-12 ENCOUNTER — Encounter: Payer: Self-pay | Admitting: Nurse Practitioner

## 2021-06-12 ENCOUNTER — Other Ambulatory Visit: Payer: Self-pay

## 2021-06-12 VITALS — BP 101/73 | HR 72 | Temp 97.4°F | Resp 20 | Ht 70.0 in | Wt 302.0 lb

## 2021-06-12 DIAGNOSIS — Z23 Encounter for immunization: Secondary | ICD-10-CM | POA: Diagnosis not present

## 2021-06-12 DIAGNOSIS — E785 Hyperlipidemia, unspecified: Secondary | ICD-10-CM

## 2021-06-12 DIAGNOSIS — F411 Generalized anxiety disorder: Secondary | ICD-10-CM | POA: Diagnosis not present

## 2021-06-12 DIAGNOSIS — I10 Essential (primary) hypertension: Secondary | ICD-10-CM

## 2021-06-12 DIAGNOSIS — I509 Heart failure, unspecified: Secondary | ICD-10-CM

## 2021-06-12 DIAGNOSIS — C8339 Diffuse large B-cell lymphoma, extranodal and solid organ sites: Secondary | ICD-10-CM

## 2021-06-12 DIAGNOSIS — F5101 Primary insomnia: Secondary | ICD-10-CM

## 2021-06-12 MED ORDER — CLONAZEPAM 0.5 MG PO TABS: 0.5000 mg | ORAL_TABLET | Freq: Two times a day (BID) | ORAL | 5 refills | Status: DC

## 2021-06-12 MED ORDER — DULOXETINE HCL 60 MG PO CPEP: 60.0000 mg | ORAL_CAPSULE | Freq: Every day | ORAL | 1 refills | Status: DC

## 2021-06-12 MED ORDER — ZOLPIDEM TARTRATE 5 MG PO TABS: 5.0000 mg | ORAL_TABLET | Freq: Every evening | ORAL | 5 refills | Status: DC | PRN

## 2021-06-12 MED ORDER — ATORVASTATIN CALCIUM 40 MG PO TABS
40.0000 mg | ORAL_TABLET | Freq: Every day | ORAL | 1 refills | Status: DC
Start: 2021-06-12 — End: 2021-12-11

## 2021-06-12 MED ORDER — CARVEDILOL 3.125 MG PO TABS: 3.1250 mg | ORAL_TABLET | Freq: Two times a day (BID) | ORAL | 1 refills | Status: DC

## 2021-06-12 MED ORDER — LOSARTAN POTASSIUM 25 MG PO TABS: 25.0000 mg | ORAL_TABLET | Freq: Every day | ORAL | 1 refills | Status: DC

## 2021-06-12 MED ORDER — TRAZODONE HCL 50 MG PO TABS: 25.0000 mg | ORAL_TABLET | Freq: Every evening | ORAL | 1 refills | Status: DC | PRN

## 2021-06-12 NOTE — Progress Notes (Signed)
Subjective:    Patient ID: Matthew Irwin, male    DOB: [birth year], 65 y.o.   MRN: [id number]   Chief Complaint: medical management of chronic issues     HPI:  1. Primary hypertension No c/o chest pain, sob or headache, does not check blood pressure at home. BP Readings from Last 3 Encounters:  12/13/20 95/70  09/12/20 (!) 134/92  06/20/20 105/79     2. Hyperlipidemia with target LDL less than 100 Doe snot really watch diet and does no dedicated exercise. Lab Results  Component Value Date   CHOL 171 12/13/2020   HDL 35 (L) 12/13/2020   LDLCALC 87 12/13/2020   TRIG 294 (H) 12/13/2020   CHOLHDL 4.9 12/13/2020     3. Chronic congestive heart failure, unspecified heart failure type [facility]) He has not seen his cardiologist in several years.   4. GAD (generalized anxiety disorder) Is on klonopin BID. Gets very anxious if does not take. Is on cymbalta which also helps. GAD 7 : Generalized Anxiety Score 06/12/2021 12/13/2020 06/20/2020 12/10/2019  Nervous, Anxious, on Edge 0 0 0 1  Control/stop worrying 0 0 0 0  Worry too much - different things 0 0 0 0  Trouble relaxing 0 0 0 0  Restless 0 0 0 0  Easily annoyed or irritable 0 0 0 1  Afraid - awful might happen 0 0 0 0  Total GAD 7 Score 0 0 0 2  Anxiety Difficulty Not difficult at all Not difficult at all Not difficult at all Somewhat difficult      5. Primary insomnia Is on combination of trazadone and ambien. Sleeps about 6 hours at night.  6. 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 He has had no reoccurenece or metatasis. He denies any back pain.  7. Severe obesity (BMI >= 40) (HCC) No recent weight changes Wt Readings from Last 3 Encounters:  06/12/21 (!) 302 lb (137 kg)  12/13/20 (!) 304 lb (137.9 kg)  09/12/20 (!) 306 lb (138.8 kg)   BMI Readings from Last 3 Encounters:  06/12/21 43.33 kg/m  12/13/20 43.62 kg/m  09/12/20 43.91 kg/m      Outpatient  Encounter Medications as of 06/12/2021  Medication Sig   aspirin 81 MG tablet Take 81 mg by mouth at bedtime.   atorvastatin (LIPITOR) 40 MG tablet Take 1 tablet (40 mg total) by mouth daily at 6 PM.   carvedilol (COREG) 3.125 MG tablet Take 1 tablet (3.125 mg total) by mouth 2 (two) times daily with a meal.   cetirizine (ZYRTEC) 10 MG tablet TAKE 1 TABLET DAILY   clonazePAM (KLONOPIN) 0.5 MG tablet Take 1 tablet (0.5 mg total) by mouth 2 (two) times daily.   DULoxetine (CYMBALTA) 60 MG capsule TAKE 1 CAPSULE ONCE DAILY   fluticasone (FLONASE) 50 MCG/ACT nasal spray Place 2 sprays into both nostrils daily.   losartan (COZAAR) 25 MG tablet Take 1 tablet (25 mg total) by mouth daily.   Multiple Vitamins-Minerals (CENTRUM SILVER ULTRA MENS) TABS Take 1 tablet by mouth daily.   Olopatadine HCl (PATADAY OP) Apply to eye.   Omega-3 Fatty Acids (FISH OIL) 1000 MG CAPS Take 1,000 mg by mouth 2 (two) times daily.   Propylene Glycol-Glycerin 0.6-0.6 % SOLN Apply 1 drop to eye daily as needed (dry eyes).   traZODone (DESYREL) 50 MG tablet Take 0.5-1 tablets (25-50 mg total) by mouth at bedtime as needed for sleep.  zolpidem (AMBIEN) 5 MG tablet Take 1 tablet at bedtime as needed for sleep   No facility-administered encounter medications on file as of 06/12/2021.    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  Eyes:  Negative for pain.  Respiratory:  Negative for shortness of breath.   Cardiovascular:  Negative for chest pain, palpitations and leg swelling.  Gastrointestinal:  Negative for abdominal pain.  Endocrine: Negative for polydipsia.  Skin:  Negative for rash.  Neurological:  Negative for dizziness, weakness and headaches.  Hematological:  Does not bruise/bleed easily.  All other systems reviewed and are negative.     Objective:   Physical Exam Vitals and nursing note reviewed.  Constitutional:      Appearance: Normal appearance. He is well-developed.  HENT:     Head: Normocephalic.     Nose: Nose normal.  Eyes:     Pupils: Pupils are equal, round, and reactive to light.  Neck:     Thyroid: No thyroid mass or thyromegaly.     Vascular: No carotid bruit or JVD.     Trachea: Phonation normal.  Cardiovascular:     Rate and Rhythm: Normal rate and regular rhythm.  Pulmonary:     Effort: Pulmonary effort is normal. No respiratory distress.     Breath sounds: Normal breath sounds.  Abdominal:     General: Bowel sounds are normal.     Palpations: Abdomen is soft.     Tenderness: There is no abdominal tenderness.  Musculoskeletal:        General: Normal range of motion.     Cervical back: Normal range of motion and neck supple.  Lymphadenopathy:     Cervical: No cervical adenopathy.  Skin:    General: Skin is warm and dry.  Neurological:     Mental Status: He is alert and oriented to person, place, and time.  Psychiatric:        Behavior: Behavior normal.        Thought Content: Thought content normal.        Judgment: Judgment normal.    BP 101/73   Pulse 72   Temp (!) 97.4 F (36.3 C) (Temporal)   Resp 20   Ht 5' 10" (1.778 m)   Wt (!) 302 lb (137 kg)   SpO2 96%   BMI 43.33 kg/m        Assessment & Plan:  DEONTREY MASSI comes in today  with chief complaint of Medical Management of Chronic Issues   Diagnosis and orders addressed:  1. Primary hypertension Low sodium diet - carvedilol (COREG) 3.125 MG tablet; Take 1  tablet (3.125 mg total) by mouth 2 (two) times daily with a meal.  Dispense: 180 tablet; Refill: 1 - losartan (COZAAR) 25 MG tablet; Take 1 tablet (25 mg total) by mouth daily.  Dispense: 90 tablet; Refill: 1 - CBC with Differential/Platelet - CMP14+EGFR  2. Hyperlipidemia with target LDL less than 100 Low fat diet - atorvastatin (LIPITOR) 40 MG tablet; Take 1 tablet (40 mg total) by mouth daily at 6 PM.  Dispense: 90 tablet; Refill: 1 - Lipid panel  3. Chronic congestive heart failure, unspecified heart failure type [facility]) Will see cardiologist if needed  4. GAD (generalized anxiety disorder) Stress management - DULoxetine (CYMBALTA) 60 MG capsule; Take 1 capsule (60 mg total) by mouth daily.  Dispense: 90 capsule; Refill: 1 - clonazePAM (KLONOPIN) 0.5 MG tablet; Take 1 tablet (0.5 mg total) by mouth 2 (two) times daily.  Dispense: 60 tablet; Refill: 5  5. Primary insomnia Bedtime routine - zolpidem (AMBIEN) 5 MG tablet; Take 1 tablet (5 mg total) by mouth at bedtime as needed. for sleep  Dispense: 30 tablet; Refill: 5 - traZODone (DESYREL) 50 MG tablet; Take 0.5-1 tablets (25-50 mg total) by mouth at bedtime as needed for sleep.  Dispense: 90 tablet; Refill: 1  6. 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 Severe obesity (BMI >= 40) (HCC) Discussed diet and exercise for person with BMI >25 Will recheck weight in 3-6 months    Labs pending Health Maintenance reviewed Diet and exercise encouraged  Follow up plan: 6 months   [place], FNP

## 2021-06-12 NOTE — Patient Instructions (Signed)
Insomnia Insomnia is a sleep disorder that makes it difficult to fall asleep or stay asleep. Insomnia can cause fatigue, low energy, difficulty concentrating, moodswings, and poor performance at work or school. There are three different ways to classify insomnia: Difficulty falling asleep. Difficulty staying asleep. Waking up too early in the morning. Any type of insomnia can be long-term (chronic) or short-term (acute). Both are common. Short-term insomnia usually lasts for three months or less. Chronic insomnia occurs at least three times a week for longer than threemonths. What are the causes? Insomnia may be caused by another condition, situation, or substance, such as: Anxiety. Certain medicines. Gastroesophageal reflux disease (GERD) or other gastrointestinal conditions. Asthma or other breathing conditions. Restless legs syndrome, sleep apnea, or other sleep disorders. Chronic pain. Menopause. Stroke. Abuse of alcohol, tobacco, or illegal drugs. Mental health conditions, such as depression. Caffeine. Neurological disorders, such as Alzheimer's disease. An overactive thyroid (hyperthyroidism). Sometimes, the cause of insomnia may not be known. What increases the risk? Risk factors for insomnia include: Gender. Women are affected more often than men. Age. Insomnia is more common as you get older. Stress. Lack of exercise. Irregular work schedule or working night shifts. Traveling between different time zones. Certain medical and mental health conditions. What are the signs or symptoms? If you have insomnia, the main symptom is having trouble falling asleep or having trouble staying asleep. This may lead to other symptoms, such as: Feeling fatigued or having low energy. Feeling nervous about going to sleep. Not feeling rested in the morning. Having trouble concentrating. Feeling irritable, anxious, or depressed. How is this diagnosed? This condition may be diagnosed based  on: Your symptoms and medical history. Your health care provider may ask about: Your sleep habits. Any medical conditions you have. Your mental health. A physical exam. How is this treated? Treatment for insomnia depends on the cause. Treatment may focus on treating an underlying condition that is causing insomnia. Treatment may also include: Medicines to help you sleep. Counseling or therapy. Lifestyle adjustments to help you sleep better. Follow these instructions at home: Eating and drinking  Limit or avoid alcohol, caffeinated beverages, and cigarettes, especially close to bedtime. These can disrupt your sleep. Do not eat a large meal or eat spicy foods right before bedtime. This can lead to digestive discomfort that can make it hard for you to sleep.  Sleep habits  Keep a sleep diary to help you and your health care provider figure out what could be causing your insomnia. Write down: When you sleep. When you wake up during the night. How well you sleep. How rested you feel the next day. Any side effects of medicines you are taking. What you eat and drink. Make your bedroom a dark, comfortable place where it is easy to fall asleep. Put up shades or blackout curtains to block light from outside. Use a white noise machine to block noise. Keep the temperature cool. Limit screen use before bedtime. This includes: Watching TV. Using your smartphone, tablet, or computer. Stick to a routine that includes going to bed and waking up at the same times every day and night. This can help you fall asleep faster. Consider making a quiet activity, such as reading, part of your nighttime routine. Try to avoid taking naps during the day so that you sleep better at night. Get out of bed if you are still awake after 15 minutes of trying to sleep. Keep the lights down, but try reading or doing a quiet   activity. When you feel sleepy, go back to bed.  General instructions Take over-the-counter  and prescription medicines only as told by your health care provider. Exercise regularly, as told by your health care provider. Avoid exercise starting several hours before bedtime. Use relaxation techniques to manage stress. Ask your health care provider to suggest some techniques that may work well for you. These may include: Breathing exercises. Routines to release muscle tension. Visualizing peaceful scenes. Make sure that you drive carefully. Avoid driving if you feel very sleepy. Keep all follow-up visits as told by your health care provider. This is important. Contact a health care provider if: You are tired throughout the day. You have trouble in your daily routine due to sleepiness. You continue to have sleep problems, or your sleep problems get worse. Get help right away if: You have serious thoughts about hurting yourself or someone else. If you ever feel like you may hurt yourself or others, or have thoughts about taking your own life, get help right away. You can go to your nearest emergency department or call: Your local emergency services (911 in the [place] A suicide crisis helpline, such as the National Suicide Prevention Lifeline at 1-800-273-8255. This is open 24 hours a day. Summary Insomnia is a sleep disorder that makes it difficult to fall asleep or stay asleep. Insomnia can be long-term (chronic) or short-term (acute). Treatment for insomnia depends on the cause. Treatment may focus on treating an underlying condition that is causing insomnia. Keep a sleep diary to help you and your health care provider figure out what could be causing your insomnia. This information is not intended to replace advice given to you by your health care provider. Make sure you discuss any questions you have with your healthcare provider. Document Revised: 08/17/2020 Document Reviewed: 08/17/2020 Elsevier Patient Education  2022 [employer] Inc[employer]  

## 2021-06-12 NOTE — Addendum Note (Signed)
Addended by: Rolena Infante on: 06/12/2021 01:00 PM   Modules accepted: Orders

## 2021-06-13 LAB — CMP14+EGFR
ALT: 56 IU/L — ABNORMAL HIGH (ref 0–44)
AST: 51 IU/L — ABNORMAL HIGH (ref 0–40)
Albumin/Globulin Ratio: 1.3 (ref 1.2–2.2)
Albumin: 4.1 g/dL (ref 3.8–4.8)
Alkaline Phosphatase: 79 IU/L (ref 44–121)
BUN/Creatinine Ratio: 18 (ref 10–24)
BUN: 17 mg/dL (ref 8–27)
Bilirubin Total: 0.7 mg/dL (ref 0.0–1.2)
CO2: 27 mmol/L (ref 20–29)
Calcium: 9.4 mg/dL (ref 8.6–10.2)
Chloride: 99 mmol/L (ref 96–106)
Creatinine, Ser: 0.94 mg/dL (ref 0.76–1.27)
Globulin, Total: 3.1 g/dL (ref 1.5–4.5)
Glucose: 124 mg/dL — ABNORMAL HIGH (ref 65–99)
Potassium: 4 mmol/L (ref 3.5–5.2)
Sodium: 138 mmol/L (ref 134–144)
Total Protein: 7.2 g/dL (ref 6.0–8.5)
eGFR: 91 mL/min/{1.73_m2} (ref >59)

## 2021-06-13 LAB — CBC WITH DIFFERENTIAL/PLATELET
Basophils Absolute: 0 10*3/uL (ref 0.0–0.2)
Basos: 1 %
EOS (ABSOLUTE): 0.3 10*3/uL (ref 0.0–0.4)
Eos: 5 %
Hematocrit: 41.2 % (ref 37.5–51.0)
Hemoglobin: 13.9 g/dL (ref 13.0–17.7)
Immature Grans (Abs): 0 10*3/uL (ref 0.0–0.1)
Immature Granulocytes: 0 %
Lymphocytes Absolute: 1.4 10*3/uL (ref 0.7–3.1)
Lymphs: 26 %
MCH: 31.7 pg (ref 26.6–33.0)
MCHC: 33.7 g/dL (ref 31.5–35.7)
MCV: 94 fL (ref 79–97)
Monocytes Absolute: 0.6 10*3/uL (ref 0.1–0.9)
Monocytes: 12 %
Neutrophils Absolute: 3 10*3/uL (ref 1.4–7.0)
Neutrophils: 56 %
Platelets: 173 10*3/uL (ref 150–450)
RBC: 4.39 x10E6/uL (ref 4.14–5.80)
RDW: 11.8 % (ref 11.6–15.4)
WBC: 5.4 10*3/uL (ref 3.4–10.8)

## 2021-06-13 LAB — LIPID PANEL
Chol/HDL Ratio: 3.9 ratio (ref 0.0–5.0)
Cholesterol, Total: 140 mg/dL (ref 100–199)
HDL: 36 mg/dL — ABNORMAL LOW (ref >39)
LDL Chol Calc (NIH): 74 mg/dL (ref 0–99)
Triglycerides: 174 mg/dL — ABNORMAL HIGH (ref 0–149)
VLDL Cholesterol Cal: 30 mg/dL (ref 5–40)

## 2021-09-27 ENCOUNTER — Ambulatory Visit (INDEPENDENT_AMBULATORY_CARE_PROVIDER_SITE_OTHER): Payer: PRIVATE HEALTH INSURANCE | Admitting: Family Medicine

## 2021-09-27 ENCOUNTER — Encounter: Payer: Self-pay | Admitting: Family Medicine

## 2021-09-27 DIAGNOSIS — J0100 Acute maxillary sinusitis, unspecified: Secondary | ICD-10-CM

## 2021-09-27 MED ORDER — CEFDINIR 300 MG PO CAPS: 300.0000 mg | ORAL_CAPSULE | Freq: Two times a day (BID) | ORAL | 0 refills | Status: DC

## 2021-09-27 NOTE — Progress Notes (Signed)
Virtual Visit via telephone Note  I connected with Matthew Irwin on 09/27/21 at 1550 by telephone and verified that I am speaking with the correct person using two identifiers. Matthew Irwin is currently located at home and patient are currently with her during visit. The provider, Fransisca Kaufmann Sueanne Maniaci, MD is located in their office at time of visit.  Call ended at 1555  I discussed the limitations, risks, security and privacy concerns of performing an evaluation and management service by telephone and the availability of in person appointments. I also discussed with the patient that there may be a patient responsible charge related to this service. The patient expressed understanding and agreed to proceed.   History and Present Illness: Patient is calling in for sinus congestion and pressure.  He has postnasal drip.  He has been dealing with it for 2 weeks.  At first it was sore throat and improved but then worsening again.  He denies fevers or chills or SOB or wheezing. He has used mucinex dm.  He does also take nyquil.   1. Acute non-recurrent maxillary sinusitis     Outpatient Encounter Medications as of 09/27/2021  Medication Sig   cefdinir (OMNICEF) 300 MG capsule Take 1 capsule (300 mg total) by mouth 2 (two) times daily. 1 po BID   aspirin 81 MG tablet Take 81 mg by mouth at bedtime.   atorvastatin (LIPITOR) 40 MG tablet Take 1 tablet (40 mg total) by mouth daily at 6 PM.   carvedilol (COREG) 3.125 MG tablet Take 1 tablet (3.125 mg total) by mouth 2 (two) times daily with a meal.   cetirizine (ZYRTEC) 10 MG tablet TAKE 1 TABLET DAILY   clonazePAM (KLONOPIN) 0.5 MG tablet Take 1 tablet (0.5 mg total) by mouth 2 (two) times daily.   DULoxetine (CYMBALTA) 60 MG capsule Take 1 capsule (60 mg total) by mouth daily.   fluticasone (FLONASE) 50 MCG/ACT nasal spray Place 2 sprays into both nostrils daily.   losartan (COZAAR) 25 MG tablet Take 1 tablet (25 mg total) by mouth daily.   Multiple  Vitamins-Minerals (CENTRUM SILVER ULTRA MENS) TABS Take 1 tablet by mouth daily.   Olopatadine HCl (PATADAY OP) Apply to eye.   Omega-3 Fatty Acids (FISH OIL) 1000 MG CAPS Take 1,000 mg by mouth 2 (two) times daily.   Propylene Glycol-Glycerin 0.6-0.6 % SOLN Apply 1 drop to eye daily as needed (dry eyes).   traZODone (DESYREL) 50 MG tablet Take 0.5-1 tablets (25-50 mg total) by mouth at bedtime as needed for sleep.   zolpidem (AMBIEN) 5 MG tablet Take 1 tablet (5 mg total) by mouth at bedtime as needed. for sleep   No facility-administered encounter medications on file as of 09/27/2021.    Review of Systems  Constitutional:  Negative for chills and fever.  HENT:  Positive for congestion, postnasal drip, rhinorrhea, sinus pressure and sinus pain. Negative for ear discharge, ear pain, sneezing, sore throat and voice change.   Eyes:  Negative for pain, discharge, redness and visual disturbance.  Respiratory:  Positive for cough. Negative for shortness of breath and wheezing.   Cardiovascular:  Negative for chest pain and leg swelling.  Musculoskeletal:  Negative for gait problem.  Skin:  Negative for rash.  All other systems reviewed and are negative.  Observations/Objective: Patient sounds comfortable and in no acute distress.  Assessment and Plan: Problem List Items Addressed This Visit   None Visit Diagnoses     Acute non-recurrent maxillary  sinusitis    -  Primary   Relevant Medications   cefdinir (OMNICEF) 300 MG capsule       Will treat a sinus infection, sounds like he has been dealing with it for some time and is not improving, sent cefdinir for him.  He is allergic to amoxicillin. Follow up plan: Return if symptoms worsen or fail to improve.     I discussed the assessment and treatment plan with the patient. The patient was provided an opportunity to ask questions and all were answered. The patient agreed with the plan and demonstrated an understanding of the  instructions.   The patient was advised to call back or seek an in-person evaluation if the symptoms worsen or if the condition fails to improve as anticipated.  The above assessment and management plan was discussed with the patient. The patient verbalized understanding of and has agreed to the management plan. Patient is aware to call the clinic if symptoms persist or worsen. Patient is aware when to return to the clinic for a follow-up visit. Patient educated on when it is appropriate to go to the emergency department.    I provided 5 minutes of non-face-to-face time during this encounter.    Worthy Rancher, MD

## 2021-10-16 ENCOUNTER — Other Ambulatory Visit: Payer: Self-pay | Admitting: Nurse Practitioner

## 2021-10-16 DIAGNOSIS — F5101 Primary insomnia: Secondary | ICD-10-CM

## 2021-11-27 ENCOUNTER — Encounter: Payer: Self-pay | Admitting: Family Medicine

## 2021-11-27 ENCOUNTER — Ambulatory Visit (INDEPENDENT_AMBULATORY_CARE_PROVIDER_SITE_OTHER): Payer: 59 | Admitting: Family Medicine

## 2021-11-27 DIAGNOSIS — J069 Acute upper respiratory infection, unspecified: Secondary | ICD-10-CM | POA: Diagnosis not present

## 2021-11-27 MED ORDER — GUAIFENESIN ER 600 MG PO TB12: 600.0000 mg | ORAL_TABLET | Freq: Two times a day (BID) | ORAL | 0 refills | Status: AC

## 2021-11-27 MED ORDER — PREDNISONE 20 MG PO TABS: 40.0000 mg | ORAL_TABLET | Freq: Every day | ORAL | 0 refills | Status: AC

## 2021-11-27 MED ORDER — BENZONATATE 100 MG PO CAPS: 200.0000 mg | ORAL_CAPSULE | Freq: Three times a day (TID) | ORAL | 0 refills | Status: DC | PRN

## 2021-11-27 NOTE — Progress Notes (Signed)
Virtual Visit via telephone Note Due to COVID-19 pandemic this visit was conducted virtually. This visit type was conducted due to national recommendations for restrictions regarding the COVID-19 Pandemic (e.g. social distancing, sheltering in place) in an effort to limit this patient's exposure and mitigate transmission in our community. All issues noted in this document were discussed and addressed.  A physical exam was not performed with this format.   I connected with Matthew Irwin on 11/27/2021 at 1210 by telephone and verified that I am speaking with the correct person using two identifiers. Matthew Irwin is currently located at home and family is currently with them during visit. The provider, Monia Pouch, FNP is located in their office at time of visit.  I discussed the limitations, risks, security and privacy concerns of performing an evaluation and management service by virtual visit and the availability of in person appointments. I also discussed with the patient that there may be a patient responsible charge related to this service. The patient expressed understanding and agreed to proceed.  Subjective:  Patient ID: Matthew Irwin, male    DOB: 17-Jul-1956, 66 y.o.   MRN: [id number]  Chief Complaint:  URI   HPI: Matthew Irwin is a 66 y.o. male presenting on 11/27/2021 for URI   URI  This is a new problem. Episode onset: 6 days ago. The problem has been waxing and waning. There has been no fever. Associated symptoms include congestion and coughing. Pertinent negatives include no abdominal pain, chest pain, diarrhea, dysuria, ear pain, headaches, joint pain, joint swelling, nausea, neck pain, plugged ear sensation, rash, rhinorrhea, sinus pain, sneezing, sore throat, swollen glands, vomiting or wheezing. He has tried acetaminophen and decongestant for the symptoms. The treatment provided mild relief.    Relevant past medical, surgical, family, and social history reviewed and updated as  indicated.  Allergies and medications reviewed and updated.   Past Medical History:  Diagnosis Date   ACEI/ARB contraindicated    Anxiety    Bronchitis    Cardiomyopathy    Chest pain    left heart catherization   CHF (congestive heart failure) (HCC)    History of cardiac catheterization    HTN (hypertension)    Hyperlipidemia    myalgias with pravastatin and simvastatin.   NHL (non-Hodgkin's lymphoma) (HCC)    history of   Obesity 08/16/2011   Sinus infection    Subacute effusive constrictive pericarditis     Past Surgical History:  Procedure Laterality Date   BACK SURGERY  07/2008   tumor removed off spine   heart sack removed  07/2009   HEMORRHOID SURGERY     LEFT HEART CATH  10/10   NASAL SINUS SURGERY     PORT-A-CATH REMOVAL Left 12/13/2016   Procedure: MINOR REMOVAL PORT-A-CATH;  Surgeon: Aviva Signs, MD;  Location: [facility];  Service: General;  Laterality: Left;   PORTACATH PLACEMENT     TUMOR REMOVAL      Social History   Socioeconomic History   Marital status: Married    Spouse name: Not on file   Number of children: Not on file   Years of education: Not on file   Highest education level: Not on file  Occupational History   Not on file  Tobacco Use   Smoking status: Former   Smokeless tobacco: Never   Tobacco comments:    Quit tobacco in 1999 after 35 pyr hx  Substance and Sexual Activity   Alcohol use: No  Drug use: No   Sexual activity: Yes  Other Topics Concern   Not on file  Social History Narrative   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  Chemotherapy, including Adriamycin, completed 3/10.  Radiation to chest/back in early 2010.  PET scan 11/10 with no evidence for recurrent lymphoma.       2. Chest pain: Left heart cath (10/10) with no angiographic coronary disease.      3.Cardiomyopathy: Nonischemic.  EF 35-40% with global hypokinesis by LV-gram 10/10.  Cardiac MRI (10/10) was limited by respiratory artifact  but showed a small to moderate free-flowing pericardial effusion with no tamponade, EF 43% with global hypokinesis, no myocardial delayed enhancement (no definite evidence of myocardial infarction, infiltrative disease, or myocarditis). Echo (11/10) with EF 40-45%.  Cardiomyopathy may be secondary to Adriamycin toxicity.  Echo after pericardiectomy in 10/18/23) showed EF 55%.      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  Had free-flowing effusion in 10/10 with no tamponade.  Patient was reimaged by echo in 11/10 showed EF 40-45% (global HK), dilated IVC, and evidence for ventricular interdependence with an organized pericardial effusion.  Hemodynamic right and left heart cath showed mean RA 14, RV 27/18, PA 29/17, mean PCWP 14, LVEDP 18, cardiac index 1.8 => there was equalization of diastolic pressure.  Also, ventricular interdependence was demonstrated by cath.  Patient underwent pericardiectomy in 11/10.  Echo after pericardiectomy 10-18-2023) showed EF 55%, moderate diastolic dysfunction, RV moderately dilated and moderately dysfunctional.  There were still some signs of constrictive physiology (respirophasic interventricular septal variation) but the IVC was small with inspiratory collapse.   Repeat echo 6/11 showed EF 50%, normal RV size and systolic function (improved from 2023-10-18), some septal shift with inspiration but less pronounced than in 10-18-23, and normal IVC size.       FH-No premature CAD   Positive for HTN, DM.    Sister with Turner's Syndrome         Social Determinants of Health   Financial Resource Strain: Not on file  Food Insecurity: Not on file  Transportation Needs: Not on file  Physical Activity: Not on file  Stress: Not on file  Social Connections: Not on file  Intimate Partner Violence: Not on file    Outpatient Encounter Medications as of 11/27/2021  Medication Sig   benzonatate (TESSALON PERLES) 100 MG capsule  Take 2 capsules (200 mg total) by mouth 3 (three) times daily as needed for cough.   guaiFENesin (MUCINEX) 600 MG 12 hr tablet Take 1 tablet (600 mg total) by mouth 2 (two) times daily for 10 days.   predniSONE (DELTASONE) 20 MG tablet Take 2 tablets (40 mg total) by mouth daily with breakfast for 5 days.   aspirin 81 MG tablet Take 81 mg by mouth at bedtime.   atorvastatin (LIPITOR) 40 MG tablet Take 1 tablet (40 mg total) by mouth daily at 6 PM.   carvedilol (COREG) 3.125 MG tablet Take 1 tablet (3.125 mg total) by mouth 2 (two) times daily with a meal.   cefdinir (OMNICEF) 300 MG capsule Take 1 capsule (300 mg total) by mouth 2 (two) times daily. 1 po BID   cetirizine (ZYRTEC) 10 MG tablet TAKE 1 TABLET DAILY   clonazePAM (KLONOPIN) 0.5 MG tablet Take 1 tablet (0.5 mg total) by mouth 2 (two) times daily.   DULoxetine (CYMBALTA) 60 MG capsule Take 1 capsule (60 mg total) by  mouth daily.   fluticasone (FLONASE) 50 MCG/ACT nasal spray Place 2 sprays into both nostrils daily.   losartan (COZAAR) 25 MG tablet Take 1 tablet (25 mg total) by mouth daily.   Multiple Vitamins-Minerals (CENTRUM SILVER ULTRA MENS) TABS Take 1 tablet by mouth daily.   Olopatadine HCl (PATADAY OP) Apply to eye.   Omega-3 Fatty Acids (FISH OIL) 1000 MG CAPS Take 1,000 mg by mouth 2 (two) times daily.   Propylene Glycol-Glycerin 0.6-0.6 % SOLN Apply 1 drop to eye daily as needed (dry eyes).   traZODone (DESYREL) 50 MG tablet Take 1/2 to1 tablet at bedtime as needed for sleep.   zolpidem (AMBIEN) 5 MG tablet Take 1 tablet (5 mg total) by mouth at bedtime as needed. for sleep   No facility-administered encounter medications on file as of 11/27/2021.    Allergies  Allergen Reactions   Penicillins Hives    Has patient had a PCN reaction causing immediate rash, facial/tongue/throat swelling, SOB or lightheadedness with hypotension: No Has patient had a PCN reaction causing severe rash involving mucus membranes or skin  necrosis: Yes Has patient had a PCN reaction that required hospitalization No Has patient had a PCN reaction occurring within the last 10 years: No If all of the above answers are "NO", then may proceed with Cephalosporin use.    Simvastatin     REACTION: myalgia    Review of Systems  Constitutional:  Negative for activity change, appetite change, chills, diaphoresis, fatigue, fever and unexpected weight change.  HENT:  Positive for congestion. Negative for ear pain, postnasal drip, rhinorrhea, sinus pain, sneezing and sore throat.   Respiratory:  Positive for cough. Negative for chest tightness, shortness of breath and wheezing.   Cardiovascular:  Negative for chest pain, palpitations and leg swelling.  Gastrointestinal:  Negative for abdominal pain, diarrhea, nausea and vomiting.  Genitourinary:  Negative for decreased urine volume, difficulty urinating and dysuria.  Musculoskeletal:  Negative for arthralgias, joint pain, myalgias and neck pain.  Skin:  Negative for rash.  Neurological:  Negative for weakness and headaches.  Psychiatric/Behavioral:  Negative for confusion.   All other systems reviewed and are negative.       Observations/Objective: No vital signs or physical exam, this was a virtual health encounter.  Pt alert and oriented, answers all questions appropriately, and able to speak in full sentences.    Assessment and Plan: Reco was seen today for uri.  Diagnoses and all orders for this visit:  URI with cough and congestion URI with cough and congestion. No indications of acute bacterial illness. Onset of symptoms 6 days ago so antivirals not indicated. Symptomatic care discussed in detail. Will add mucinex, tessalon, and prednisone to regimen. Report any new, worsening, or persistent symptoms.  -     benzonatate (TESSALON PERLES) 100 MG capsule; Take 2 capsules (200 mg total) by mouth 3 (three) times daily as needed for cough. -     guaiFENesin (MUCINEX) 600 MG  12 hr tablet; Take 1 tablet (600 mg total) by mouth 2 (two) times daily for 10 days. -     predniSONE (DELTASONE) 20 MG tablet; Take 2 tablets (40 mg total) by mouth daily with breakfast for 5 days.     Follow Up Instructions: Return if symptoms worsen or fail to improve.    I discussed the assessment and treatment plan with the patient. The patient was provided an opportunity to ask questions and all were answered. The patient agreed with the plan  and demonstrated an understanding of the instructions.   The patient was advised to call back or seek an in-person evaluation if the symptoms worsen or if the condition fails to improve as anticipated.  The above assessment and management plan was discussed with the patient. The patient verbalized understanding of and has agreed to the management plan. Patient is aware to call the clinic if they develop any new symptoms or if symptoms persist or worsen. Patient is aware when to return to the clinic for a follow-up visit. Patient educated on when it is appropriate to go to the emergency department.    I provided 15 minutes of time during this telephone encounter.   Monia Pouch, FNP-C [facility] [address] [place], [place] [address] [phone] 11/27/2021

## 2021-12-04 ENCOUNTER — Ambulatory Visit (INDEPENDENT_AMBULATORY_CARE_PROVIDER_SITE_OTHER): Payer: 59 | Admitting: Nurse Practitioner

## 2021-12-04 ENCOUNTER — Encounter: Payer: Self-pay | Admitting: Nurse Practitioner

## 2021-12-04 ENCOUNTER — Ambulatory Visit (INDEPENDENT_AMBULATORY_CARE_PROVIDER_SITE_OTHER): Payer: 59

## 2021-12-04 VITALS — BP 144/91 | HR 93 | Temp 97.4°F | Resp 20 | Ht 70.0 in | Wt 296.0 lb

## 2021-12-04 DIAGNOSIS — R051 Acute cough: Secondary | ICD-10-CM | POA: Diagnosis not present

## 2021-12-04 MED ORDER — AZITHROMYCIN 250 MG PO TABS: ORAL_TABLET | ORAL | 0 refills | Status: AC

## 2021-12-04 MED ORDER — PROMETHAZINE-DM 6.25-15 MG/5ML PO SYRP: 5.0000 mL | ORAL_SOLUTION | Freq: Four times a day (QID) | ORAL | 0 refills | Status: DC | PRN

## 2021-12-04 MED ORDER — ALBUTEROL SULFATE HFA 108 (90 BASE) MCG/ACT IN AERS: 2.0000 | INHALATION_SPRAY | Freq: Four times a day (QID) | RESPIRATORY_TRACT | 0 refills | Status: AC | PRN

## 2021-12-04 NOTE — Patient Instructions (Signed)

## 2021-12-04 NOTE — Progress Notes (Signed)
° °  Subjective:    Patient ID: Matthew Irwin, male    DOB: 1955-11-02, 66 y.o.   MRN: [id number]  Chief Complaint: Cough   Cough This is a new problem. The current episode started 1 to 4 weeks ago. The problem has been waxing and waning. The problem occurs constantly. The cough is Productive of sputum. Associated symptoms include nasal congestion and rhinorrhea. Pertinent negatives include no chills, ear congestion, ear pain or fever. Nothing aggravates the symptoms. He has tried OTC cough suppressant and oral steroids for the symptoms. The treatment provided mild relief. There is no history of bronchitis.  Negative for covid     Review of Systems  Constitutional:  Negative for chills and fever.  HENT:  Positive for rhinorrhea. Negative for ear pain.   Respiratory:  Positive for cough.       Objective:   Physical Exam Constitutional:      Appearance: Normal appearance. He is obese.  HENT:     Right Ear: Tympanic membrane normal.     Left Ear: Tympanic membrane normal.     Nose: Congestion and rhinorrhea present.  Eyes:     Extraocular Movements: Extraocular movements intact.     Pupils: Pupils are equal, round, and reactive to light.  Cardiovascular:     Rate and Rhythm: Normal rate and regular rhythm.     Heart sounds: Normal heart sounds.  Pulmonary:     Breath sounds: Wheezing present.  Musculoskeletal:     Cervical back: Normal range of motion.  Skin:    General: Skin is warm.  Neurological:     General: No focal deficit present.     Mental Status: He is alert and oriented to person, place, and time.  Psychiatric:        Mood and Affect: Mood normal.        Behavior: Behavior normal.    BP (!) 144/91    Pulse 93    Temp (!) 97.4 F (36.3 C) (Temporal)    Resp 20    Ht 5\' 10"  (1.778 m)    Wt 296 lb (134.3 kg)    SpO2 93%    BMI 42.47 kg/m    Chest xray- possible right early Parthenia Ames, FNP     Assessment & Plan:  Matthew Irwin in today with  chief complaint of Cough   1. Acute cough Force fluids Run humidifier - DG Chest 2 View - azithromycin (ZITHROMAX) 250 MG tablet; Take 2 tablets on day 1, then 1 tablet daily on days 2 through 5  Dispense: 6 tablet; Refill: 0 - albuterol (VENTOLIN HFA) 108 (90 Base) MCG/ACT inhaler; Inhale 2 puffs into the lungs every 6 (six) hours as needed for wheezing or shortness of breath.  Dispense: 8 g; Refill: 0 - promethazine-dextromethorphan (PROMETHAZINE-DM) 6.25-15 MG/5ML syrup; Take 5 mLs by mouth 4 (four) times daily as needed for cough.  Dispense: 118 mL; Refill: 0 RTO prn   The above assessment and management plan was discussed with the patient. The patient verbalized understanding of and has agreed to the management plan. Patient is aware to call the clinic if symptoms persist or worsen. Patient is aware when to return to the clinic for a follow-up visit. Patient educated on when it is appropriate to go to the emergency department.   Mary-Margaret Hassell Done, FNP

## 2021-12-06 ENCOUNTER — Other Ambulatory Visit: Payer: Self-pay | Admitting: Nurse Practitioner

## 2021-12-06 DIAGNOSIS — Z9109 Other allergy status, other than to drugs and biological substances: Secondary | ICD-10-CM

## 2021-12-11 ENCOUNTER — Encounter: Payer: Self-pay | Admitting: Nurse Practitioner

## 2021-12-11 ENCOUNTER — Ambulatory Visit (INDEPENDENT_AMBULATORY_CARE_PROVIDER_SITE_OTHER): Payer: 59 | Admitting: Nurse Practitioner

## 2021-12-11 VITALS — BP 104/65 | HR 81 | Temp 97.6°F | Ht 70.0 in | Wt 297.0 lb

## 2021-12-11 DIAGNOSIS — F5101 Primary insomnia: Secondary | ICD-10-CM | POA: Diagnosis not present

## 2021-12-11 DIAGNOSIS — E785 Hyperlipidemia, unspecified: Secondary | ICD-10-CM | POA: Diagnosis not present

## 2021-12-11 DIAGNOSIS — I509 Heart failure, unspecified: Secondary | ICD-10-CM | POA: Diagnosis not present

## 2021-12-11 DIAGNOSIS — C8339 Diffuse large B-cell lymphoma, extranodal and solid organ sites: Secondary | ICD-10-CM

## 2021-12-11 DIAGNOSIS — F411 Generalized anxiety disorder: Secondary | ICD-10-CM

## 2021-12-11 DIAGNOSIS — Z125 Encounter for screening for malignant neoplasm of prostate: Secondary | ICD-10-CM

## 2021-12-11 DIAGNOSIS — I10 Essential (primary) hypertension: Secondary | ICD-10-CM | POA: Diagnosis not present

## 2021-12-11 MED ORDER — CLONAZEPAM 0.5 MG PO TABS: 0.5000 mg | ORAL_TABLET | Freq: Two times a day (BID) | ORAL | 5 refills | Status: DC

## 2021-12-11 MED ORDER — DULOXETINE HCL 60 MG PO CPEP: 60.0000 mg | ORAL_CAPSULE | Freq: Every day | ORAL | 1 refills | Status: DC

## 2021-12-11 MED ORDER — LOSARTAN POTASSIUM 25 MG PO TABS: 25.0000 mg | ORAL_TABLET | Freq: Every day | ORAL | 1 refills | Status: DC

## 2021-12-11 MED ORDER — TRAZODONE HCL 50 MG PO TABS: 50.0000 mg | ORAL_TABLET | Freq: Every day | ORAL | 1 refills | Status: DC

## 2021-12-11 MED ORDER — ZOLPIDEM TARTRATE 5 MG PO TABS: 5.0000 mg | ORAL_TABLET | Freq: Every evening | ORAL | 5 refills | Status: DC | PRN

## 2021-12-11 MED ORDER — ATORVASTATIN CALCIUM 40 MG PO TABS: 40.0000 mg | ORAL_TABLET | Freq: Every day | ORAL | 1 refills | Status: DC

## 2021-12-11 MED ORDER — CARVEDILOL 3.125 MG PO TABS: 3.1250 mg | ORAL_TABLET | Freq: Two times a day (BID) | ORAL | 1 refills | Status: DC

## 2021-12-11 NOTE — Patient Instructions (Signed)

## 2021-12-11 NOTE — Progress Notes (Signed)
Subjective:    Patient ID: Matthew Irwin, male    DOB: 12-Nov-1955, 66 y.o.   MRN: [id number]   Chief Complaint: medical management of chronic issues     HPI:  Matthew Irwin is a 66 y.o. who identifies as a male who was assigned male at birth.   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 Primary hypertension No c/o chest pain, sob or headache. Does not check blood pressure at home. BP Readings from Last 3 Encounters:  12/04/21 (!) 144/91  06/12/21 101/73  12/13/20 95/70     2. Hyperlipidemia with target LDL less than 100 Does not watch diet and does no dedicated exercise Lab Results  Component Value Date   CHOL 140 06/12/2021   HDL 36 (L) 06/12/2021   LDLCALC 74 06/12/2021   TRIG 174 (H) 06/12/2021   CHOLHDL 3.9 06/12/2021   The 10-year ASCVD risk score (Arnett DK, et al., 2019) is: 15.7%   3. Chronic congestive heart failure, unspecified heart failure type ([place]) Has not seen cardiology in several years. Says he feels fine and didn't feel ik ehe needed to go.  4. Primary insomnia Is on trazadone and ambien to sleep at night. He cannot sleep at night without taking both.  5. GAD (generalized anxiety disorder) Is on klonopin BID. He usually only takes 1x a day  Last drug screen: today 12/11/21 [place] data base reviewed: 12/11/21 GAD 7 : Generalized Anxiety Score 12/11/2021 06/12/2021 12/13/2020 06/20/2020  Nervous, Anxious, on Edge 1 0 0 0  Control/stop worrying 0 0 0 0  Worry too much - different things 0 0 0 0  Trouble relaxing 0 0 0 0  Restless 0 0 0 0  Easily annoyed or irritable 0 0 0 0  Afraid - awful might happen 0 0 0 0  Total GAD 7 Score 1 0 0 0  Anxiety Difficulty Not difficult at all Not difficult at all Not difficult at all Not difficult at all       6. 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 Last chest xray was good.  7. Severe obesity (BMI >= 40) (HCC) No recent weight changes Wt Readings from Last 3 Encounters:  12/11/21 297 lb (134.7 kg)  12/04/21 296 lb (134.3 kg)  06/12/21 (!) 302 lb (137 kg)   BMI Readings from Last 3 Encounters:  12/11/21 42.62 kg/m  12/04/21 42.47 kg/m  06/12/21 43.33 kg/m      New complaints: None today  Allergies  Allergen Reactions   Penicillins Hives    Has patient had a PCN reaction causing immediate rash, facial/tongue/throat swelling, SOB or lightheadedness with hypotension: No Has patient had a PCN reaction causing severe rash involving mucus membranes or skin necrosis: Yes Has patient had a PCN reaction that required hospitalization No Has patient had a PCN reaction occurring within the last 10 years: No If all of the above answers are "NO", then may proceed with Cephalosporin use.    Simvastatin     REACTION: myalgia   Outpatient Encounter Medications as of 12/11/2021  Medication Sig   albuterol (VENTOLIN HFA) 108 (90 Base) MCG/ACT inhaler Inhale 2 puffs into the lungs every 6 (six) hours as needed for wheezing or shortness of breath.   aspirin 81 MG tablet Take 81 mg by mouth  at bedtime.   atorvastatin (LIPITOR) 40 MG tablet Take 1 tablet (40 mg total) by mouth daily at 6 PM.   carvedilol (COREG) 3.125 MG tablet Take 1 tablet (3.125 mg total) by mouth 2 (two) times daily with a meal.   cetirizine (ZYRTEC) 10 MG tablet TAKE 1 TABLET DAILY   clonazePAM (KLONOPIN) 0.5 MG tablet Take 1 tablet (0.5 mg total) by mouth 2 (two) times daily.   DULoxetine (CYMBALTA) 60 MG capsule Take 1 capsule (60 mg total) by mouth daily.   fluticasone (FLONASE) 50 MCG/ACT nasal spray Place 2 sprays into both nostrils daily.   losartan (COZAAR) 25 MG tablet Take 1 tablet (25 mg total) by mouth daily.   Multiple Vitamins-Minerals (CENTRUM SILVER ULTRA MENS) TABS Take 1 tablet by mouth daily.   Olopatadine HCl (PATADAY OP) Apply  to eye.   Omega-3 Fatty Acids (FISH OIL) 1000 MG CAPS Take 1,000 mg by mouth 2 (two) times daily.   promethazine-dextromethorphan (PROMETHAZINE-DM) 6.25-15 MG/5ML syrup Take 5 mLs by mouth 4 (four) times daily as needed for cough.   Propylene Glycol-Glycerin 0.6-0.6 % SOLN Apply 1 drop to eye daily as needed (dry eyes).   traZODone (DESYREL) 50 MG tablet Take 1/2 to1 tablet at bedtime as needed for sleep.   zolpidem (AMBIEN) 5 MG tablet Take 1 tablet (5 mg total) by mouth at bedtime as needed. for sleep   No facility-administered encounter medications on file as of 12/11/2021.    Past Surgical History:  Procedure Laterality Date   BACK SURGERY  07/2008   tumor removed off spine   heart sack removed  07/2009   HEMORRHOID SURGERY     LEFT HEART CATH  10/10   NASAL SINUS SURGERY     PORT-A-CATH REMOVAL Left 12/13/2016   Procedure: MINOR REMOVAL PORT-A-CATH;  Surgeon: Aviva Signs, MD;  Location: [facility];  Service: General;  Laterality: Left;   PORTACATH PLACEMENT     TUMOR REMOVAL      Family History  Problem Relation Age of Onset   Parkinson's disease Mother    Turner syndrome Sister        questionable   Colon cancer Neg Hx       Controlled substance contract: 12/11/21     Review of Systems  Constitutional:  Negative for diaphoresis.  Eyes:  Negative for pain.  Respiratory:  Negative for shortness of breath.   Cardiovascular:  Negative for chest pain, palpitations and leg swelling.  Gastrointestinal:  Negative for abdominal pain.  Endocrine: Negative for polydipsia.  Skin:  Negative for rash.  Neurological:  Negative for dizziness, weakness and headaches.  Hematological:  Does not bruise/bleed easily.  All other systems reviewed and are negative.     Objective:   Physical Exam Vitals and nursing note reviewed.  Constitutional:      Appearance: Normal appearance. He is well-developed.  HENT:     Head: Normocephalic.     Nose: Nose normal.     Mouth/Throat:      Mouth: Mucous membranes are moist.     Pharynx: Oropharynx is clear.  Eyes:     Pupils: Pupils are equal, round, and reactive to light.  Neck:     Thyroid: No thyroid mass or thyromegaly.     Vascular: No carotid bruit or JVD.     Trachea: Phonation normal.  Cardiovascular:     Rate and Rhythm: Normal rate and regular rhythm.  Pulmonary:     Effort: Pulmonary effort is normal. No  respiratory distress.     Breath sounds: Normal breath sounds.  Abdominal:     General: Bowel sounds are normal.     Palpations: Abdomen is soft.     Tenderness: There is no abdominal tenderness.  Musculoskeletal:        General: Normal range of motion.     Cervical back: Normal range of motion and neck supple.  Lymphadenopathy:     Cervical: No cervical adenopathy.  Skin:    General: Skin is warm and dry.  Neurological:     Mental Status: He is alert and oriented to person, place, and time.  Psychiatric:        Behavior: Behavior normal.        Thought Content: Thought content normal.        Judgment: Judgment normal.    BP 104/65    Pulse 81    Temp 97.6 F (36.4 C) (Temporal)    Ht _0  (1.778 m)    Wt 297 lb (134.7 kg)    BMI 42.62 kg/m        Assessment & Plan:   CARSTEN CARSTARPHEN comes in today with chief complaint of Medical Management of Chronic Issues and Hypertension   Diagnosis and orders addressed:  1. Primary hypertension Low sodium diet - CBC with Differential/Platelet - CMP14+EGFR - carvedilol (COREG) 3.125 MG tablet; Take 1 tablet (3.125 mg total) by mouth 2 (two) times daily with a meal.  Dispense: 180 tablet; Refill: 1 - losartan (COZAAR) 25 MG tablet; Take 1 tablet (25 mg total) by mouth daily.  Dispense: 90 tablet; Refill: 1  2. Hyperlipidemia with target LDL less than 100 Low fat diet - Lipid panel - atorvastatin (LIPITOR) 40 MG tablet; Take 1 tablet (40 mg total) by mouth daily at 6 PM.  Dispense: 90 tablet; Refill: 1  3. Chronic congestive heart failure,  unspecified heart failure type ([place]) If develops SOB need to see cardiology  4. Primary insomnia Bedtime routine - traZODone (DESYREL) 50 MG tablet; Take 1 tablet (50 mg total) by mouth at bedtime.  Dispense: 90 tablet; Refill: 1 - zolpidem (AMBIEN) 5 MG tablet; Take 1 tablet (5 mg total) by mouth at bedtime as needed. for sleep  Dispense: 30 tablet; Refill: 5  5. GAD (generalized anxiety disorder) Stress ,management - DULoxetine (CYMBALTA) 60 MG capsule; Take 1 capsule (60 mg total) by mouth daily.  Dispense: 90 capsule; Refill: 1 - clonazePAM (KLONOPIN) 0.5 MG tablet; Take 1 tablet (0.5 mg total) by mouth 2 (two) times daily.  Dispense: 60 tablet; Refill: 5  6. 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 Severe obesity (BMI >= 40) (HCC) Discussed diet and exercise for person with BMI >25 Will recheck weight in 3-6 months   8. Prostate cancer screening Labs pending - PSA, total and free   Labs pending Health Maintenance reviewed Diet and exercise encouraged  Follow up plan: 6 months   Mary-Margaret Hassell Done, FNP

## 2021-12-12 LAB — CBC WITH DIFFERENTIAL/PLATELET
Basophils Absolute: 0 10*3/uL (ref 0.0–0.2)
Basos: 0 %
EOS (ABSOLUTE): 0.3 10*3/uL (ref 0.0–0.4)
Eos: 5 %
Hematocrit: 43.3 % (ref 37.5–51.0)
Hemoglobin: 14.4 g/dL (ref 13.0–17.7)
Immature Grans (Abs): 0 10*3/uL (ref 0.0–0.1)
Immature Granulocytes: 0 %
Lymphocytes Absolute: 1.3 10*3/uL (ref 0.7–3.1)
Lymphs: 18 %
MCH: 31 pg (ref 26.6–33.0)
MCHC: 33.3 g/dL (ref 31.5–35.7)
MCV: 93 fL (ref 79–97)
Monocytes Absolute: 0.7 10*3/uL (ref 0.1–0.9)
Monocytes: 9 %
Neutrophils Absolute: 5 10*3/uL (ref 1.4–7.0)
Neutrophils: 68 %
Platelets: 223 10*3/uL (ref 150–450)
RBC: 4.65 x10E6/uL (ref 4.14–5.80)
RDW: 11.9 % (ref 11.6–15.4)
WBC: 7.3 10*3/uL (ref 3.4–10.8)

## 2021-12-12 LAB — CMP14+EGFR
ALT: 49 IU/L — ABNORMAL HIGH (ref 0–44)
AST: 42 IU/L — ABNORMAL HIGH (ref 0–40)
Albumin/Globulin Ratio: 1.2 (ref 1.2–2.2)
Albumin: 3.9 g/dL (ref 3.8–4.8)
Alkaline Phosphatase: 103 IU/L (ref 44–121)
BUN/Creatinine Ratio: 16 (ref 10–24)
BUN: 14 mg/dL (ref 8–27)
Bilirubin Total: 0.6 mg/dL (ref 0.0–1.2)
CO2: 27 mmol/L (ref 20–29)
Calcium: 9.6 mg/dL (ref 8.6–10.2)
Chloride: 98 mmol/L (ref 96–106)
Creatinine, Ser: 0.85 mg/dL (ref 0.76–1.27)
Globulin, Total: 3.3 g/dL (ref 1.5–4.5)
Glucose: 130 mg/dL — ABNORMAL HIGH (ref 70–99)
Potassium: 4.4 mmol/L (ref 3.5–5.2)
Sodium: 137 mmol/L (ref 134–144)
Total Protein: 7.2 g/dL (ref 6.0–8.5)
eGFR: 97 mL/min/{1.73_m2} (ref >59)

## 2021-12-12 LAB — LIPID PANEL
Chol/HDL Ratio: 4 ratio (ref 0.0–5.0)
Cholesterol, Total: 145 mg/dL (ref 100–199)
HDL: 36 mg/dL — ABNORMAL LOW (ref >39)
LDL Chol Calc (NIH): 73 mg/dL (ref 0–99)
Triglycerides: 215 mg/dL — ABNORMAL HIGH (ref 0–149)
VLDL Cholesterol Cal: 36 mg/dL (ref 5–40)

## 2021-12-12 LAB — PSA, TOTAL AND FREE
PSA, Free Pct: 20 %
PSA, Free: 0.08 ng/mL
Prostate Specific Ag, Serum: 0.4 ng/mL (ref 0.0–4.0)

## 2021-12-13 ENCOUNTER — Ambulatory Visit: Payer: PRIVATE HEALTH INSURANCE | Admitting: Nurse Practitioner

## 2021-12-16 LAB — TOXASSURE SELECT 13 (MW), URINE

## 2022-03-13 DIAGNOSIS — H5203 Hypermetropia, bilateral: Secondary | ICD-10-CM | POA: Diagnosis not present

## 2022-05-27 DIAGNOSIS — D485 Neoplasm of uncertain behavior of skin: Secondary | ICD-10-CM | POA: Diagnosis not present

## 2022-05-27 DIAGNOSIS — L821 Other seborrheic keratosis: Secondary | ICD-10-CM | POA: Diagnosis not present

## 2022-05-27 DIAGNOSIS — L570 Actinic keratosis: Secondary | ICD-10-CM | POA: Diagnosis not present

## 2022-05-27 DIAGNOSIS — C44311 Basal cell carcinoma of skin of nose: Secondary | ICD-10-CM | POA: Diagnosis not present

## 2022-05-27 DIAGNOSIS — C44219 Basal cell carcinoma of skin of left ear and external auricular canal: Secondary | ICD-10-CM | POA: Diagnosis not present

## 2022-05-27 DIAGNOSIS — Z85828 Personal history of other malignant neoplasm of skin: Secondary | ICD-10-CM | POA: Diagnosis not present

## 2022-06-10 ENCOUNTER — Ambulatory Visit: Payer: 59 | Admitting: Nurse Practitioner

## 2022-06-11 ENCOUNTER — Encounter: Payer: Self-pay | Admitting: Nurse Practitioner

## 2022-06-11 ENCOUNTER — Ambulatory Visit (INDEPENDENT_AMBULATORY_CARE_PROVIDER_SITE_OTHER): Payer: Medicare Other | Admitting: Nurse Practitioner

## 2022-06-11 VITALS — BP 119/80 | HR 90 | Temp 97.5°F | Resp 20 | Ht 70.0 in | Wt 297.0 lb

## 2022-06-11 DIAGNOSIS — F5101 Primary insomnia: Secondary | ICD-10-CM

## 2022-06-11 DIAGNOSIS — I509 Heart failure, unspecified: Secondary | ICD-10-CM | POA: Diagnosis not present

## 2022-06-11 DIAGNOSIS — F411 Generalized anxiety disorder: Secondary | ICD-10-CM

## 2022-06-11 DIAGNOSIS — C8339 Diffuse large B-cell lymphoma, extranodal and solid organ sites: Secondary | ICD-10-CM

## 2022-06-11 DIAGNOSIS — R739 Hyperglycemia, unspecified: Secondary | ICD-10-CM

## 2022-06-11 DIAGNOSIS — I10 Essential (primary) hypertension: Secondary | ICD-10-CM | POA: Diagnosis not present

## 2022-06-11 DIAGNOSIS — Z1211 Encounter for screening for malignant neoplasm of colon: Secondary | ICD-10-CM

## 2022-06-11 DIAGNOSIS — E785 Hyperlipidemia, unspecified: Secondary | ICD-10-CM | POA: Diagnosis not present

## 2022-06-11 DIAGNOSIS — I311 Chronic constrictive pericarditis: Secondary | ICD-10-CM

## 2022-06-11 MED ORDER — CARVEDILOL 3.125 MG PO TABS: 3.1250 mg | ORAL_TABLET | Freq: Two times a day (BID) | ORAL | 1 refills | Status: DC

## 2022-06-11 MED ORDER — LOSARTAN POTASSIUM 25 MG PO TABS: 25.0000 mg | ORAL_TABLET | Freq: Every day | ORAL | 1 refills | Status: DC

## 2022-06-11 MED ORDER — ZOLPIDEM TARTRATE 5 MG PO TABS: 5.0000 mg | ORAL_TABLET | Freq: Every evening | ORAL | 5 refills | Status: DC | PRN

## 2022-06-11 MED ORDER — CLONAZEPAM 0.5 MG PO TABS: 0.5000 mg | ORAL_TABLET | Freq: Two times a day (BID) | ORAL | 5 refills | Status: DC

## 2022-06-11 MED ORDER — DULOXETINE HCL 60 MG PO CPEP: 60.0000 mg | ORAL_CAPSULE | Freq: Every day | ORAL | 1 refills | Status: DC

## 2022-06-11 MED ORDER — ATORVASTATIN CALCIUM 40 MG PO TABS: 40.0000 mg | ORAL_TABLET | Freq: Every day | ORAL | 1 refills | Status: DC

## 2022-06-11 MED ORDER — TRAZODONE HCL 50 MG PO TABS: 50.0000 mg | ORAL_TABLET | Freq: Every day | ORAL | 1 refills | Status: DC

## 2022-06-11 NOTE — Patient Instructions (Signed)
  Cologuard  Your provider has prescribed Cologuard, an easy-to-use, noninvasive test for colon cancer screening, based on the latest advances in stool DNA science.   Here's what will happen next:  1. You may receive a call or email from [employer] to confirm your mailing address and insurance information 2. Your kit will be shipped directly to you 3. You collect your stool sample in the privacy of your own home. Please follow the instructions that come with the kit 4. You return the kit via [employer] shipping or pick-up, in the same box it arrived in 5. You should receive a call with the results once they are available. If you do not receive a call, please contact our office at [phone]  Insurance Coverage  Cologuard is covered by Medicare and most major insurers Cologuard is covered by Medicare and Medicare Advantage with no co-pay or deductible for eligible patients ages 32-85. [employer], more than 94% of Cologuard patients have no out-of-pocket cost for screening. Based on the [place] should be covered by most private insurers with no co-pay or deductible for eligible patients (ages 70-75; at average risk for colon cancer; without symptoms). Currently, ~74% of Cologuard patients 45-49 have had no out-of-pocket cost for screening.  Many national and regional payers have begun paying for CRC screening at 98. [employer] continues to work with payers to expand coverage and access for patients ages 75-49.   Only your healthcare insurance provider can confirm how Cologuard will be covered for you. If you have questions about coverage, you can contact your insurance company directly or ask the specialists at [employer] to do that for you. A Customer Support Specialist can be reached at [phone].    Patient Support Screening for colon cancer is very important to your good health, so if you have any questions at all, please call [facility]  Customer Support Specialists at [phone]. They are available 24 hours a day, 6 days a week. An instructional video is available to view online at [email or web address]   *Information and graphics obtained from [email or web address]

## 2022-06-11 NOTE — Progress Notes (Signed)
Subjective:    Patient ID: Matthew Irwin, male    DOB: 03/05/1956, 66 y.o.   MRN: [id number]   Chief Complaint: medical management of chronic issues     HPI:  Matthew Irwin is a 66 y.o. who identifies as a male who was assigned male at birth.   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 Primary hypertension No c/o chest pain, sob or headache. Doe snot check blood pressure at home. BP Readings from Last 3 Encounters:  12/11/21 104/65  12/04/21 (!) 144/91  06/12/21 101/73     2. Hyperlipidemia with target LDL less than 100 Does try to watch diet and stays active. Lab Results  Component Value Date   CHOL 145 12/11/2021   HDL 36 (L) 12/11/2021   LDLCALC 73 12/11/2021   TRIG 215 (H) 12/11/2021   CHOLHDL 4.0 12/11/2021     3. Constrictive pericarditis 4. Chronic congestive heart failure, unspecified heart failure type [facility]) He has not seen cardiology in several years. Does not feel that he needs to go.  5. 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 He has not had pet scan sine 2011.  6. GAD (generalized anxiety disorder) Is on klonopin BID    06/11/2022    8:53 AM 12/11/2021    9:15 AM 06/12/2021   10:45 AM 12/13/2020   10:05 AM  GAD 7 : Generalized Anxiety Score  Nervous, Anxious, on Edge 0 1 0 0  Control/stop worrying 0 0 0 0  Worry too much - different things 0 0 0 0  Trouble relaxing 0 0 0 0  Restless 0 0 0 0  Easily annoyed or irritable 0 0 0 0  Afraid - awful might happen 0 0 0 0  Total GAD 7 Score 0 1 0 0  Anxiety Difficulty Not difficult at all Not difficult at all Not difficult at all Not difficult at all      7. Primary insomnia Is on ambien nightly  8. Severe obesity (BMI >= 40) (HCC) No recent weight changes Wt Readings from Last 3 Encounters:  06/11/22 297 lb  (134.7 kg)  12/11/21 297 lb (134.7 kg)  12/04/21 296 lb (134.3 kg)   BMI Readings from Last 3 Encounters:  06/11/22 42.62 kg/m  12/11/21 42.62 kg/m  12/04/21 42.47 kg/m     New complaints: None today  Allergies  Allergen Reactions   Penicillins Hives    Has patient had a PCN reaction causing immediate rash, facial/tongue/throat swelling, SOB or lightheadedness with hypotension: No Has patient had a PCN reaction causing severe rash involving mucus membranes or skin necrosis: Yes Has patient had a PCN reaction that required hospitalization No Has patient had a PCN reaction occurring within the last 10 years: No If all of the above answers are "NO", then may proceed with Cephalosporin use.    Simvastatin     REACTION: myalgia   Outpatient Encounter Medications as of 06/11/2022  Medication Sig   albuterol (VENTOLIN HFA) 108 (90 Base) MCG/ACT inhaler Inhale 2 puffs into the lungs every 6 (six) hours as needed for wheezing or shortness of breath.   aspirin 81 MG tablet Take 81 mg by mouth at bedtime.   atorvastatin (LIPITOR) 40 MG tablet Take 1 tablet (40 mg total) by mouth daily at 6 PM.  carvedilol (COREG) 3.125 MG tablet Take 1 tablet (3.125 mg total) by mouth 2 (two) times daily with a meal.   cetirizine (ZYRTEC) 10 MG tablet TAKE 1 TABLET DAILY   clonazePAM (KLONOPIN) 0.5 MG tablet Take 1 tablet (0.5 mg total) by mouth 2 (two) times daily.   DULoxetine (CYMBALTA) 60 MG capsule Take 1 capsule (60 mg total) by mouth daily.   fluticasone (FLONASE) 50 MCG/ACT nasal spray Place 2 sprays into both nostrils daily.   losartan (COZAAR) 25 MG tablet Take 1 tablet (25 mg total) by mouth daily.   Multiple Vitamins-Minerals (CENTRUM SILVER ULTRA MENS) TABS Take 1 tablet by mouth daily.   Olopatadine HCl (PATADAY OP) Apply to eye.   Omega-3 Fatty Acids (FISH OIL) 1000 MG CAPS Take 1,000 mg by mouth 2 (two) times daily.   Propylene Glycol-Glycerin 0.6-0.6 % SOLN Apply 1 drop to eye daily  as needed (dry eyes).   traZODone (DESYREL) 50 MG tablet Take 1 tablet (50 mg total) by mouth at bedtime.   zolpidem (AMBIEN) 5 MG tablet Take 1 tablet (5 mg total) by mouth at bedtime as needed. for sleep   No facility-administered encounter medications on file as of 06/11/2022.    Past Surgical History:  Procedure Laterality Date   BACK SURGERY  07/2008   tumor removed off spine   heart sack removed  07/2009   HEMORRHOID SURGERY     LEFT HEART CATH  10/10   NASAL SINUS SURGERY     PORT-A-CATH REMOVAL Left 12/13/2016   Procedure: MINOR REMOVAL PORT-A-CATH;  Surgeon: Aviva Signs, MD;  Location: [facility];  Service: General;  Laterality: Left;   PORTACATH PLACEMENT     TUMOR REMOVAL      Family History  Problem Relation Age of Onset   Parkinson's disease Mother    Turner syndrome Sister        questionable   Colon cancer Neg Hx       Controlled substance contract: 12/13/21     Review of Systems  Constitutional:  Negative for diaphoresis.  Eyes:  Negative for pain.  Respiratory:  Negative for shortness of breath.   Cardiovascular:  Negative for chest pain, palpitations and leg swelling.  Gastrointestinal:  Negative for abdominal pain.  Endocrine: Negative for polydipsia.  Skin:  Negative for rash.  Neurological:  Negative for dizziness, weakness and headaches.  Hematological:  Does not bruise/bleed easily.  All other systems reviewed and are negative.      Objective:   Physical Exam Vitals and nursing note reviewed.  Constitutional:      Appearance: Normal appearance. He is well-developed.  HENT:     Head: Normocephalic.     Nose: Nose normal.     Mouth/Throat:     Mouth: Mucous membranes are moist.     Pharynx: Oropharynx is clear.  Eyes:     Pupils: Pupils are equal, round, and reactive to light.  Neck:     Thyroid: No thyroid mass or thyromegaly.     Vascular: No carotid bruit or JVD.     Trachea: Phonation normal.  Cardiovascular:     Rate and Rhythm:  Normal rate and regular rhythm.  Pulmonary:     Effort: Pulmonary effort is normal. No respiratory distress.     Breath sounds: Normal breath sounds.  Abdominal:     General: Bowel sounds are normal.     Palpations: Abdomen is soft.     Tenderness: There is no abdominal tenderness.  Musculoskeletal:  General: Normal range of motion.     Cervical back: Normal range of motion and neck supple.  Lymphadenopathy:     Cervical: No cervical adenopathy.  Skin:    General: Skin is warm and dry.  Neurological:     Mental Status: He is alert and oriented to person, place, and time.  Psychiatric:        Behavior: Behavior normal.        Thought Content: Thought content normal.        Judgment: Judgment normal.     BP 119/80   Pulse 90   Temp (!) 97.5 F (36.4 C) (Temporal)   Resp 20   Ht '5\' 10"'$  (1.778 m)   Wt 297 lb (134.7 kg)   SpO2 93%   BMI 42.62 kg/m        Assessment & Plan:  Matthew Irwin comes in today with chief complaint of Medical Management of Chronic Issues   Diagnosis and orders addressed:  1. Primary hypertension Low sodium diet - carvedilol (COREG) 3.125 MG tablet; Take 1 tablet (3.125 mg total) by mouth 2 (two) times daily with a meal.  Dispense: 180 tablet; Refill: 1 - losartan (COZAAR) 25 MG tablet; Take 1 tablet (25 mg total) by mouth daily.  Dispense: 90 tablet; Refill: 1  2. Hyperlipidemia with target LDL less than 100 Low fta diet - atorvastatin (LIPITOR) 40 MG tablet; Take 1 tablet (40 mg total) by mouth daily at 6 PM.  Dispense: 90 tablet; Refill: 1  3. Constrictive pericarditis 4. Chronic congestive heart failure, unspecified heart failure type ([place]) Need to see cardiologist yearly  5. 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 GAD (generalized anxiety disorder) Stress management - DULoxetine (CYMBALTA) 60 MG capsule; Take 1 capsule (60 mg total) by mouth daily.   Dispense: 90 capsule; Refill: 1 - clonazePAM (KLONOPIN) 0.5 MG tablet; Take 1 tablet (0.5 mg total) by mouth 2 (two) times daily.  Dispense: 60 tablet; Refill: 5  7. Primary insomnia Bedtime routine - traZODone (DESYREL) 50 MG tablet; Take 1 tablet (50 mg total) by mouth at bedtime.  Dispense: 90 tablet; Refill: 1 - zolpidem (AMBIEN) 5 MG tablet; Take 1 tablet (5 mg total) by mouth at bedtime as needed. for sleep  Dispense: 30 tablet; Refill: 5  8. Severe obesity (BMI >= 40) (HCC) Discussed diet and exercise for person with BMI >25 Will recheck weight in 3-6 months    Labs pending Health Maintenance reviewed Diet and exercise encouraged  Follow up plan: 6 months   Crystal, FNP

## 2022-06-11 NOTE — Addendum Note (Signed)
Addended by: [name] on: 06/11/2022 09:09 AM   Modules accepted: Orders

## 2022-06-12 LAB — CMP14+EGFR
ALT: 51 IU/L — ABNORMAL HIGH (ref 0–44)
AST: 44 IU/L — ABNORMAL HIGH (ref 0–40)
Albumin/Globulin Ratio: 1.4 (ref 1.2–2.2)
Albumin: 4.3 g/dL (ref 3.9–4.9)
Alkaline Phosphatase: 87 IU/L (ref 44–121)
BUN/Creatinine Ratio: 19 (ref 10–24)
BUN: 18 mg/dL (ref 8–27)
Bilirubin Total: 0.8 mg/dL (ref 0.0–1.2)
CO2: 26 mmol/L (ref 20–29)
Calcium: 9.5 mg/dL (ref 8.6–10.2)
Chloride: 103 mmol/L (ref 96–106)
Creatinine, Ser: 0.97 mg/dL (ref 0.76–1.27)
Globulin, Total: 3 g/dL (ref 1.5–4.5)
Glucose: 129 mg/dL — ABNORMAL HIGH (ref 70–99)
Potassium: 4.4 mmol/L (ref 3.5–5.2)
Sodium: 145 mmol/L — ABNORMAL HIGH (ref 134–144)
Total Protein: 7.3 g/dL (ref 6.0–8.5)
eGFR: 87 mL/min/{1.73_m2} (ref >59)

## 2022-06-12 LAB — LIPID PANEL
Chol/HDL Ratio: 3.5 ratio (ref 0.0–5.0)
Cholesterol, Total: 136 mg/dL (ref 100–199)
HDL: 39 mg/dL — ABNORMAL LOW (ref >39)
LDL Chol Calc (NIH): 71 mg/dL (ref 0–99)
Triglycerides: 153 mg/dL — ABNORMAL HIGH (ref 0–149)
VLDL Cholesterol Cal: 26 mg/dL (ref 5–40)

## 2022-06-12 LAB — CBC WITH DIFFERENTIAL/PLATELET
Basophils Absolute: 0 10*3/uL (ref 0.0–0.2)
Basos: 1 %
EOS (ABSOLUTE): 0.3 10*3/uL (ref 0.0–0.4)
Eos: 6 %
Hematocrit: 41.6 % (ref 37.5–51.0)
Hemoglobin: 14.3 g/dL (ref 13.0–17.7)
Immature Grans (Abs): 0 10*3/uL (ref 0.0–0.1)
Immature Granulocytes: 0 %
Lymphocytes Absolute: 1.1 10*3/uL (ref 0.7–3.1)
Lymphs: 21 %
MCH: 32.2 pg (ref 26.6–33.0)
MCHC: 34.4 g/dL (ref 31.5–35.7)
MCV: 94 fL (ref 79–97)
Monocytes Absolute: 0.5 10*3/uL (ref 0.1–0.9)
Monocytes: 10 %
Neutrophils Absolute: 3.3 10*3/uL (ref 1.4–7.0)
Neutrophils: 62 %
Platelets: 176 10*3/uL (ref 150–450)
RBC: 4.44 x10E6/uL (ref 4.14–5.80)
RDW: 11.9 % (ref 11.6–15.4)
WBC: 5.3 10*3/uL (ref 3.4–10.8)

## 2022-06-13 NOTE — Addendum Note (Signed)
Addended by: [name] on: 06/13/2022 11:38 AM   Modules accepted: Orders

## 2022-06-17 ENCOUNTER — Other Ambulatory Visit: Payer: Self-pay | Admitting: Nurse Practitioner

## 2022-06-17 DIAGNOSIS — Z9109 Other allergy status, other than to drugs and biological substances: Secondary | ICD-10-CM

## 2022-07-03 DIAGNOSIS — Z85828 Personal history of other malignant neoplasm of skin: Secondary | ICD-10-CM | POA: Diagnosis not present

## 2022-07-03 DIAGNOSIS — C44311 Basal cell carcinoma of skin of nose: Secondary | ICD-10-CM | POA: Diagnosis not present

## 2022-07-03 DIAGNOSIS — C44219 Basal cell carcinoma of skin of left ear and external auricular canal: Secondary | ICD-10-CM | POA: Diagnosis not present

## 2022-07-10 DIAGNOSIS — Z1211 Encounter for screening for malignant neoplasm of colon: Secondary | ICD-10-CM | POA: Diagnosis not present

## 2022-07-16 LAB — COLOGUARD: COLOGUARD: NEGATIVE

## 2022-08-14 DIAGNOSIS — L570 Actinic keratosis: Secondary | ICD-10-CM | POA: Diagnosis not present

## 2022-10-23 ENCOUNTER — Other Ambulatory Visit: Payer: Self-pay | Admitting: Nurse Practitioner

## 2022-10-23 DIAGNOSIS — F5101 Primary insomnia: Secondary | ICD-10-CM

## 2022-11-01 DIAGNOSIS — H1031 Unspecified acute conjunctivitis, right eye: Secondary | ICD-10-CM | POA: Diagnosis not present

## 2022-12-10 ENCOUNTER — Ambulatory Visit (INDEPENDENT_AMBULATORY_CARE_PROVIDER_SITE_OTHER): Payer: Medicare Other

## 2022-12-10 ENCOUNTER — Ambulatory Visit (INDEPENDENT_AMBULATORY_CARE_PROVIDER_SITE_OTHER): Payer: Medicare Other | Admitting: Nurse Practitioner

## 2022-12-10 ENCOUNTER — Encounter: Payer: Self-pay | Admitting: Nurse Practitioner

## 2022-12-10 VITALS — BP 111/76 | HR 72 | Temp 97.7°F | Resp 20 | Ht 70.0 in | Wt 292.0 lb

## 2022-12-10 DIAGNOSIS — Z0000 Encounter for general adult medical examination without abnormal findings: Secondary | ICD-10-CM

## 2022-12-10 DIAGNOSIS — C8339 Diffuse large B-cell lymphoma, extranodal and solid organ sites: Secondary | ICD-10-CM | POA: Diagnosis not present

## 2022-12-10 DIAGNOSIS — Z0001 Encounter for general adult medical examination with abnormal findings: Secondary | ICD-10-CM | POA: Diagnosis not present

## 2022-12-10 DIAGNOSIS — C83398 Diffuse large b-cell lymphoma of other extranodal and solid organ sites: Secondary | ICD-10-CM

## 2022-12-10 DIAGNOSIS — I10 Essential (primary) hypertension: Secondary | ICD-10-CM | POA: Diagnosis not present

## 2022-12-10 DIAGNOSIS — I311 Chronic constrictive pericarditis: Secondary | ICD-10-CM

## 2022-12-10 DIAGNOSIS — I509 Heart failure, unspecified: Secondary | ICD-10-CM

## 2022-12-10 DIAGNOSIS — I110 Hypertensive heart disease with heart failure: Secondary | ICD-10-CM | POA: Diagnosis not present

## 2022-12-10 DIAGNOSIS — R739 Hyperglycemia, unspecified: Secondary | ICD-10-CM | POA: Diagnosis not present

## 2022-12-10 DIAGNOSIS — F5101 Primary insomnia: Secondary | ICD-10-CM

## 2022-12-10 DIAGNOSIS — M545 Low back pain, unspecified: Secondary | ICD-10-CM | POA: Diagnosis not present

## 2022-12-10 DIAGNOSIS — F411 Generalized anxiety disorder: Secondary | ICD-10-CM | POA: Diagnosis not present

## 2022-12-10 DIAGNOSIS — E785 Hyperlipidemia, unspecified: Secondary | ICD-10-CM | POA: Diagnosis not present

## 2022-12-10 DIAGNOSIS — E119 Type 2 diabetes mellitus without complications: Secondary | ICD-10-CM

## 2022-12-10 DIAGNOSIS — Z6841 Body Mass Index (BMI) 40.0 and over, adult: Secondary | ICD-10-CM

## 2022-12-10 LAB — BAYER DCA HB A1C WAIVED: HB A1C (BAYER DCA - WAIVED): 6.4 % — ABNORMAL HIGH (ref 4.8–5.6)

## 2022-12-10 MED ORDER — TRAZODONE HCL 50 MG PO TABS: 50.0000 mg | ORAL_TABLET | Freq: Every day | ORAL | 1 refills | Status: DC

## 2022-12-10 MED ORDER — LOSARTAN POTASSIUM 25 MG PO TABS: 25.0000 mg | ORAL_TABLET | Freq: Every day | ORAL | 1 refills | Status: DC

## 2022-12-10 MED ORDER — CARVEDILOL 3.125 MG PO TABS: 3.1250 mg | ORAL_TABLET | Freq: Two times a day (BID) | ORAL | 1 refills | Status: DC

## 2022-12-10 MED ORDER — DULOXETINE HCL 60 MG PO CPEP: 60.0000 mg | ORAL_CAPSULE | Freq: Every day | ORAL | 1 refills | Status: DC

## 2022-12-10 MED ORDER — ZOLPIDEM TARTRATE 5 MG PO TABS: 5.0000 mg | ORAL_TABLET | Freq: Every evening | ORAL | 5 refills | Status: DC | PRN

## 2022-12-10 MED ORDER — CLONAZEPAM 0.5 MG PO TABS: 0.5000 mg | ORAL_TABLET | Freq: Two times a day (BID) | ORAL | 5 refills | Status: DC

## 2022-12-10 MED ORDER — ATORVASTATIN CALCIUM 40 MG PO TABS: 40.0000 mg | ORAL_TABLET | Freq: Every day | ORAL | 1 refills | Status: DC

## 2022-12-10 NOTE — Progress Notes (Signed)
Subjective:    Patient ID: Matthew Irwin, male    DOB: [birth year], 67 y.o.   MRN: [id number]   Chief Complaint: annual physical    HPI:  Matthew Irwin is a 67 y.o. who identifies as a male who was assigned male at birth.   Social history: Lives with: wife Work history: Water engineer   Comes in today for follow up of the following chronic medical issues:  1. Primary hypertension No c/o chest pain, sob or headache. Does not check blood pressure at h ome. BP Readings from Last 3 Encounters:  06/11/22 119/80  12/11/21 104/65  12/04/21 (!) 144/91     2. Chronic congestive heart failure, unspecified heart failure type ([place]) Has occasional lower ext edmea but other wise no symptoms.  3. CONSTRICTIVE PERICARDITIS He has not felt the need to see cardiology in several years.  4. Hyperlipidemia with target LDL less than 100 Does not watch diet and does no dedicated exercise. Lab Results  Component Value Date   CHOL 136 06/11/2022   HDL 39 (L) 06/11/2022   LDLCALC 71 06/11/2022   TRIG 153 (H) 06/11/2022   CHOLHDL 3.5 06/11/2022     5. GAD (generalized anxiety disorder) Takes klonooin BID - he has been on this since his cancer dx in 2012.    12/10/2022    8:05 AM 06/11/2022    8:53 AM 12/11/2021    9:15 AM 06/12/2021   10:45 AM  GAD 7 : Generalized Anxiety Score  Nervous, Anxious, on Edge 0 0 1 0  Control/stop worrying 0 0 0 0  Worry too much - different things 0 0 0 0  Trouble relaxing 0 0 0 0  Restless 0 0 0 0  Easily annoyed or irritable 0 0 0 0  Afraid - awful might happen 0 0 0 0  Total GAD 7 Score 0 0 1 0  Anxiety Difficulty Not difficult at all Not difficult at all Not difficult at all Not difficult at all      6. Primary insomnia Is on trazadone and ambien to slepp. Says he sleeps ok most nights. 6-7 hours.   7. 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 No reoccurrence . Is no longer being  followed by oncology  8. Severe obesity (BMI >= 40) (HCC) No recnet weight changes Wt Readings from Last 3 Encounters:  12/10/22 292 lb (132.5 kg)  06/11/22 297 lb (134.7 kg)  12/11/21 297 lb (134.7 kg)   BMI Readings from Last 3 Encounters:  12/10/22 41.90 kg/m  06/11/22 42.62 kg/m  12/11/21 42.62 kg/m    New complaints: Back pain. Started last week. He says it is  more of a ache. Rates 1/10 today. More of a new ence then pain. He started wearing his knee braces again and that seems to have helped. Blood sugar- last blood sugar was elevated- we added hgba1c but was never done- will do today  Allergies  Allergen Reactions   Penicillins Hives    Has patient had a PCN reaction causing immediate rash, facial/tongue/throat swelling, SOB or lightheadedness with hypotension: No Has patient had a PCN reaction causing severe rash involving mucus membranes or skin necrosis: Yes Has patient had a PCN reaction that required hospitalization No Has patient had a PCN reaction occurring within the last 10 years: No If all of the above answers are "NO", then may proceed with Cephalosporin use.  Simvastatin     REACTION: myalgia   Outpatient Encounter Medications as of 12/10/2022  Medication Sig   albuterol (VENTOLIN HFA) 108 (90 Base) MCG/ACT inhaler Inhale 2 puffs into the lungs every 6 (six) hours as needed for wheezing or shortness of breath. (Patient not taking: Reported on 06/11/2022)   aspirin 81 MG tablet Take 81 mg by mouth at bedtime.   atorvastatin (LIPITOR) 40 MG tablet Take 1 tablet (40 mg total) by mouth daily at 6 PM.   carvedilol (COREG) 3.125 MG tablet Take 1 tablet (3.125 mg total) by mouth 2 (two) times daily with a meal.   cetirizine (ZYRTEC) 10 MG tablet TAKE 1 TABLET DAILY   clonazePAM (KLONOPIN) 0.5 MG tablet Take 1 tablet (0.5 mg total) by mouth 2 (two) times daily.   DULoxetine (CYMBALTA) 60 MG capsule Take 1 capsule (60 mg total) by mouth daily.   fluticasone  (FLONASE) 50 MCG/ACT nasal spray Place 2 sprays into both nostrils daily.   losartan (COZAAR) 25 MG tablet Take 1 tablet (25 mg total) by mouth daily.   Multiple Vitamins-Minerals (CENTRUM SILVER ULTRA MENS) TABS Take 1 tablet by mouth daily.   Olopatadine HCl (PATADAY OP) Apply to eye.   Omega-3 Fatty Acids (FISH OIL) 1000 MG CAPS Take 1,000 mg by mouth 2 (two) times daily.   Propylene Glycol-Glycerin 0.6-0.6 % SOLN Apply 1 drop to eye daily as needed (dry eyes).   traZODone (DESYREL) 50 MG tablet TAKE ONE TABLET AT BEDTIME   zolpidem (AMBIEN) 5 MG tablet Take 1 tablet (5 mg total) by mouth at bedtime as needed. for sleep   No facility-administered encounter medications on file as of 12/10/2022.    Past Surgical History:  Procedure Laterality Date   BACK SURGERY  07/2008   tumor removed off spine   heart sack removed  07/2009   HEMORRHOID SURGERY     LEFT HEART CATH  10/10   NASAL SINUS SURGERY     PORT-A-CATH REMOVAL Left 12/13/2016   Procedure: MINOR REMOVAL PORT-A-CATH;  Surgeon: Aviva Signs, MD;  Location: [facility];  Service: General;  Laterality: Left;   PORTACATH PLACEMENT     TUMOR REMOVAL      Family History  Problem Relation Age of Onset   Parkinson's disease Mother    Turner syndrome Sister        questionable   Colon cancer Neg Hx       Controlled substance contract: 12/10/22 drug screen today     Review of Systems  Constitutional:  Negative for diaphoresis.  Eyes:  Negative for pain.  Respiratory:  Negative for shortness of breath.   Cardiovascular:  Negative for chest pain, palpitations and leg swelling.  Gastrointestinal:  Negative for abdominal pain.  Endocrine: Negative for polydipsia.  Skin:  Negative for rash.  Neurological:  Negative for dizziness, weakness and headaches.  Hematological:  Does not bruise/bleed easily.  All other systems reviewed and are negative.      Objective:   Physical Exam Vitals and nursing note reviewed.   Constitutional:      Appearance: Normal appearance. He is well-developed.  HENT:     Head: Normocephalic.     Nose: Nose normal.     Mouth/Throat:     Mouth: Mucous membranes are moist.     Pharynx: Oropharynx is clear.  Eyes:     Pupils: Pupils are equal, round, and reactive to light.  Neck:     Thyroid: No thyroid mass or thyromegaly.  Vascular: No carotid bruit or JVD.     Trachea: Phonation normal.  Cardiovascular:     Rate and Rhythm: Normal rate and regular rhythm.  Pulmonary:     Effort: Pulmonary effort is normal. No respiratory distress.     Breath sounds: Normal breath sounds.  Abdominal:     General: Bowel sounds are normal.     Palpations: Abdomen is soft.     Tenderness: There is no abdominal tenderness.  Musculoskeletal:        General: Normal range of motion.     Cervical back: Normal range of motion and neck supple.  Lymphadenopathy:     Cervical: No cervical adenopathy.  Skin:    General: Skin is warm and dry.  Neurological:     Mental Status: He is alert and oriented to person, place, and time.  Psychiatric:        Behavior: Behavior normal.        Thought Content: Thought content normal.        Judgment: Judgment normal.    BP 111/76   Pulse 72   Temp 97.7 F (36.5 C) (Temporal)   Resp 20   Ht 5' 10"$  (1.778 m)   Wt 292 lb (132.5 kg)   SpO2 94%   BMI 41.90 kg/m    HGBA1c 6.4%  Lumbar xray- pending radiology report    Assessment & Plan:  BERLEY TIBBETTS comes in today with chief complaint of Annual Exam   Diagnosis and orders addressed:  1. Annual physical exam   2. Primary hypertension Low sodium diet - CBC with Differential/Platelet - CMP14+EGFR - carvedilol (COREG) 3.125 MG tablet; Take 1 tablet (3.125 mg total) by mouth 2 (two) times daily with a meal.  Dispense: 180 tablet; Refill: 1 - losartan (COZAAR) 25 MG tablet; Take 1 tablet (25 mg total) by mouth daily.  Dispense: 90 tablet; Refill: 1  3. Chronic congestive heart  failure, unspecified heart failure type ([place]) Will see cardiolgy if needed  4. CONSTRICTIVE PERICARDITIS  5. Hyperlipidemia with target LDL less than 100 Low fat diet - Lipid panel - atorvastatin (LIPITOR) 40 MG tablet; Take 1 tablet (40 mg total) by mouth daily at 6 PM.  Dispense: 90 tablet; Refill: 1  6. GAD (generalized anxiety disorder) Stress manaegment - ToxASSURE Select 13 (MW), Urine - clonazePAM (KLONOPIN) 0.5 MG tablet; Take 1 tablet (0.5 mg total) by mouth 2 (two) times daily.  Dispense: 60 tablet; Refill: 5 - DULoxetine (CYMBALTA) 60 MG capsule; Take 1 capsule (60 mg total) by mouth daily.  Dispense: 90 capsule; Refill: 1  7. Primary insomnia Bedtime routine - traZODone (DESYREL) 50 MG tablet; Take 1 tablet (50 mg total) by mouth at bedtime.  Dispense: 90 tablet; Refill: 1 - zolpidem (AMBIEN) 5 MG tablet; Take 1 tablet (5 mg total) by mouth at bedtime as needed. for sleep  Dispense: 30 tablet; Refill: 5  8. 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 Severe obesity (BMI >= 40) (HCC) Discussed diet and exercise for person with BMI >25 Will recheck weight in 3-6 months   10. Elevated blood sugar - Bayer DCA Hb A1c Waived  11. Diabetes mellitus without complication ([place]) New dx Low carb diet- granddaughter is diabetic so he knows what not to eat!   Labs pending Health Maintenance reviewed Diet and exercise encouraged  Follow up plan: 3 months  Mary-Margaret Axavier Pressley, FNP   

## 2022-12-10 NOTE — Patient Instructions (Signed)

## 2022-12-11 LAB — CMP14+EGFR
ALT: 49 [IU]/L — ABNORMAL HIGH (ref 0–44)
AST: 46 [IU]/L — ABNORMAL HIGH (ref 0–40)
Albumin/Globulin Ratio: 1.4 (ref 1.2–2.2)
Albumin: 4.1 g/dL (ref 3.9–4.9)
Alkaline Phosphatase: 88 [IU]/L (ref 44–121)
BUN/Creatinine Ratio: 10 (ref 10–24)
BUN: 9 mg/dL (ref 8–27)
Bilirubin Total: 0.8 mg/dL (ref 0.0–1.2)
CO2: 26 mmol/L (ref 20–29)
Calcium: 9.5 mg/dL (ref 8.6–10.2)
Chloride: 98 mmol/L (ref 96–106)
Creatinine, Ser: 0.93 mg/dL (ref 0.76–1.27)
Globulin, Total: 3 g/dL (ref 1.5–4.5)
Glucose: 157 mg/dL — ABNORMAL HIGH (ref 70–99)
Potassium: 4.2 mmol/L (ref 3.5–5.2)
Sodium: 138 mmol/L (ref 134–144)
Total Protein: 7.1 g/dL (ref 6.0–8.5)
eGFR: 91 mL/min/{1.73_m2}

## 2022-12-11 LAB — LIPID PANEL
Chol/HDL Ratio: 3.3 ratio (ref 0.0–5.0)
Cholesterol, Total: 137 mg/dL (ref 100–199)
HDL: 41 mg/dL (ref >39)
LDL Chol Calc (NIH): 67 mg/dL (ref 0–99)
Triglycerides: 172 mg/dL — ABNORMAL HIGH (ref 0–149)
VLDL Cholesterol Cal: 29 mg/dL (ref 5–40)

## 2022-12-11 LAB — CBC WITH DIFFERENTIAL/PLATELET
Basophils Absolute: 0 10*3/uL (ref 0.0–0.2)
Basos: 0 %
EOS (ABSOLUTE): 0.3 10*3/uL (ref 0.0–0.4)
Eos: 6 %
Hematocrit: 42.7 % (ref 37.5–51.0)
Hemoglobin: 13.7 g/dL (ref 13.0–17.7)
Immature Grans (Abs): 0 10*3/uL (ref 0.0–0.1)
Immature Granulocytes: 0 %
Lymphocytes Absolute: 1.3 10*3/uL (ref 0.7–3.1)
Lymphs: 25 %
MCH: 30.4 pg (ref 26.6–33.0)
MCHC: 32.1 g/dL (ref 31.5–35.7)
MCV: 95 fL (ref 79–97)
Monocytes Absolute: 0.4 10*3/uL (ref 0.1–0.9)
Monocytes: 8 %
Neutrophils Absolute: 3.1 10*3/uL (ref 1.4–7.0)
Neutrophils: 61 %
Platelets: 180 10*3/uL (ref 150–450)
RBC: 4.5 x10E6/uL (ref 4.14–5.80)
RDW: 11.9 % (ref 11.6–15.4)
WBC: 5.2 10*3/uL (ref 3.4–10.8)

## 2022-12-12 LAB — TOXASSURE SELECT 13 (MW), URINE

## 2022-12-24 NOTE — Progress Notes (Deleted)
Subjective:   Matthew Irwin is a 67 y.o. male who presents for an Initial Medicare Annual Wellness Visit.  Review of Systems    ***       Objective:    There were no vitals filed for this visit. There is no height or weight on file to calculate BMI.     11/19/2017   12:07 PM 11/11/2016    1:25 PM 10/17/2016    4:15 PM 08/19/2016    4:30 PM 07/08/2016    3:54 PM 02/21/2016    4:20 PM 12/27/2015    4:20 PM  Advanced Directives  Does Patient Have a Medical Advance Directive? No No No No No No No  Would patient like information on creating a medical advance directive? No - Patient declined   No - patient declined information No - patient declined information No - patient declined information No - patient declined information    Current Medications (verified) Outpatient Encounter Medications as of 12/25/2022  Medication Sig   albuterol (VENTOLIN HFA) 108 (90 Base) MCG/ACT inhaler Inhale 2 puffs into the lungs every 6 (six) hours as needed for wheezing or shortness of breath. (Patient not taking: Reported on 12/10/2022)   aspirin 81 MG tablet Take 81 mg by mouth at bedtime.   atorvastatin (LIPITOR) 40 MG tablet Take 1 tablet (40 mg total) by mouth daily at 6 PM.   carvedilol (COREG) 3.125 MG tablet Take 1 tablet (3.125 mg total) by mouth 2 (two) times daily with a meal.   cetirizine (ZYRTEC) 10 MG tablet TAKE 1 TABLET DAILY   clonazePAM (KLONOPIN) 0.5 MG tablet Take 1 tablet (0.5 mg total) by mouth 2 (two) times daily.   DULoxetine (CYMBALTA) 60 MG capsule Take 1 capsule (60 mg total) by mouth daily.   fluticasone (FLONASE) 50 MCG/ACT nasal spray Place 2 sprays into both nostrils daily. (Patient not taking: Reported on 12/10/2022)   losartan (COZAAR) 25 MG tablet Take 1 tablet (25 mg total) by mouth daily.   Multiple Vitamins-Minerals (CENTRUM SILVER ULTRA MENS) TABS Take 1 tablet by mouth daily.   Olopatadine HCl (PATADAY OP) Apply to eye.   Omega-3 Fatty Acids (FISH OIL) 1000 MG CAPS Take  1,000 mg by mouth 2 (two) times daily.   Propylene Glycol-Glycerin 0.6-0.6 % SOLN Apply 1 drop to eye daily as needed (dry eyes).   traZODone (DESYREL) 50 MG tablet Take 1 tablet (50 mg total) by mouth at bedtime.   zolpidem (AMBIEN) 5 MG tablet Take 1 tablet (5 mg total) by mouth at bedtime as needed. for sleep   No facility-administered encounter medications on file as of 12/25/2022.    Allergies (verified) Penicillins and Simvastatin   History: Past Medical History:  Diagnosis Date   ACEI/ARB contraindicated    Anxiety    Bronchitis    Cardiomyopathy    Chest pain    left heart catherization   CHF (congestive heart failure) (HCC)    History of cardiac catheterization    HTN (hypertension)    Hyperlipidemia    myalgias with pravastatin and simvastatin.   NHL (non-Hodgkin's lymphoma) ([place])    history of   Obesity 08/16/2011   Sinus infection    Subacute effusive constrictive pericarditis    Past Surgical History:  Procedure Laterality Date   BACK SURGERY  07/2008   tumor removed off spine   heart sack removed  07/2009   HEMORRHOID SURGERY     LEFT HEART CATH  10/10   NASAL  SINUS SURGERY     PORT-A-CATH REMOVAL Left 12/13/2016   Procedure: MINOR REMOVAL PORT-A-CATH;  Surgeon: Aviva Signs, MD;  Location: [facility];  Service: General;  Laterality: Left;   PORTACATH PLACEMENT     TUMOR REMOVAL     Family History  Problem Relation Age of Onset   Parkinson's disease Mother    Turner syndrome Sister        questionable   Colon cancer Neg Hx    Social History   Socioeconomic History   Marital status: Married    Spouse name: Not on file   Number of children: Not on file   Years of education: Not on file   Highest education level: Not on file  Occupational History   Not on file  Tobacco Use   Smoking status: Former   Smokeless tobacco: Never   Tobacco comments:    Quit tobacco in 1999 after 35 pyr hx  Substance and Sexual Activity   Alcohol use: No   Drug use: No    Sexual activity: Yes  Other Topics Concern   Not on file  Social History Narrative   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  Chemotherapy, including Adriamycin, completed 3/10.  Radiation to chest/back in early 2010.  PET scan 11/10 with no evidence for recurrent lymphoma.       2. Chest pain: Left heart cath (10/10) with no angiographic coronary disease.      3.Cardiomyopathy: Nonischemic.  EF 35-40% with global hypokinesis by LV-gram 10/10.  Cardiac MRI (10/10) was limited by respiratory artifact but showed a small to moderate free-flowing pericardial effusion with no tamponade, EF 43% with global hypokinesis, no myocardial delayed enhancement (no definite evidence of myocardial infarction, infiltrative disease, or myocarditis). Echo (11/10) with EF 40-45%.  Cardiomyopathy may be secondary to Adriamycin toxicity.  Echo after pericardiectomy in October 23, 2023) showed EF 55%.      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  Had free-flowing effusion in 10/10 with no tamponade.  Patient was reimaged by echo in 11/10 showed EF 40-45% (global HK), dilated IVC, and evidence for ventricular interdependence with an organized pericardial effusion.  Hemodynamic right and left heart cath showed mean RA 14, RV 27/18, PA 29/17, mean PCWP 14, LVEDP 18, cardiac index 1.8 => there was equalization of diastolic pressure.  Also, ventricular interdependence was demonstrated by cath.  Patient underwent pericardiectomy in 11/10.  Echo after pericardiectomy 23-Oct-2023) showed EF 55%, moderate diastolic dysfunction, RV moderately dilated and moderately dysfunctional.  There were still some signs of constrictive physiology (respirophasic interventricular septal variation) but the IVC was small with inspiratory collapse.   Repeat echo 6/11 showed EF 50%, normal RV size and systolic function (improved from 10-23-23), some septal shift with inspiration  but less pronounced than in 2023-10-23, and normal IVC size.       FH-No premature CAD   Positive for HTN, DM.    Sister with Turner's Syndrome         Social Determinants of Health   Financial Resource Strain: Not on file  Food Insecurity: Not on file  Transportation Needs: Not on file  Physical Activity: Not on file  Stress: Not on file  Social Connections: Not on file    Tobacco Counseling Counseling given: Not Answered Tobacco comments: Quit tobacco in 1999 after 35 pyr hx   Clinical Intake:  Diabetic?No          Activities of Daily Living     No data to display          Patient Care Team: [name], FNP as PCP - General (Nurse Practitioner) Aviva Signs, MD as Consulting Physician (General Surgery)  Indicate any recent Medical Services you may have received from other than [facility] providers in the past year (date may be approximate).     Assessment:   This is a routine wellness examination for [employer].  Hearing/Vision screen No results found.  Dietary issues and exercise activities discussed:     Goals Addressed   None    Depression Screen    12/10/2022    8:04 AM 06/11/2022    8:52 AM 12/11/2021    9:15 AM 06/12/2021   10:44 AM 12/13/2020   10:05 AM 09/12/2020   10:09 AM 06/20/2020    8:44 AM  PHQ 2/9 Scores  PHQ - 2 Score 1 0 0 0 0 0 0  PHQ- 9 Score '2 1 3 '$ 0       Fall Risk    12/10/2022    8:04 AM 06/11/2022    8:52 AM 12/11/2021    9:13 AM 06/12/2021   10:44 AM 12/13/2020   10:04 AM  Fall Risk   Falls in the past year? 1 1 0 1 1  Number falls in past yr: 1 0  0 1  Injury with Fall? 0 0  0 0  Risk for fall due to : History of fall(s) History of fall(s)   History of fall(s)  Follow up Education provided Education provided   Education provided    FALL RISK PREVENTION PERTAINING TO THE HOME:  Any stairs in or around the home? {YES/NO:21197} If so, are there any without handrails? {YES/NO:21197} Home free  of loose throw rugs in walkways, pet beds, electrical cords, etc? {YES/NO:21197} Adequate lighting in your home to reduce risk of falls? {YES/NO:21197}  ASSISTIVE DEVICES UTILIZED TO PREVENT FALLS:  Life alert? {YES/NO:21197} Use of a cane, walker or w/c? {YES/NO:21197} Grab bars in the bathroom? {YES/NO:21197} Shower chair or bench in shower? {YES/NO:21197} Elevated toilet seat or a handicapped toilet? {YES/NO:21197}  TIMED UP AND GO:  Was the test performed? No . Telephonic visit   Cognitive Function:        Immunizations Immunization History  Administered Date(s) Administered   Covid-19, Mrna,Vaccine(Spikevax)109yr and older 09/10/2022   Fluad Quad(high Dose 65+) 08/14/2022   Influenza Whole 07/31/2010   Influenza,inj,Quad PF,6+ Mos 11/17/2013, 07/12/2014, 08/02/2015, 08/13/2016, 07/30/2017, 08/20/2018, 09/12/2020   Influenza-Unspecified 07/16/2021   Moderna Sars-Covid-2 Vaccination 01/04/2020, 02/01/2020, 09/19/2020, 04/10/2021   Pneumococcal Conjugate-13 11/14/2016   Tdap 05/22/2011, 06/12/2021    TDAP status: Up to date  {Flu Vaccine status:2101806}  {Pneumococcal vaccine status:2101807}  Covid-19 vaccine status: Information provided on how to obtain vaccines.   Qualifies for Shingles Vaccine? Yes   Zostavax completed No   Shingrix Completed?: No.    Education has been provided regarding the importance of this vaccine. Patient has been advised to call insurance company to determine out of pocket expense if they have not yet received this vaccine. Advised may also receive vaccine at local pharmacy or Health Dept. Verbalized acceptance and understanding.  Screening Tests Health Maintenance  Topic Date Due   Medicare Annual Wellness (AWV)  Never done   HIV Screening  Never done   Diabetic kidney evaluation - Urine ACR  Never done   COVID-19 Vaccine (6 -  2023-24 season) 12/26/2022 (Originally 11/05/2022)   Zoster Vaccines- Shingrix (1 of 2) 03/11/2023 (Originally  01/04/1975)   Pneumonia Vaccine 19+ Years old (2 of 2 - PPSV23 or PCV20) 06/12/2023 (Originally 01/03/2022)   Diabetic kidney evaluation - eGFR measurement  12/11/2023   Fecal DNA (Cologuard)  07/10/2025   DTaP/Tdap/Td (3 - Td or Tdap) 06/13/2031   INFLUENZA VACCINE  Completed   Hepatitis C Screening  Completed   HPV VACCINES  Aged Out    Health Maintenance  Health Maintenance Due  Topic Date Due   Medicare Annual Wellness (AWV)  Never done   HIV Screening  Never done   Diabetic kidney evaluation - Urine ACR  Never done    Colorectal cancer screening: Type of screening: Cologuard. Completed 07/10/22. Repeat every 3 years  Lung Cancer Screening: (Low Dose CT Chest recommended if Age 55-80 years, 30 pack-year currently smoking OR have quit w/in 15years.) does not qualify.   Lung Cancer Screening Referral: n/a  Additional Screening:  Hepatitis C Screening: does qualify; Completed 09/15/15  Vision Screening: Recommended annual ophthalmology exams for early detection of glaucoma and other disorders of the eye. Is the patient up to date with their annual eye exam?  {YES/NO:21197} Who is the provider or what is the name of the office in which the patient attends annual eye exams? *** If pt is not established with a provider, would they like to be referred to a provider to establish care? {YES/NO:21197}.   Dental Screening: Recommended annual dental exams for proper oral hygiene  Community Resource Referral / Chronic Care Management: CRR required this visit?  {YES/NO:21197}  CCM required this visit?  {YES/NO:21197}     Plan:     I have personally reviewed and noted the following in the patient's chart:   Medical and social history Use of alcohol, tobacco or illicit drugs  Current medications and supplements including opioid prescriptions. {Opioid Prescriptions:[phone]} Functional ability and status Nutritional status Physical activity Advanced directives List of other  physicians Hospitalizations, surgeries, and ER visits in previous 12 months Vitals Screenings to include cognitive, depression, and falls Referrals and appointments  In addition, I have reviewed and discussed with patient certain preventive protocols, quality metrics, and best practice recommendations. A written personalized care plan for preventive services as well as general preventive health recommendations were provided to patient.     Vanetta Mulders, [place]   [id number]   Due to this being a virtual visit, the after visit summary with patients personalized plan was offered to patient via mail or my-chart. ***Patient declined at this time./ Patient would like to access on my-chart/ per request, patient was mailed a copy of AVS./ Patient preferred to pick up at office at next visit  Nurse Notes: ***

## 2022-12-25 ENCOUNTER — Ambulatory Visit: Payer: Medicare Other

## 2022-12-30 ENCOUNTER — Other Ambulatory Visit: Payer: Self-pay | Admitting: Nurse Practitioner

## 2022-12-30 DIAGNOSIS — Z9109 Other allergy status, other than to drugs and biological substances: Secondary | ICD-10-CM

## 2023-01-09 ENCOUNTER — Ambulatory Visit (INDEPENDENT_AMBULATORY_CARE_PROVIDER_SITE_OTHER): Payer: Medicare Other

## 2023-01-09 VITALS — Ht 70.0 in | Wt 295.0 lb

## 2023-01-09 DIAGNOSIS — Z0000 Encounter for general adult medical examination without abnormal findings: Secondary | ICD-10-CM

## 2023-01-09 NOTE — Progress Notes (Signed)
Subjective:   Matthew Irwin is a 67 y.o. male who presents for Medicare Annual/Subsequent preventive examination. I connected with  [name] on 01/09/23 by a audio enabled telemedicine application and verified that I am speaking with the correct person using two identifiers.  Patient Location: Home  Provider Location: Home Office  I discussed the limitations of evaluation and management by telemedicine. The patient expressed understanding and agreed to proceed.  Review of Systems     Cardiac Risk Factors include: advanced age (>79men, >25 women);male gender;hypertension;dyslipidemia     Objective:    Today's Vitals   01/09/23 0849  Weight: 295 lb (133.8 kg)  Height: 5\' 10"  (1.778 m)   Body mass index is 42.33 kg/m.     01/09/2023    8:52 AM 11/19/2017   12:07 PM 11/11/2016    1:25 PM 10/17/2016    4:15 PM 08/19/2016    4:30 PM 07/08/2016    3:54 PM 02/21/2016    4:20 PM  Advanced Directives  Does Patient Have a Medical Advance Directive? No No No No No No No  Would patient like information on creating a medical advance directive? No - Patient declined No - Patient declined   No - patient declined information No - patient declined information No - patient declined information    Current Medications (verified) Outpatient Encounter Medications as of 01/09/2023  Medication Sig   albuterol (VENTOLIN HFA) 108 (90 Base) MCG/ACT inhaler Inhale 2 puffs into the lungs every 6 (six) hours as needed for wheezing or shortness of breath.   aspirin 81 MG tablet Take 81 mg by mouth at bedtime.   atorvastatin (LIPITOR) 40 MG tablet Take 1 tablet (40 mg total) by mouth daily at 6 PM.   carvedilol (COREG) 3.125 MG tablet Take 1 tablet (3.125 mg total) by mouth 2 (two) times daily with a meal.   cetirizine (ZYRTEC) 10 MG tablet TAKE ONE TABLET DAILY   clonazePAM (KLONOPIN) 0.5 MG tablet Take 1 tablet (0.5 mg total) by mouth 2 (two) times daily.   DULoxetine (CYMBALTA) 60 MG capsule Take 1  capsule (60 mg total) by mouth daily.   fluticasone (FLONASE) 50 MCG/ACT nasal spray Place 2 sprays into both nostrils daily.   losartan (COZAAR) 25 MG tablet Take 1 tablet (25 mg total) by mouth daily.   Multiple Vitamins-Minerals (CENTRUM SILVER ULTRA MENS) TABS Take 1 tablet by mouth daily.   Olopatadine HCl (PATADAY OP) Apply to eye.   Omega-3 Fatty Acids (FISH OIL) 1000 MG CAPS Take 1,000 mg by mouth 2 (two) times daily.   Propylene Glycol-Glycerin 0.6-0.6 % SOLN Apply 1 drop to eye daily as needed (dry eyes).   traZODone (DESYREL) 50 MG tablet Take 1 tablet (50 mg total) by mouth at bedtime.   zolpidem (AMBIEN) 5 MG tablet Take 1 tablet (5 mg total) by mouth at bedtime as needed. for sleep   No facility-administered encounter medications on file as of 01/09/2023.    Allergies (verified) Penicillins and Simvastatin   History: Past Medical History:  Diagnosis Date   ACEI/ARB contraindicated    Anxiety    Bronchitis    Cardiomyopathy    Chest pain    left heart catherization   CHF (congestive heart failure) (HCC)    History of cardiac catheterization    HTN (hypertension)    Hyperlipidemia    myalgias with pravastatin and simvastatin.   NHL (non-Hodgkin's lymphoma) ([place])    history of   Obesity 08/16/2011  Sinus infection    Subacute effusive constrictive pericarditis    Past Surgical History:  Procedure Laterality Date   BACK SURGERY  07/2008   tumor removed off spine   heart sack removed  07/2009   HEMORRHOID SURGERY     LEFT HEART CATH  10/10   NASAL SINUS SURGERY     PORT-A-CATH REMOVAL Left 12/13/2016   Procedure: MINOR REMOVAL PORT-A-CATH;  Surgeon: Aviva Signs, MD;  Location: [facility];  Service: General;  Laterality: Left;   PORTACATH PLACEMENT     TUMOR REMOVAL     Family History  Problem Relation Age of Onset   Parkinson's disease Mother    Turner syndrome Sister        questionable   Colon cancer Neg Hx    Social History   Socioeconomic History    Marital status: Married    Spouse name: Not on file   Number of children: Not on file   Years of education: Not on file   Highest education level: Not on file  Occupational History   Not on file  Tobacco Use   Smoking status: Former   Smokeless tobacco: Never   Tobacco comments:    Quit tobacco in 1999 after 35 pyr hx  Substance and Sexual Activity   Alcohol use: No   Drug use: No   Sexual activity: Yes  Other Topics Concern   Not on file  Social History Narrative   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  Chemotherapy, including Adriamycin, completed 3/10.  Radiation to chest/back in early 2010.  PET scan 11/10 with no evidence for recurrent lymphoma.       2. Chest pain: Left heart cath (10/10) with no angiographic coronary disease.      3.Cardiomyopathy: Nonischemic.  EF 35-40% with global hypokinesis by LV-gram 10/10.  Cardiac MRI (10/10) was limited by respiratory artifact but showed a small to moderate free-flowing pericardial effusion with no tamponade, EF 43% with global hypokinesis, no myocardial delayed enhancement (no definite evidence of myocardial infarction, infiltrative disease, or myocarditis). Echo (11/10) with EF 40-45%.  Cardiomyopathy may be secondary to Adriamycin toxicity.  Echo after pericardiectomy in Oct 14, 2023) showed EF 55%.      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  Had free-flowing effusion in 10/10 with no tamponade.  Patient was reimaged by echo in 11/10 showed EF 40-45% (global HK), dilated IVC, and evidence for ventricular interdependence with an organized pericardial effusion.  Hemodynamic right and left heart cath showed mean RA 14, RV 27/18, PA 29/17, mean PCWP 14, LVEDP 18, cardiac index 1.8 => there was equalization of diastolic pressure.  Also, ventricular interdependence was demonstrated by cath.  Patient underwent pericardiectomy in 11/10.  Echo after  pericardiectomy Oct 14, 2023) showed EF 55%, moderate diastolic dysfunction, RV moderately dilated and moderately dysfunctional.  There were still some signs of constrictive physiology (respirophasic interventricular septal variation) but the IVC was small with inspiratory collapse.   Repeat echo 6/11 showed EF 50%, normal RV size and systolic function (improved from October 14, 2023), some septal shift with inspiration but less pronounced than in 10/14/2023, and normal IVC size.       FH-No premature CAD   Positive for HTN, DM.    Sister with Turner's Syndrome         Social Determinants of Health   Financial Resource Strain: Low Risk  (01/09/2023)   Overall Financial Resource Strain (CARDIA)  Difficulty of Paying Living Expenses: Not hard at all  Food Insecurity: No Food Insecurity (01/09/2023)   Hunger Vital Sign    Worried About Running Out of Food in the Last Year: Never true    Ran Out of Food in the Last Year: Never true  Transportation Needs: No Transportation Needs (01/09/2023)   PRAPARE - Hydrologist (Medical): No    Lack of Transportation (Non-Medical): No  Physical Activity: Sufficiently Active (01/09/2023)   Exercise Vital Sign    Days of Exercise per Week: 5 days    Minutes of Exercise per Session: 30 min  Stress: No Stress Concern Present (01/09/2023)   [place]    Feeling of Stress : Not at all  Social Connections: [place] (01/09/2023)   Social Connection and Isolation Panel [NHANES]    Frequency of Communication with Friends and Family: More than three times a week    Frequency of Social Gatherings with Friends and Family: More than three times a week    Attends Religious Services: More than 4 times per year    Active Member of [employer] or Organizations: Yes    Attends Music therapist: More than 4 times per year    Marital Status: Married    Tobacco  Counseling Counseling given: Not Answered Tobacco comments: Quit tobacco in 1999 after 35 pyr hx   Clinical Intake:  Pre-visit preparation completed: Yes  Pain : No/denies pain     Nutritional Risks: None Diabetes: No  How often do you need to have someone help you when you read instructions, pamphlets, or other written materials from your doctor or pharmacy?: 1 - Never  Diabetic?no   Interpreter Needed?: No  Information entered by :: Jadene Pierini, LPN   Activities of Daily Living    01/09/2023    8:52 AM  In your present state of health, do you have any difficulty performing the following activities:  Hearing? 0  Vision? 0  Difficulty concentrating or making decisions? 0  Walking or climbing stairs? 0  Dressing or bathing? 0  Doing errands, shopping? 0  Preparing Food and eating ? N  Using the Toilet? N  In the past six months, have you accidently leaked urine? N  Do you have problems with loss of bowel control? N  Managing your Medications? N  Managing your Finances? N  Housekeeping or managing your Housekeeping? N    Patient Care Team: [name], FNP as PCP - General (Nurse Practitioner) Aviva Signs, MD as Consulting Physician (General Surgery)  Indicate any recent Medical Services you may have received from other than [facility] providers in the past year (date may be approximate).     Assessment:   This is a routine wellness examination for [employer].  Hearing/Vision screen Vision Screening - Comments:: Wears rx glasses - up to date with routine eye exams with  Dr.Cotter   Dietary issues and exercise activities discussed: Current Exercise Habits: Home exercise routine, Type of exercise: walking, Time (Minutes): 30, Frequency (Times/Week): 5, Weekly Exercise (Minutes/Week): 150, Intensity: Mild, Exercise limited by: None identified   Goals Addressed             This Visit's Progress    Exercise 3x per week (30 min per time)          Depression Screen    01/09/2023    8:51 AM 12/10/2022    8:04 AM 06/11/2022  8:52 AM 12/11/2021    9:15 AM 06/12/2021   10:44 AM 12/13/2020   10:05 AM 09/12/2020   10:09 AM  PHQ 2/9 Scores  PHQ - 2 Score 0 1 0 0 0 0 0  PHQ- 9 Score 0 2 1 3  0      Fall Risk    01/09/2023    8:50 AM 12/10/2022    8:04 AM 06/11/2022    8:52 AM 12/11/2021    9:13 AM 06/12/2021   10:44 AM  Fall Risk   Falls in the past year? 0 1 1 0 1  Number falls in past yr: 0 1 0  0  Injury with Fall? 0 0 0  0  Risk for fall due to : No Fall Risks History of fall(s) History of fall(s)    Follow up Falls prevention discussed Education provided Education provided      FALL RISK PREVENTION PERTAINING TO THE HOME:  Any stairs in or around the home? Yes  If so, are there any without handrails? No  Home free of loose throw rugs in walkways, pet beds, electrical cords, etc? Yes  Adequate lighting in your home to reduce risk of falls? Yes   ASSISTIVE DEVICES UTILIZED TO PREVENT FALLS:  Life alert? No  Use of a cane, walker or w/c? No  Grab bars in the bathroom? No  Shower chair or bench in shower? No  Elevated toilet seat or a handicapped toilet? No          01/09/2023    8:53 AM  6CIT Screen  What Year? 0 points  What month? 0 points  What time? 0 points  Count back from 20 0 points  Months in reverse 0 points  Repeat phrase 0 points  Total Score 0 points    Immunizations Immunization History  Administered Date(s) Administered   Covid-19, Mrna,Vaccine(Spikevax)70yrs and older 09/10/2022   Fluad Quad(high Dose 65+) 08/14/2022   Influenza Whole 07/31/2010   Influenza,inj,Quad PF,6+ Mos 11/17/2013, 07/12/2014, 08/02/2015, 08/13/2016, 07/30/2017, 08/20/2018, 09/12/2020   Influenza-Unspecified 07/16/2021   Moderna Sars-Covid-2 Vaccination 01/04/2020, 02/01/2020, 09/19/2020, 04/10/2021   Pneumococcal Conjugate-13 11/14/2016   Tdap 05/22/2011, 06/12/2021    TDAP status: Up to date  Flu Vaccine  status: Up to date  Pneumococcal vaccine status: Up to date  Covid-19 vaccine status: Completed vaccines  Qualifies for Shingles Vaccine? Yes   Zostavax completed No   Shingrix Completed?: No.    Education has been provided regarding the importance of this vaccine. Patient has been advised to call insurance company to determine out of pocket expense if they have not yet received this vaccine. Advised may also receive vaccine at local pharmacy or Health Dept. Verbalized acceptance and understanding.  Screening Tests Health Maintenance  Topic Date Due   Diabetic kidney evaluation - Urine ACR  Never done   COVID-19 Vaccine (6 - 2023-24 season) 11/05/2022   Zoster Vaccines- Shingrix (1 of 2) 03/11/2023 (Originally 01/04/1975)   Pneumonia Vaccine 14+ Years old (2 of 2 - PPSV23 or PCV20) 06/12/2023 (Originally 01/03/2022)   Diabetic kidney evaluation - eGFR measurement  12/11/2023   Medicare Annual Wellness (AWV)  01/09/2024   Fecal DNA (Cologuard)  07/10/2025   DTaP/Tdap/Td (3 - Td or Tdap) 06/13/2031   INFLUENZA VACCINE  Completed   Hepatitis C Screening  Completed   HPV VACCINES  Aged Out    Health Maintenance  Health Maintenance Due  Topic Date Due   Diabetic kidney evaluation - Urine ACR  Never done   COVID-19 Vaccine (6 - 2023-24 season) 11/05/2022    Colorectal cancer screening: Type of screening: Cologuard. Completed 07/10/2022. Repeat every 3 years  Lung Cancer Screening: (Low Dose CT Chest recommended if Age 33-80 years, 30 pack-year currently smoking OR have quit w/in 15years.) does not qualify.   Lung Cancer Screening Referral: n/a  Additional Screening:  Hepatitis C Screening: does not qualify; Completed 09/15/2015  Vision Screening: Recommended annual ophthalmology exams for early detection of glaucoma and other disorders of the eye. Is the patient up to date with their annual eye exam?  Yes  Who is the provider or what is the name of the office in which the  patient attends annual eye exams? Dr.Cotter  If pt is not established with a provider, would they like to be referred to a provider to establish care? No .   Dental Screening: Recommended annual dental exams for proper oral hygiene  Community Resource Referral / Chronic Care Management: CRR required this visit?  No   CCM required this visit?  No      Plan:     I have personally reviewed and noted the following in the patient's chart:   Medical and social history Use of alcohol, tobacco or illicit drugs  Current medications and supplements including opioid prescriptions. Patient is not currently taking opioid prescriptions. Functional ability and status Nutritional status Physical activity Advanced directives List of other physicians Hospitalizations, surgeries, and ER visits in previous 12 months Vitals Screenings to include cognitive, depression, and falls Referrals and appointments  In addition, I have reviewed and discussed with patient certain preventive protocols, quality metrics, and best practice recommendations. A written personalized care plan for preventive services as well as general preventive health recommendations were provided to patient.     Daphane Shepherd, LPN   [id number]   Nurse Notes: none

## 2023-01-09 NOTE — Patient Instructions (Signed)
Matthew Irwin , Thank you for taking time to come for your Medicare Wellness Visit. I appreciate your ongoing commitment to your health goals. Please review the following plan we discussed and let me know if I can assist you in the future.   These are the goals we discussed:  Goals      Exercise 3x per week (30 min per time)        This is a list of the screening recommended for you and due dates:  Health Maintenance  Topic Date Due   Yearly kidney health urinalysis for diabetes  Never done   COVID-19 Vaccine (6 - 2023-24 season) 11/05/2022   Zoster (Shingles) Vaccine (1 of 2) 03/11/2023*   Pneumonia Vaccine (2 of 2 - PPSV23 or PCV20) 06/12/2023*   Yearly kidney function blood test for diabetes  12/11/2023   Medicare Annual Wellness Visit  01/09/2024   Cologuard (Stool DNA test)  07/10/2025   DTaP/Tdap/Td vaccine (3 - Td or Tdap) 06/13/2031   Flu Shot  Completed   Hepatitis C Screening: USPSTF Recommendation to screen - Ages 18-79 yo.  Completed   HPV Vaccine  Aged Out  *Topic was postponed. The date shown is not the original due date.    Advanced directives: Advance directive discussed with you today. I have provided a copy for you to complete at home and have notarized. Once this is complete please bring a copy in to our office so we can scan it into your chart.   Conditions/risks identified: Aim for 30 minutes of exercise or brisk walking, 6-8 glasses of water, and 5 servings of fruits and vegetables each day.   Next appointment: Follow up in one year for your annual wellness visit.   Preventive Care 66 Years and Older, Male  Preventive care refers to lifestyle choices and visits with your health care provider that can promote health and wellness. What does preventive care include? A yearly physical exam. This is also called an annual well check. Dental exams once or twice a year. Routine eye exams. Ask your health care provider how often you should have your eyes  checked. Personal lifestyle choices, including: Daily care of your teeth and gums. Regular physical activity. Eating a healthy diet. Avoiding tobacco and drug use. Limiting alcohol use. Practicing safe sex. Taking low doses of aspirin every day. Taking vitamin and mineral supplements as recommended by your health care provider. What happens during an annual well check? The services and screenings done by your health care provider during your annual well check will depend on your age, overall health, lifestyle risk factors, and family history of disease. Counseling  Your health care provider may ask you questions about your: Alcohol use. Tobacco use. Drug use. Emotional well-being. Home and relationship well-being. Sexual activity. Eating habits. History of falls. Memory and ability to understand (cognition). Work and work Statistician. Screening  You may have the following tests or measurements: Height, weight, and BMI. Blood pressure. Lipid and cholesterol levels. These may be checked every 5 years, or more frequently if you are over 19 years old. Skin check. Lung cancer screening. You may have this screening every year starting at age 51 if you have a 30-pack-year history of smoking and currently smoke or have quit within the past 15 years. Fecal occult blood test (FOBT) of the stool. You may have this test every year starting at age 60. Flexible sigmoidoscopy or colonoscopy. You may have a sigmoidoscopy every 5 years or a colonoscopy every  10 years starting at age 56. Prostate cancer screening. Recommendations will vary depending on your family history and other risks. Hepatitis C blood test. Hepatitis B blood test. Sexually transmitted disease (STD) testing. Diabetes screening. This is done by checking your blood sugar (glucose) after you have not eaten for a while (fasting). You may have this done every 1-3 years. Abdominal aortic aneurysm (AAA) screening. You may need this  if you are a current or former smoker. Osteoporosis. You may be screened starting at age 28 if you are at high risk. Talk with your health care provider about your test results, treatment options, and if necessary, the need for more tests. Vaccines  Your health care provider may recommend certain vaccines, such as: Influenza vaccine. This is recommended every year. Tetanus, diphtheria, and acellular pertussis (Tdap, Td) vaccine. You may need a Td booster every 10 years. Zoster vaccine. You may need this after age 39. Pneumococcal 13-valent conjugate (PCV13) vaccine. One dose is recommended after age 27. Pneumococcal polysaccharide (PPSV23) vaccine. One dose is recommended after age 73. Talk to your health care provider about which screenings and vaccines you need and how often you need them. This information is not intended to replace advice given to you by your health care provider. Make sure you discuss any questions you have with your health care provider. Document Released: 11/03/2015 Document Revised: 06/26/2016 Document Reviewed: 08/08/2015 Elsevier Interactive Patient Education  2017 [employer] Prevention in the Home Falls can cause injuries. They can happen to people of all ages. There are many things you can do to make your home safe and to help prevent falls. What can I do on the outside of my home? Regularly fix the edges of walkways and driveways and fix any cracks. Remove anything that might make you trip as you walk through a door, such as a raised step or threshold. Trim any bushes or trees on the path to your home. Use bright outdoor lighting. Clear any walking paths of anything that might make someone trip, such as rocks or tools. Regularly check to see if handrails are loose or broken. Make sure that both sides of any steps have handrails. Any raised decks and porches should have guardrails on the edges. Have any leaves, snow, or ice cleared regularly. Use sand  or salt on walking paths during winter. Clean up any spills in your garage right away. This includes oil or grease spills. What can I do in the bathroom? Use night lights. Install grab bars by the toilet and in the tub and shower. Do not use towel bars as grab bars. Use non-skid mats or decals in the tub or shower. If you need to sit down in the shower, use a plastic, non-slip stool. Keep the floor dry. Clean up any water that spills on the floor as soon as it happens. Remove soap buildup in the tub or shower regularly. Attach bath mats securely with double-sided non-slip rug tape. Do not have throw rugs and other things on the floor that can make you trip. What can I do in the bedroom? Use night lights. Make sure that you have a light by your bed that is easy to reach. Do not use any sheets or blankets that are too big for your bed. They should not hang down onto the floor. Have a firm chair that has side arms. You can use this for support while you get dressed. Do not have throw rugs and other things on the  floor that can make you trip. What can I do in the kitchen? Clean up any spills right away. Avoid walking on wet floors. Keep items that you use a lot in easy-to-reach places. If you need to reach something above you, use a strong step stool that has a grab bar. Keep electrical cords out of the way. Do not use floor polish or wax that makes floors slippery. If you must use wax, use non-skid floor wax. Do not have throw rugs and other things on the floor that can make you trip. What can I do with my stairs? Do not leave any items on the stairs. Make sure that there are handrails on both sides of the stairs and use them. Fix handrails that are broken or loose. Make sure that handrails are as long as the stairways. Check any carpeting to make sure that it is firmly attached to the stairs. Fix any carpet that is loose or worn. Avoid having throw rugs at the top or bottom of the stairs.  If you do have throw rugs, attach them to the floor with carpet tape. Make sure that you have a light switch at the top of the stairs and the bottom of the stairs. If you do not have them, ask someone to add them for you. What else can I do to help prevent falls? Wear shoes that: Do not have high heels. Have rubber bottoms. Are comfortable and fit you well. Are closed at the toe. Do not wear sandals. If you use a stepladder: Make sure that it is fully opened. Do not climb a closed stepladder. Make sure that both sides of the stepladder are locked into place. Ask someone to hold it for you, if possible. Clearly mark and make sure that you can see: Any grab bars or handrails. First and last steps. Where the edge of each step is. Use tools that help you move around (mobility aids) if they are needed. These include: Canes. Walkers. Scooters. Crutches. Turn on the lights when you go into a dark area. Replace any light bulbs as soon as they burn out. Set up your furniture so you have a clear path. Avoid moving your furniture around. If any of your floors are uneven, fix them. If there are any pets around you, be aware of where they are. Review your medicines with your doctor. Some medicines can make you feel dizzy. This can increase your chance of falling. Ask your doctor what other things that you can do to help prevent falls. This information is not intended to replace advice given to you by your health care provider. Make sure you discuss any questions you have with your health care provider. Document Released: 08/03/2009 Document Revised: 03/14/2016 Document Reviewed: 11/11/2014 Elsevier Interactive Patient Education  2017 [employer].

## 2023-01-15 ENCOUNTER — Ambulatory Visit (INDEPENDENT_AMBULATORY_CARE_PROVIDER_SITE_OTHER): Payer: Medicare Other | Admitting: Family Medicine

## 2023-01-15 ENCOUNTER — Encounter: Payer: Self-pay | Admitting: Family Medicine

## 2023-01-15 VITALS — BP 90/56 | HR 80 | Temp 97.5°F | Ht 70.0 in | Wt 288.2 lb

## 2023-01-15 DIAGNOSIS — H6691 Otitis media, unspecified, right ear: Secondary | ICD-10-CM

## 2023-01-15 DIAGNOSIS — I10 Essential (primary) hypertension: Secondary | ICD-10-CM

## 2023-01-15 DIAGNOSIS — J0140 Acute pansinusitis, unspecified: Secondary | ICD-10-CM

## 2023-01-15 MED ORDER — PROMETHAZINE-DM 6.25-15 MG/5ML PO SYRP: 5.0000 mL | ORAL_SOLUTION | Freq: Four times a day (QID) | ORAL | 0 refills | Status: DC | PRN

## 2023-01-15 MED ORDER — CEFDINIR 300 MG PO CAPS: 300.0000 mg | ORAL_CAPSULE | Freq: Two times a day (BID) | ORAL | 0 refills | Status: DC

## 2023-01-15 NOTE — Progress Notes (Signed)
Acute Office Visit  Subjective:     Patient ID: Matthew Irwin, male    DOB: Feb 23, 1956, 67 y.o.   MRN: [id number]  Chief Complaint  Patient presents with   Sinusitis    Sinusitis This is a new problem. Episode onset: 10-11 days. The problem is unchanged. There has been no fever. Associated symptoms include congestion, coughing (yellow sputum at times), headaches, sinus pressure and a sore throat (irritation). Pertinent negatives include no chills, ear pain, hoarse voice, shortness of breath or sneezing. (Postnasal drip) Past treatments include nasal decongestants and saline nose sprays (flonae, claritin, cough syrup). The treatment provided mild relief.   BP at home was 128/78. Denies chest pain, dizziness, palpitations, weakness, edema, or lightheadedness.   Review of Systems  Constitutional:  Negative for chills.  HENT:  Positive for congestion, sinus pressure and sore throat (irritation). Negative for ear pain, hoarse voice and sneezing.   Respiratory:  Positive for cough (yellow sputum at times). Negative for shortness of breath.   Neurological:  Positive for headaches.        Objective:    BP (!) 90/56   Pulse 80   Temp (!) 97.5 F (36.4 C) (Temporal)   Ht 5\' 10"  (1.778 m)   Wt 288 lb 4 oz (130.7 kg)   SpO2 92%   BMI 41.36 kg/m  BP Readings from Last 3 Encounters:  01/15/23 (!) 90/56  12/10/22 111/76  06/11/22 119/80      Physical Exam Vitals and nursing note reviewed.  Constitutional:      General: He is not in acute distress.    Appearance: He is obese. He is not ill-appearing, toxic-appearing or diaphoretic.  HENT:     Right Ear: Ear canal and external ear normal. A middle ear effusion is present. Tympanic membrane is erythematous and bulging. Tympanic membrane is not perforated or retracted.     Left Ear: Ear canal and external ear normal. A middle ear effusion is present. Tympanic membrane is not perforated, erythematous, retracted or bulging.     Nose:  Congestion present.     Mouth/Throat:     Mouth: Mucous membranes are moist.     Pharynx: No pharyngeal swelling or posterior oropharyngeal erythema.     Tonsils: No tonsillar exudate or tonsillar abscesses. 1+ on the right. 1+ on the left.  Cardiovascular:     Rate and Rhythm: Normal rate and regular rhythm.     Heart sounds: No murmur heard. Pulmonary:     Effort: Pulmonary effort is normal.     Breath sounds: Normal breath sounds.  Musculoskeletal:     Cervical back: Neck supple. No rigidity.     Right lower leg: No edema.     Left lower leg: No edema.  Lymphadenopathy:     Cervical: No cervical adenopathy.  Skin:    General: Skin is warm and dry.  Neurological:     General: No focal deficit present.     Mental Status: He is alert and oriented to person, place, and time.  Psychiatric:        Mood and Affect: Mood normal.        Behavior: Behavior normal.     No results found for any visits on 01/15/23.      Assessment & Plan:   Khaalid was seen today for sinusitis.  Diagnoses and all orders for this visit:  Right acute otitis media Omnicef as below.  -     cefdinir (OMNICEF) 300 MG  capsule; Take 1 capsule (300 mg total) by mouth 2 (two) times daily. 1 po BID  Acute non-recurrent pansinusitis Discussed symptomatic care and return precautions.  -     cefdinir (OMNICEF) 300 MG capsule; Take 1 capsule (300 mg total) by mouth 2 (two) times daily. 1 po BID -     promethazine-dextromethorphan (PROMETHAZINE-DM) 6.25-15 MG/5ML syrup; Take 5 mLs by mouth 4 (four) times daily as needed for cough.  Primary hypertension BP is low today. Has been normal at home. Denies symptoms. Discussed hydration. Monitor at home and notify for high or low readings.    Return to office for new or worsening symptoms, or if symptoms persist.   The patient indicates understanding of these issues and agrees with the plan.  Gwenlyn Perking, FNP

## 2023-03-03 ENCOUNTER — Encounter: Payer: Self-pay | Admitting: Nurse Practitioner

## 2023-03-03 ENCOUNTER — Ambulatory Visit (INDEPENDENT_AMBULATORY_CARE_PROVIDER_SITE_OTHER): Payer: Medicare Other | Admitting: Nurse Practitioner

## 2023-03-03 VITALS — BP 120/78 | HR 86 | Ht 70.0 in | Wt 289.0 lb

## 2023-03-03 DIAGNOSIS — I10 Essential (primary) hypertension: Secondary | ICD-10-CM | POA: Diagnosis not present

## 2023-03-03 DIAGNOSIS — F411 Generalized anxiety disorder: Secondary | ICD-10-CM

## 2023-03-03 DIAGNOSIS — I509 Heart failure, unspecified: Secondary | ICD-10-CM

## 2023-03-03 DIAGNOSIS — R739 Hyperglycemia, unspecified: Secondary | ICD-10-CM | POA: Diagnosis not present

## 2023-03-03 DIAGNOSIS — E785 Hyperlipidemia, unspecified: Secondary | ICD-10-CM | POA: Diagnosis not present

## 2023-03-03 DIAGNOSIS — I110 Hypertensive heart disease with heart failure: Secondary | ICD-10-CM

## 2023-03-03 DIAGNOSIS — C8339 Diffuse large B-cell lymphoma, extranodal and solid organ sites: Secondary | ICD-10-CM

## 2023-03-03 DIAGNOSIS — F5101 Primary insomnia: Secondary | ICD-10-CM

## 2023-03-03 LAB — CMP14+EGFR
ALT: 45 IU/L — ABNORMAL HIGH (ref 0–44)
AST: 43 IU/L — ABNORMAL HIGH (ref 0–40)
Albumin/Globulin Ratio: 1.5 (ref 1.2–2.2)
Albumin: 4.1 g/dL (ref 3.9–4.9)
Alkaline Phosphatase: 87 IU/L (ref 44–121)
BUN/Creatinine Ratio: 15 (ref 10–24)
BUN: 15 mg/dL (ref 8–27)
Bilirubin Total: 0.8 mg/dL (ref 0.0–1.2)
CO2: 26 mmol/L (ref 20–29)
Calcium: 9.5 mg/dL (ref 8.6–10.2)
Chloride: 102 mmol/L (ref 96–106)
Creatinine, Ser: 0.98 mg/dL (ref 0.76–1.27)
Globulin, Total: 2.7 g/dL (ref 1.5–4.5)
Glucose: 133 mg/dL — ABNORMAL HIGH (ref 70–99)
Potassium: 4.5 mmol/L (ref 3.5–5.2)
Sodium: 140 mmol/L (ref 134–144)
Total Protein: 6.8 g/dL (ref 6.0–8.5)
eGFR: 85 mL/min/{1.73_m2} (ref >59)

## 2023-03-03 LAB — CBC WITH DIFFERENTIAL/PLATELET
Basophils Absolute: 0 10*3/uL (ref 0.0–0.2)
Basos: 0 %
EOS (ABSOLUTE): 0.3 10*3/uL (ref 0.0–0.4)
Eos: 5 %
Hematocrit: 41.2 % (ref 37.5–51.0)
Hemoglobin: 13.9 g/dL (ref 13.0–17.7)
Immature Grans (Abs): 0 10*3/uL (ref 0.0–0.1)
Immature Granulocytes: 0 %
Lymphocytes Absolute: 1.3 10*3/uL (ref 0.7–3.1)
Lymphs: 24 %
MCH: 31.4 pg (ref 26.6–33.0)
MCHC: 33.7 g/dL (ref 31.5–35.7)
MCV: 93 fL (ref 79–97)
Monocytes Absolute: 0.5 10*3/uL (ref 0.1–0.9)
Monocytes: 9 %
Neutrophils Absolute: 3.3 10*3/uL (ref 1.4–7.0)
Neutrophils: 62 %
Platelets: 178 10*3/uL (ref 150–450)
RBC: 4.42 x10E6/uL (ref 4.14–5.80)
RDW: 11.5 % — ABNORMAL LOW (ref 11.6–15.4)
WBC: 5.3 10*3/uL (ref 3.4–10.8)

## 2023-03-03 LAB — LIPID PANEL
Chol/HDL Ratio: 3.6 ratio (ref 0.0–5.0)
Cholesterol, Total: 135 mg/dL (ref 100–199)
HDL: 38 mg/dL — ABNORMAL LOW (ref >39)
LDL Chol Calc (NIH): 71 mg/dL (ref 0–99)
Triglycerides: 152 mg/dL — ABNORMAL HIGH (ref 0–149)
VLDL Cholesterol Cal: 26 mg/dL (ref 5–40)

## 2023-03-03 LAB — BAYER DCA HB A1C WAIVED: HB A1C (BAYER DCA - WAIVED): 5.7 % — ABNORMAL HIGH (ref 4.8–5.6)

## 2023-03-03 MED ORDER — DULOXETINE HCL 60 MG PO CPEP: 60.0000 mg | ORAL_CAPSULE | Freq: Every day | ORAL | 1 refills | Status: DC

## 2023-03-03 MED ORDER — CARVEDILOL 3.125 MG PO TABS: 3.1250 mg | ORAL_TABLET | Freq: Two times a day (BID) | ORAL | 1 refills | Status: DC

## 2023-03-03 MED ORDER — CLONAZEPAM 0.5 MG PO TABS: 0.5000 mg | ORAL_TABLET | Freq: Two times a day (BID) | ORAL | 5 refills | Status: DC

## 2023-03-03 MED ORDER — ATORVASTATIN CALCIUM 40 MG PO TABS: 40.0000 mg | ORAL_TABLET | Freq: Every day | ORAL | 1 refills | Status: DC

## 2023-03-03 MED ORDER — ZOLPIDEM TARTRATE 5 MG PO TABS
5.0000 mg | ORAL_TABLET | Freq: Every evening | ORAL | 5 refills | Status: DC | PRN
Start: 2023-03-03 — End: 2023-09-02

## 2023-03-03 MED ORDER — LOSARTAN POTASSIUM 25 MG PO TABS: 25.0000 mg | ORAL_TABLET | Freq: Every day | ORAL | 1 refills | Status: DC

## 2023-03-03 MED ORDER — TRAZODONE HCL 50 MG PO TABS
50.0000 mg | ORAL_TABLET | Freq: Every day | ORAL | 1 refills | Status: DC
Start: 2023-03-03 — End: 2023-09-02

## 2023-03-03 NOTE — Patient Instructions (Signed)
How to Increase Your Level of Physical Activity Getting regular physical activity is important for your overall health and well-being. Most people do not get enough exercise. There are easy ways to increase your level of physical activity, even if you have not been very active in the past or if you are just starting out. What are the benefits of physical activity? Physical activity has many short-term and long-term benefits. Being active on a regular basis can improve your physical and mental health as well as provide other benefits. Physical health benefits Helping you lose weight or maintain a healthy weight. Strengthening your muscles and bones. Reducing your risk of certain long-term (chronic) diseases, including heart disease, cancer, and diabetes. Being able to move around more easily and for longer periods of time without getting tired (increased endurance or stamina). Improving your ability to fight off illness (enhanced immunity). Being able to sleep better. Helping you stay healthy as you get older, including: Helping you stay mobile, or capable of walking and moving around. Preventing accidents, such as falls. Increasing life expectancy. Mental health benefits Boosting your mood and improving your self-esteem. Lowering your chance of having mental health problems, such as depression or anxiety. Helping you feel good about your body. Other benefits Finding new sources of fun and enjoyment. Meeting new people who share a common interest. Before you begin If you have a chronic illness or have not been active for a while, check with your health care provider about how to get started. Ask your health care provider what activities are safe for you. Start out slowly. Walking or doing some simple chair exercises is a good place to start, especially if you have not been active before or for a long time. Set goals that you can work toward. Ask your health care provider how much exercise is  best for you. In general, most adults should: Do moderate-intensity exercise for at least 150 minutes each week (30 minutes on most days of the week) or vigorous exercise for at least 75 minutes each week, or a combination of these. Moderate-intensity exercise can include walking at a quick pace, biking, yoga, water aerobics, or gardening. Vigorous exercise involves activities that take more effort, such as jogging or running, playing sports, swimming laps, or jumping rope. Do strength exercises on at least 2 days each week. This can include weight lifting, body weight exercises, and resistance-band exercises. How to be more physically active Make a plan  Try to find activities that you enjoy. You are more likely to commit to an exercise routine if it does not feel like a chore. If you have bone or joint problems, choose low-impact exercises, like walking or swimming. Use these tips for being successful with an exercise plan: Find a workout partner for accountability. Join a group or class, such as an aerobics class, cycling class, or sports team. Make family time active. Go for a walk, bike, or swim. Include a variety of exercises each week. Consider using a fitness tracker, such as a mobile phone app or a device worn like a watch, that will count the number of steps you take each day. Many people strive to reach 10,000 steps a day. Find ways to be active in your daily routines Besides your formal exercise plans, you can find ways to do physical activity during your daily routines, such as: Walking or biking to work or to the store. Taking the stairs instead of the elevator. Parking farther away from the door at work   or at the store. Planning walking meetings. Walking around while you are on the phone. Where to find more information Centers for Disease Control and Prevention: www.cdc.gov/physicalactivity [employer]'s [employer] on Fitness, Sports & Nutrition: www.fitness.gov ChooseMyPlate:  www.myplate.gov Contact a health care provider if: You have headaches, muscle aches, or joint pain that is concerning. You feel dizzy or light-headed while exercising. You faint. You feel your heart skipping, racing, or fluttering. You have chest pain while exercising. Summary Exercise benefits your mind and body at any age, even if you are just starting out. If you have a chronic illness or have not been active for a while, check with your health care provider before increasing your physical activity. Choose activities that are safe and enjoyable for you. Ask your health care provider what activities are safe for you. Start slowly. Tell your health care provider if you have problems as you start to increase your activity level. This information is not intended to replace advice given to you by your health care provider. Make sure you discuss any questions you have with your health care provider. Document Revised: 02/02/2021 Document Reviewed: 02/02/2021 Elsevier Patient Education  2023 Elsevier Inc.  

## 2023-03-03 NOTE — Progress Notes (Signed)
Subjective:    Patient ID: Matthew Irwin, male    DOB: January 13, 1956, 67 y.o.   MRN: [id number]   Chief Complaint: medical management of chronic issues     HPI:  Matthew Irwin is a 67 y.o. who identifies as a male who was assigned male at birth.   Social history: Lives with: wife Work history: Soil scientist   Comes in today for follow up of the following chronic medical issues:  1. Primary hypertension No c/o chest pain, sob or headache. Does not check blood pressure at home. BP Readings from Last 3 Encounters:  01/15/23 (!) 90/56  12/10/22 111/76  06/11/22 119/80     2. Hyperlipidemia with target LDL less than 100 Does not really watch diet and does no dedicated exercises. Lab Results  Component Value Date   CHOL 137 12/10/2022   HDL 41 12/10/2022   LDLCALC 67 12/10/2022   TRIG 172 (H) 12/10/2022   CHOLHDL 3.3 12/10/2022     3. Chronic congestive heart failure, unspecified heart failure type [facility]) Doing well. Denies any SOB. Has not seen cardiology  in awhile.  4. GAD (generalized anxiety disorder) No recent stress changes. Is on klonopin and cymbalta    01/15/2023    8:31 AM 12/10/2022    8:05 AM 06/11/2022    8:53 AM 12/11/2021    9:15 AM  GAD 7 : Generalized Anxiety Score  Nervous, Anxious, on Edge 0 0 0 1  Control/stop worrying 0 0 0 0  Worry too much - different things 0 0 0 0  Trouble relaxing 0 0 0 0  Restless 0 0 0 0  Easily annoyed or irritable 0 0 0 0  Afraid - awful might happen 0 0 0 0  Total GAD 7 Score 0 0 0 1  Anxiety Difficulty Not difficult at all Not difficult at all Not difficult at all Not difficult at all      5. Primary insomnia Is  on trazadone to sleep and is sleeping well.  6. 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 They released hin  to not return unless has some issues. Last spine xray on 12/10/22- showed no issues to speak of.  7. Severe obesity (BMI >= 40)  (HCC) No recent weight changes. Wt Readings from Last 3 Encounters:  03/03/23 289 lb (131.1 kg)  01/15/23 288 lb 4 oz (130.7 kg)  01/09/23 295 lb (133.8 kg)   BMI Readings from Last 3 Encounters:  03/03/23 41.47 kg/m  01/15/23 41.36 kg/m  01/09/23 42.33 kg/m     New complaints: None today  Allergies  Allergen Reactions   Penicillins Hives    Has patient had a PCN reaction causing immediate rash, facial/tongue/throat swelling, SOB or lightheadedness with hypotension: No Has patient had a PCN reaction causing severe rash involving mucus membranes or skin necrosis: Yes Has patient had a PCN reaction that required hospitalization No Has patient had a PCN reaction occurring within the last 10 years: No If all of the above answers are "NO", then may proceed with Cephalosporin use.    Simvastatin     REACTION: myalgia   Outpatient Encounter Medications as of 03/03/2023  Medication Sig   albuterol (VENTOLIN HFA) 108 (90 Base) MCG/ACT inhaler Inhale 2 puffs into the lungs every 6 (six) hours as needed for wheezing or shortness of breath. (Patient not taking: Reported on 01/15/2023)   aspirin 81 MG tablet Take  81 mg by mouth at bedtime.   atorvastatin (LIPITOR) 40 MG tablet Take 1 tablet (40 mg total) by mouth daily at 6 PM.   carvedilol (COREG) 3.125 MG tablet Take 1 tablet (3.125 mg total) by mouth 2 (two) times daily with a meal.   cefdinir (OMNICEF) 300 MG capsule Take 1 capsule (300 mg total) by mouth 2 (two) times daily. 1 po BID   clonazePAM (KLONOPIN) 0.5 MG tablet Take 1 tablet (0.5 mg total) by mouth 2 (two) times daily.   DULoxetine (CYMBALTA) 60 MG capsule Take 1 capsule (60 mg total) by mouth daily.   fluticasone (FLONASE) 50 MCG/ACT nasal spray Place 2 sprays into both nostrils daily.   loratadine (CLARITIN) 10 MG tablet Take 10 mg by mouth daily.   losartan (COZAAR) 25 MG tablet Take 1 tablet (25 mg total) by mouth daily.   Multiple Vitamins-Minerals (CENTRUM SILVER  ULTRA MENS) TABS Take 1 tablet by mouth daily.   Olopatadine HCl (PATADAY OP) Apply to eye.   Omega-3 Fatty Acids (FISH OIL) 1000 MG CAPS Take 1,000 mg by mouth 2 (two) times daily.   promethazine-dextromethorphan (PROMETHAZINE-DM) 6.25-15 MG/5ML syrup Take 5 mLs by mouth 4 (four) times daily as needed for cough.   Propylene Glycol-Glycerin 0.6-0.6 % SOLN Apply 1 drop to eye daily as needed (dry eyes).   traZODone (DESYREL) 50 MG tablet Take 1 tablet (50 mg total) by mouth at bedtime.   zolpidem (AMBIEN) 5 MG tablet Take 1 tablet (5 mg total) by mouth at bedtime as needed. for sleep   No facility-administered encounter medications on file as of 03/03/2023.    Past Surgical History:  Procedure Laterality Date   BACK SURGERY  07/2008   tumor removed off spine   heart sack removed  07/2009   HEMORRHOID SURGERY     LEFT HEART CATH  10/10   NASAL SINUS SURGERY     PORT-A-CATH REMOVAL Left 12/13/2016   Procedure: MINOR REMOVAL PORT-A-CATH;  Surgeon: Franky Macho, MD;  Location: [facility];  Service: General;  Laterality: Left;   PORTACATH PLACEMENT     TUMOR REMOVAL      Family History  Problem Relation Age of Onset   Parkinson's disease Mother    Turner syndrome Sister        questionable   Colon cancer Neg Hx       Controlled substance contract: n/a     Review of Systems  Constitutional:  Negative for diaphoresis.  Eyes:  Negative for pain.  Respiratory:  Negative for shortness of breath.   Cardiovascular:  Negative for chest pain, palpitations and leg swelling.  Gastrointestinal:  Negative for abdominal pain.  Endocrine: Negative for polydipsia.  Skin:  Negative for rash.  Neurological:  Negative for dizziness, weakness and headaches.  Hematological:  Does not bruise/bleed easily.  All other systems reviewed and are negative.      Objective:   Physical Exam Vitals and nursing note reviewed.  Constitutional:      Appearance: Normal appearance. He is well-developed.   HENT:     Head: Normocephalic.     Nose: Nose normal.     Mouth/Throat:     Mouth: Mucous membranes are moist.     Pharynx: Oropharynx is clear.  Eyes:     Pupils: Pupils are equal, round, and reactive to light.  Neck:     Thyroid: No thyroid mass or thyromegaly.     Vascular: No carotid bruit or JVD.  Trachea: Phonation normal.  Cardiovascular:     Rate and Rhythm: Normal rate and regular rhythm.  Pulmonary:     Effort: Pulmonary effort is normal. No respiratory distress.     Breath sounds: Normal breath sounds.  Abdominal:     General: Bowel sounds are normal.     Palpations: Abdomen is soft.     Tenderness: There is no abdominal tenderness.  Musculoskeletal:        General: Normal range of motion.     Cervical back: Normal range of motion and neck supple.  Lymphadenopathy:     Cervical: No cervical adenopathy.  Skin:    General: Skin is warm and dry.  Neurological:     Mental Status: He is alert and oriented to person, place, and time.  Psychiatric:        Behavior: Behavior normal.        Thought Content: Thought content normal.        Judgment: Judgment normal.    BP 120/78   Pulse 86   Ht 5\' 10"  (1.778 m)   Wt 289 lb (131.1 kg)   SpO2 93%   BMI 41.47 kg/m         Assessment & Plan:   Matthew Irwin comes in today with chief complaint of Medical Management of Chronic Issues, Hypertension, and Hyperlipidemia   Diagnosis and orders addressed:  1. Primary hypertension Low sodium diet - carvedilol (COREG) 3.125 MG tablet; Take 1 tablet (3.125 mg total) by mouth 2 (two) times daily with a meal.  Dispense: 180 tablet; Refill: 1 - losartan (COZAAR) 25 MG tablet; Take 1 tablet (25 mg total) by mouth daily.  Dispense: 90 tablet; Refill: 1  2. Hyperlipidemia with target LDL less than 100 Low fat diet - atorvastatin (LIPITOR) 40 MG tablet; Take 1 tablet (40 mg total) by mouth daily at 6 PM.  Dispense: 90 tablet; Refill: 1  3. Chronic congestive heart  failure, unspecified heart failure type (HCC) Report any SOB  4. GAD (generalized anxiety disorder) Stress management - clonazePAM (KLONOPIN) 0.5 MG tablet; Take 1 tablet (0.5 mg total) by mouth 2 (two) times daily.  Dispense: 60 tablet; Refill: 5 - DULoxetine (CYMBALTA) 60 MG capsule; Take 1 capsule (60 mg total) by mouth daily.  Dispense: 90 capsule; Refill: 1  5. Primary insomnia Bedtime routine - zolpidem (AMBIEN) 5 MG tablet; Take 1 tablet (5 mg total) by mouth at bedtime as needed. for sleep  Dispense: 30 tablet; Refill: 5 - traZODone (DESYREL) 50 MG tablet; Take 1 tablet (50 mg total) by mouth at bedtime.  Dispense: 90 tablet; Refill: 1  6. 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 Severe obesity (BMI >= 40) (HCC) Discussed diet and exercise for person with BMI >25 Will recheck weight in 3-6 months    Labs pending Health Maintenance reviewed Diet and exercise encouraged  Follow up plan: 6 months   Mary-Margaret Daphine Deutscher, FNP

## 2023-07-23 DIAGNOSIS — L820 Inflamed seborrheic keratosis: Secondary | ICD-10-CM | POA: Diagnosis not present

## 2023-07-23 DIAGNOSIS — L814 Other melanin hyperpigmentation: Secondary | ICD-10-CM | POA: Diagnosis not present

## 2023-07-23 DIAGNOSIS — C44519 Basal cell carcinoma of skin of other part of trunk: Secondary | ICD-10-CM | POA: Diagnosis not present

## 2023-07-23 DIAGNOSIS — L853 Xerosis cutis: Secondary | ICD-10-CM | POA: Diagnosis not present

## 2023-07-23 DIAGNOSIS — L821 Other seborrheic keratosis: Secondary | ICD-10-CM | POA: Diagnosis not present

## 2023-07-23 DIAGNOSIS — Z85828 Personal history of other malignant neoplasm of skin: Secondary | ICD-10-CM | POA: Diagnosis not present

## 2023-08-08 ENCOUNTER — Encounter: Payer: Self-pay | Admitting: Family Medicine

## 2023-08-08 ENCOUNTER — Ambulatory Visit (INDEPENDENT_AMBULATORY_CARE_PROVIDER_SITE_OTHER): Payer: Medicare Other | Admitting: Family Medicine

## 2023-08-08 VITALS — BP 123/80 | HR 81 | Temp 98.6°F | Ht 70.0 in | Wt 300.4 lb

## 2023-08-08 DIAGNOSIS — J0140 Acute pansinusitis, unspecified: Secondary | ICD-10-CM | POA: Diagnosis not present

## 2023-08-08 MED ORDER — DOXYCYCLINE HYCLATE 100 MG PO TABS
100.0000 mg | ORAL_TABLET | Freq: Two times a day (BID) | ORAL | 0 refills | Status: AC
Start: 2023-08-08 — End: 2023-08-15

## 2023-08-08 NOTE — Progress Notes (Signed)
Acute Office Visit  Subjective:     Patient ID: Matthew Irwin, male    DOB: 1956/04/26, 67 y.o.   MRN: [id number]  Chief Complaint  Patient presents with   Sinusitis    Sinusitis This is a new problem. Episode onset: 2 weeks. The problem is unchanged. There has been no fever. Associated symptoms include congestion, coughing, ear pain (pressure), headaches, sinus pressure and a sore throat. Pertinent negatives include no chills, diaphoresis, neck pain, shortness of breath, sneezing or swollen glands. Past treatments include oral decongestants. The treatment provided no relief.    Review of Systems  Constitutional:  Negative for chills and diaphoresis.  HENT:  Positive for congestion, ear pain (pressure), sinus pressure and sore throat. Negative for sneezing.   Respiratory:  Positive for cough. Negative for shortness of breath.   Musculoskeletal:  Negative for neck pain.  Neurological:  Positive for headaches.        Objective:    BP 123/80   Pulse 81   Temp 98.6 F (37 C) (Temporal)   Ht 5\' 10"  (1.778 m)   Wt (!) 300 lb 6 oz (136.2 kg)   SpO2 95%   BMI 43.10 kg/m    Physical Exam Vitals and nursing note reviewed.  Constitutional:      General: He is not in acute distress.    Appearance: He is not ill-appearing, toxic-appearing or diaphoretic.  HENT:     Right Ear: Ear canal and external ear normal. A middle ear effusion is present. Tympanic membrane is not perforated, erythematous, retracted or bulging.     Left Ear: Ear canal and external ear normal. A middle ear effusion is present. Tympanic membrane is not perforated, erythematous, retracted or bulging.     Nose:     Right Sinus: Maxillary sinus tenderness and frontal sinus tenderness present.     Left Sinus: Maxillary sinus tenderness and frontal sinus tenderness present.     Mouth/Throat:     Lips: Pink.     Mouth: Mucous membranes are moist.     Pharynx: Oropharynx is clear. Posterior oropharyngeal erythema  present. No pharyngeal swelling or uvula swelling.     Tonsils: No tonsillar exudate or tonsillar abscesses. 1+ on the right. 1+ on the left.  Cardiovascular:     Rate and Rhythm: Normal rate and regular rhythm.     Heart sounds: Normal heart sounds. No murmur heard. Pulmonary:     Effort: Pulmonary effort is normal. No respiratory distress.     Breath sounds: Normal breath sounds. No wheezing, rhonchi or rales.  Musculoskeletal:     Cervical back: Neck supple.     Right lower leg: No edema.     Left lower leg: No edema.  Lymphadenopathy:     Cervical: No cervical adenopathy.  Skin:    General: Skin is warm and dry.  Neurological:     General: No focal deficit present.     Mental Status: He is alert and oriented to person, place, and time.  Psychiatric:        Mood and Affect: Mood normal.        Behavior: Behavior normal.     No results found for any visits on 08/08/23.      Assessment & Plan:   Stuart was seen today for sinusitis.  Diagnoses and all orders for this visit:  Acute non-recurrent pansinusitis Doxycyline as below. Discussed symptomatic care and return precautions.  -     doxycycline (VIBRA-TABS) 100  MG tablet; Take 1 tablet (100 mg total) by mouth 2 (two) times daily for 7 days.   Return to office for new or worsening symptoms, or if symptoms persist.   The patient indicates understanding of these issues and agrees with the plan.  Gabriel Earing, FNP

## 2023-08-18 ENCOUNTER — Encounter: Payer: Self-pay | Admitting: Nurse Practitioner

## 2023-08-18 ENCOUNTER — Telehealth (INDEPENDENT_AMBULATORY_CARE_PROVIDER_SITE_OTHER): Payer: Medicare Other | Admitting: Nurse Practitioner

## 2023-08-18 DIAGNOSIS — R051 Acute cough: Secondary | ICD-10-CM | POA: Diagnosis not present

## 2023-08-18 MED ORDER — AZITHROMYCIN 250 MG PO TABS
ORAL_TABLET | ORAL | 0 refills | Status: DC
Start: 2023-08-18 — End: 2023-09-02

## 2023-08-18 NOTE — Progress Notes (Signed)
Virtual Visit Consent   Matthew Irwin, you are scheduled for a virtual visit with Mary-Margaret Daphine Deutscher, FNP, a [facility] provider, today.     Just as with appointments in the office, your consent must be obtained to participate.  Your consent will be active for this visit and any virtual visit you may have with one of our providers in the next 365 days.     If you have a MyChart account, a copy of this consent can be sent to you electronically.  All virtual visits are billed to your insurance company just like a traditional visit in the office.    As this is a virtual visit, video technology does not allow for your provider to perform a traditional examination.  This may limit your provider's ability to fully assess your condition.  If your provider identifies any concerns that need to be evaluated in person or the need to arrange testing (such as labs, EKG, etc.), we will make arrangements to do so.     Although advances in technology are sophisticated, we cannot ensure that it will always work on either your end or our end.  If the connection with a video visit is poor, the visit may have to be switched to a telephone visit.  With either a video or telephone visit, we are not always able to ensure that we have a secure connection.     I need to obtain your verbal consent now.   Are you willing to proceed with your visit today? YES   EZEKIEL BORENSTEIN has provided verbal consent on 08/18/2023 for a virtual visit (video or telephone).   Mary-Margaret Daphine Deutscher, FNP   Date: 08/18/2023 1:49 PM   Virtual Visit via Video Note   I, Mary-Margaret Daphine Deutscher, connected with AHMEIR STEPKA ([id number], Oct 28, 1955) on 08/18/23 at  5:00 PM EDT by a video-enabled telemedicine application and verified that I am speaking with the correct person using two identifiers.  Location: Patient: Virtual Visit Location Patient: Home Provider: Virtual Visit Location Provider: Mobile   I discussed the limitations of  evaluation and management by telemedicine and the availability of in person appointments. The patient expressed understanding and agreed to proceed.    History of Present Illness: Matthew Irwin is a 67 y.o. who identifies as a male who was assigned male at birth, and is being seen today for uri.  HPI: Patient was seen last week and was out on antibiotic for URI. Took last antibiotic on Friday. Has been gradually getting worse since. Cough is non productive. Feels like cough is coming from phlegm going down throat.   URI  This is a new problem. The current episode started in the past 7 days. The problem has been waxing and waning. There has been no fever. Associated symptoms include congestion, coughing, rhinorrhea and sinus pain. The treatment provided mild relief.    Review of Systems  HENT:  Positive for congestion, rhinorrhea and sinus pain.   Respiratory:  Positive for cough.     Problems:  Patient Active Problem List   Diagnosis Date Noted   Severe obesity (BMI >= 40) (HCC) 10/19/2014   Hypertension 11/17/2013   GAD (generalized anxiety disorder) 01/10/2011   Insomnia 01/10/2011   Hyperlipidemia with target LDL less than 100 12/21/2009   CONSTRICTIVE PERICARDITIS 09/13/2009   Diffuse large B cell lymphoma (HCC) 08/28/2009   Congestive heart failure (HCC) 08/28/2009    Allergies:  Allergies  Allergen Reactions   Penicillins  Hives    Has patient had a PCN reaction causing immediate rash, facial/tongue/throat swelling, SOB or lightheadedness with hypotension: No Has patient had a PCN reaction causing severe rash involving mucus membranes or skin necrosis: Yes Has patient had a PCN reaction that required hospitalization No Has patient had a PCN reaction occurring within the last 10 years: No If all of the above answers are "NO", then may proceed with Cephalosporin use.    Simvastatin     REACTION: myalgia   Medications:  Current Outpatient Medications:    albuterol (VENTOLIN  HFA) 108 (90 Base) MCG/ACT inhaler, Inhale 2 puffs into the lungs every 6 (six) hours as needed for wheezing or shortness of breath., Disp: 8 g, Rfl: 0   aspirin 81 MG tablet, Take 81 mg by mouth at bedtime., Disp: , Rfl:    atorvastatin (LIPITOR) 40 MG tablet, Take 1 tablet (40 mg total) by mouth daily at 6 PM., Disp: 90 tablet, Rfl: 1   carvedilol (COREG) 3.125 MG tablet, Take 1 tablet (3.125 mg total) by mouth 2 (two) times daily with a meal., Disp: 180 tablet, Rfl: 1   cetirizine (ZYRTEC) 10 MG tablet, Take 10 mg by mouth daily., Disp: , Rfl:    clonazePAM (KLONOPIN) 0.5 MG tablet, Take 1 tablet (0.5 mg total) by mouth 2 (two) times daily., Disp: 60 tablet, Rfl: 5   DULoxetine (CYMBALTA) 60 MG capsule, Take 1 capsule (60 mg total) by mouth daily., Disp: 90 capsule, Rfl: 1   fluticasone (FLONASE) 50 MCG/ACT nasal spray, Place 2 sprays into both nostrils daily., Disp: 16 g, Rfl: 6   losartan (COZAAR) 25 MG tablet, Take 1 tablet (25 mg total) by mouth daily., Disp: 90 tablet, Rfl: 1   Multiple Vitamins-Minerals (CENTRUM SILVER ULTRA MENS) TABS, Take 1 tablet by mouth daily., Disp: , Rfl:    Olopatadine HCl (PATADAY OP), Apply to eye., Disp: , Rfl:    Omega-3 Fatty Acids (FISH OIL) 1000 MG CAPS, Take 1,000 mg by mouth 2 (two) times daily., Disp: , Rfl:    Propylene Glycol-Glycerin 0.6-0.6 % SOLN, Apply 1 drop to eye daily as needed (dry eyes)., Disp: , Rfl:    traZODone (DESYREL) 50 MG tablet, Take 1 tablet (50 mg total) by mouth at bedtime., Disp: 90 tablet, Rfl: 1   zolpidem (AMBIEN) 5 MG tablet, Take 1 tablet (5 mg total) by mouth at bedtime as needed. for sleep, Disp: 30 tablet, Rfl: 5  Observations/Objective: Patient is well-developed, well-nourished in no acute distress.  Resting comfortably  at home.  Head is normocephalic, atraumatic.  No labored breathing.  Speech is clear and coherent with logical content.  Patient is alert and oriented at baseline.  Raspy voice Deep dry  cough  Assessment and Plan:  Matthew Irwin in today with chief complaint of URI   1. Acute cough 1. Take meds as prescribed 2. Use a cool mist humidifier especially during the winter months and when heat has been humid. 3. Use saline nose sprays frequently 4. Saline irrigations of the nose can be very helpful if done frequently.  * 4X daily for 1 week*  * Use of a nettie pot can be helpful with this. Follow directions with this* 5. Drink plenty of fluids 6. Keep thermostat turn down low 7.For any cough or congestion- mucinex DM 8. For fever or aces or pains- take tylenol or ibuprofen appropriate for age and weight.  * for fevers greater than 101 orally you may alternate ibuprofen  and tylenol every  3 hours.   Remain on flonase - azithromycin (ZITHROMAX Z-PAK) 250 MG tablet; As directed  Dispense: 6 tablet; Refill: 0     Follow Up Instructions: I discussed the assessment and treatment plan with the patient. The patient was provided an opportunity to ask questions and all were answered. The patient agreed with the plan and demonstrated an understanding of the instructions.  A copy of instructions were sent to the patient via MyChart.  The patient was advised to call back or seek an in-person evaluation if the symptoms worsen or if the condition fails to improve as anticipated.  Time:  I spent 6 minutes with the patient via telehealth technology discussing the above problems/concerns.    Mary-Margaret Daphine Deutscher, FNP

## 2023-08-18 NOTE — Patient Instructions (Signed)
1. Take meds as prescribed 2. Use a cool mist humidifier especially during the winter months and when heat has been humid. 3. Use saline nose sprays frequently 4. Saline irrigations of the nose can be very helpful if done frequently.  * 4X daily for 1 week*  * Use of a nettie pot can be helpful with this. Follow directions with this* 5. Drink plenty of fluids 6. Keep thermostat turn down low 7.For any cough or congestion- OTC meds 8. For fever or aces or pains- take tylenol or ibuprofen appropriate for age and weight.  * for fevers greater than 101 orally you may alternate ibuprofen and tylenol every  3 hours.

## 2023-08-21 ENCOUNTER — Telehealth: Payer: Self-pay | Admitting: Nurse Practitioner

## 2023-08-21 NOTE — Telephone Encounter (Signed)
Please review and advise.

## 2023-08-21 NOTE — Telephone Encounter (Signed)
  Incoming Patient Call  08/21/2023  What symptoms do you have? Stopped up head and coughing  How long have you been sick? About three weeks   Have you been seen for this problem? Pt had video visit on 10/28 and seen on 08/08/23, pt will finish taking antibiotic tomorrow he is better but he is not well   If your provider decides to give you a prescription, which pharmacy would you like for it to be sent to? [facility]   Patient informed that this information will be sent to the clinical staff for review and that they should receive a follow up call.

## 2023-08-21 NOTE — Telephone Encounter (Signed)
Just need to take mucinex d otc and use flonase nasal spray

## 2023-08-21 NOTE — Telephone Encounter (Signed)
Patient notified and verbalized understanding. 

## 2023-09-02 ENCOUNTER — Ambulatory Visit (INDEPENDENT_AMBULATORY_CARE_PROVIDER_SITE_OTHER): Payer: Medicare Other | Admitting: Nurse Practitioner

## 2023-09-02 ENCOUNTER — Encounter: Payer: Self-pay | Admitting: Nurse Practitioner

## 2023-09-02 VITALS — BP 128/76 | HR 70 | Temp 97.3°F | Resp 20 | Ht 70.0 in | Wt 298.0 lb

## 2023-09-02 DIAGNOSIS — E119 Type 2 diabetes mellitus without complications: Secondary | ICD-10-CM | POA: Diagnosis not present

## 2023-09-02 DIAGNOSIS — R739 Hyperglycemia, unspecified: Secondary | ICD-10-CM

## 2023-09-02 DIAGNOSIS — E785 Hyperlipidemia, unspecified: Secondary | ICD-10-CM

## 2023-09-02 DIAGNOSIS — C83398 Diffuse large b-cell lymphoma of other extranodal and solid organ sites: Secondary | ICD-10-CM

## 2023-09-02 DIAGNOSIS — F411 Generalized anxiety disorder: Secondary | ICD-10-CM

## 2023-09-02 DIAGNOSIS — I10 Essential (primary) hypertension: Secondary | ICD-10-CM | POA: Diagnosis not present

## 2023-09-02 DIAGNOSIS — I509 Heart failure, unspecified: Secondary | ICD-10-CM | POA: Diagnosis not present

## 2023-09-02 DIAGNOSIS — I110 Hypertensive heart disease with heart failure: Secondary | ICD-10-CM | POA: Diagnosis not present

## 2023-09-02 DIAGNOSIS — F5101 Primary insomnia: Secondary | ICD-10-CM

## 2023-09-02 LAB — BAYER DCA HB A1C WAIVED: HB A1C (BAYER DCA - WAIVED): 6 % — ABNORMAL HIGH (ref 4.8–5.6)

## 2023-09-02 MED ORDER — CLONAZEPAM 0.5 MG PO TABS: 0.5000 mg | ORAL_TABLET | Freq: Two times a day (BID) | ORAL | 5 refills | Status: DC

## 2023-09-02 MED ORDER — DULOXETINE HCL 60 MG PO CPEP: 60.0000 mg | ORAL_CAPSULE | Freq: Every day | ORAL | 1 refills | Status: DC

## 2023-09-02 MED ORDER — CARVEDILOL 3.125 MG PO TABS: 3.1250 mg | ORAL_TABLET | Freq: Two times a day (BID) | ORAL | 1 refills | Status: DC

## 2023-09-02 MED ORDER — ATORVASTATIN CALCIUM 40 MG PO TABS: 40.0000 mg | ORAL_TABLET | Freq: Every day | ORAL | 1 refills | Status: DC

## 2023-09-02 MED ORDER — LOSARTAN POTASSIUM 25 MG PO TABS: 25.0000 mg | ORAL_TABLET | Freq: Every day | ORAL | 1 refills | Status: DC

## 2023-09-02 MED ORDER — ZOLPIDEM TARTRATE 5 MG PO TABS: 5.0000 mg | ORAL_TABLET | Freq: Every evening | ORAL | 5 refills | Status: DC | PRN

## 2023-09-02 MED ORDER — TRAZODONE HCL 50 MG PO TABS: 50.0000 mg | ORAL_TABLET | Freq: Every day | ORAL | 1 refills | Status: DC

## 2023-09-02 NOTE — Patient Instructions (Signed)
Insomnia Insomnia is a sleep disorder that makes it difficult to fall asleep or stay asleep. Insomnia can cause fatigue, low energy, difficulty concentrating, mood swings, and poor performance at work or school. There are three different ways to classify insomnia: Difficulty falling asleep. Difficulty staying asleep. Waking up too early in the morning. Any type of insomnia can be long-term (chronic) or short-term (acute). Both are common. Short-term insomnia usually lasts for 3 months or less. Chronic insomnia occurs at least three times a week for longer than 3 months. What are the causes? Insomnia may be caused by another condition, situation, or substance, such as: Having certain mental health conditions, such as anxiety and depression. Using caffeine, alcohol, tobacco, or drugs. Having gastrointestinal conditions, such as gastroesophageal reflux disease (GERD). Having certain medical conditions. These include: Asthma. Alzheimer's disease. Stroke. Chronic pain. An overactive thyroid gland (hyperthyroidism). Other sleep disorders, such as restless legs syndrome and sleep apnea. Menopause. Sometimes, the cause of insomnia may not be known. What increases the risk? Risk factors for insomnia include: Gender. Females are affected more often than males. Age. Insomnia is more common as people get older. Stress and certain medical and mental health conditions. Lack of exercise. Having an irregular work schedule. This may include working night shifts and traveling between different time zones. What are the signs or symptoms? If you have insomnia, the main symptom is having trouble falling asleep or having trouble staying asleep. This may lead to other symptoms, such as: Feeling tired or having low energy. Feeling nervous about going to sleep. Not feeling rested in the morning. Having trouble concentrating. Feeling irritable, anxious, or depressed. How is this diagnosed? This condition  may be diagnosed based on: Your symptoms and medical history. Your health care provider may ask about: Your sleep habits. Any medical conditions you have. Your mental health. A physical exam. How is this treated? Treatment for insomnia depends on the cause. Treatment may focus on treating an underlying condition that is causing the insomnia. Treatment may also include: Medicines to help you sleep. Counseling or therapy. Lifestyle adjustments to help you sleep better. Follow these instructions at home: Eating and drinking  Limit or avoid alcohol, caffeinated beverages, and products that contain nicotine and tobacco, especially close to bedtime. These can disrupt your sleep. Do not eat a large meal or eat spicy foods right before bedtime. This can lead to digestive discomfort that can make it hard for you to sleep. Sleep habits  Keep a sleep diary to help you and your health care provider figure out what could be causing your insomnia. Write down: When you sleep. When you wake up during the night. How well you sleep and how rested you feel the next day. Any side effects of medicines you are taking. What you eat and drink. Make your bedroom a dark, comfortable place where it is easy to fall asleep. Put up shades or blackout curtains to block light from outside. Use a white noise machine to block noise. Keep the temperature cool. Limit screen use before bedtime. This includes: Not watching TV. Not using your smartphone, tablet, or computer. Stick to a routine that includes going to bed and waking up at the same times every day and night. This can help you fall asleep faster. Consider making a quiet activity, such as reading, part of your nighttime routine. Try to avoid taking naps during the day so that you sleep better at night. Get out of bed if you are still awake after   15 minutes of trying to sleep. Keep the lights down, but try reading or doing a quiet activity. When you feel  sleepy, go back to bed. General instructions Take over-the-counter and prescription medicines only as told by your health care provider. Exercise regularly as told by your health care provider. However, avoid exercising in the hours right before bedtime. Use relaxation techniques to manage stress. Ask your health care provider to suggest some techniques that may work well for you. These may include: Breathing exercises. Routines to release muscle tension. Visualizing peaceful scenes. Make sure that you drive carefully. Do not drive if you feel very sleepy. Keep all follow-up visits. This is important. Contact a health care provider if: You are tired throughout the day. You have trouble in your daily routine due to sleepiness. You continue to have sleep problems, or your sleep problems get worse. Get help right away if: You have thoughts about hurting yourself or someone else. Get help right away if you feel like you may hurt yourself or others, or have thoughts about taking your own life. Go to your nearest emergency room or: Call 911. Call the National Suicide Prevention Lifeline at 1-800-273-8255 or 988. This is open 24 hours a day. Text the Crisis Text Line at 741741. Summary Insomnia is a sleep disorder that makes it difficult to fall asleep or stay asleep. Insomnia can be long-term (chronic) or short-term (acute). Treatment for insomnia depends on the cause. Treatment may focus on treating an underlying condition that is causing the insomnia. Keep a sleep diary to help you and your health care provider figure out what could be causing your insomnia. This information is not intended to replace advice given to you by your health care provider. Make sure you discuss any questions you have with your health care provider. Document Revised: 09/17/2021 Document Reviewed: 09/17/2021 Elsevier Patient Education  2024 Elsevier Inc.  

## 2023-09-02 NOTE — Progress Notes (Signed)
Subjective:    Patient ID: Matthew Irwin, male    DOB: 13-Mar-1956, 67 y.o.   MRN: [id number]   Chief Complaint: medical management of chronic issues     HPI:  Matthew Irwin is a 67 y.o. who identifies as a male who was assigned male at birth.   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 Primary hypertension No c/o chest pain, sob or headache. Doe snot check blood pressure at home. BP Readings from Last 3 Encounters:  08/08/23 123/80  03/03/23 120/78  01/15/23 (!) 90/56     2. Hyperlipidemia with target LDL less than 100 Does not really wtahc diet and does no dedicated exercise. Lab Results  Component Value Date   CHOL 135 03/03/2023   HDL 38 (L) 03/03/2023   LDLCALC 71 03/03/2023   TRIG 152 (H) 03/03/2023   CHOLHDL 3.6 03/03/2023     3. Chronic congestive heart failure, unspecified heart failure type (HCC) No sob. Has not seen cardiology in awhile  4. 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 Is on klonopin BID    09/02/2023    9:27 AM 08/08/2023    3:15 PM 01/15/2023    8:31 AM 12/10/2022    8:05 AM  GAD 7 : Generalized Anxiety Score  Nervous, Anxious, on Edge 0 0 0 0  Control/stop worrying 0 0 0 0  Worry too much - different things 0 0 0 0  Trouble relaxing 0 0 0 0  Restless 0 0 0 0  Easily annoyed or irritable 0 0 0 0  Afraid - awful might happen 0 0 0 0  Total GAD 7 Score 0 0 0 0  Anxiety Difficulty Not difficult at all Not difficult at all Not difficult at all Not difficult at all      5. Primary insomnia I son Remus Loffler to sleep and is not able to sleep without meds  6. 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 His last oncology visit was in 2019.  7. Severe obesity (BMI >= 40) (HCC) No recent weight changes Wt Readings from Last 3 Encounters:   09/02/23 298 lb (135.2 kg)  08/08/23 (!) 300 lb 6 oz (136.2 kg)  03/03/23 289 lb (131.1 kg)   BMI Readings from Last 3 Encounters:  09/02/23 42.76 kg/m  08/08/23 43.10 kg/m  03/03/23 41.47 kg/m      New complaints: None today  Allergies  Allergen Reactions   Penicillins Hives    Has patient had a PCN reaction causing immediate rash, facial/tongue/throat swelling, SOB or lightheadedness with hypotension: No Has patient had a PCN reaction causing severe rash involving mucus membranes or skin necrosis: Yes Has patient had a PCN reaction that required hospitalization No Has patient had a PCN reaction occurring within the last 10 years: No If all of the above answers are "NO", then may proceed with Cephalosporin use.    Simvastatin     REACTION: myalgia   Outpatient Encounter Medications as of 09/02/2023  Medication Sig   albuterol (VENTOLIN HFA) 108 (90 Base) MCG/ACT inhaler Inhale 2 puffs into the lungs every 6 (six) hours as needed for wheezing or shortness of breath.   aspirin 81 MG tablet Take 81 mg by mouth at bedtime.   atorvastatin (LIPITOR) 40 MG tablet Take 1 tablet (  40 mg total) by mouth daily at 6 PM.   azithromycin (ZITHROMAX Z-PAK) 250 MG tablet As directed   carvedilol (COREG) 3.125 MG tablet Take 1 tablet (3.125 mg total) by mouth 2 (two) times daily with a meal.   cetirizine (ZYRTEC) 10 MG tablet Take 10 mg by mouth daily.   clonazePAM (KLONOPIN) 0.5 MG tablet Take 1 tablet (0.5 mg total) by mouth 2 (two) times daily.   DULoxetine (CYMBALTA) 60 MG capsule Take 1 capsule (60 mg total) by mouth daily.   fluticasone (FLONASE) 50 MCG/ACT nasal spray Place 2 sprays into both nostrils daily.   losartan (COZAAR) 25 MG tablet Take 1 tablet (25 mg total) by mouth daily.   Multiple Vitamins-Minerals (CENTRUM SILVER ULTRA MENS) TABS Take 1 tablet by mouth daily.   Olopatadine HCl (PATADAY OP) Apply to eye.   Omega-3 Fatty Acids (FISH OIL) 1000 MG CAPS Take 1,000 mg by  mouth 2 (two) times daily.   Propylene Glycol-Glycerin 0.6-0.6 % SOLN Apply 1 drop to eye daily as needed (dry eyes).   traZODone (DESYREL) 50 MG tablet Take 1 tablet (50 mg total) by mouth at bedtime.   zolpidem (AMBIEN) 5 MG tablet Take 1 tablet (5 mg total) by mouth at bedtime as needed. for sleep   No facility-administered encounter medications on file as of 09/02/2023.    Past Surgical History:  Procedure Laterality Date   BACK SURGERY  07/2008   tumor removed off spine   heart sack removed  07/2009   HEMORRHOID SURGERY     LEFT HEART CATH  10/10   NASAL SINUS SURGERY     PORT-A-CATH REMOVAL Left 12/13/2016   Procedure: MINOR REMOVAL PORT-A-CATH;  Surgeon: Franky Macho, MD;  Location: [facility];  Service: General;  Laterality: Left;   PORTACATH PLACEMENT     TUMOR REMOVAL      Family History  Problem Relation Age of Onset   Parkinson's disease Mother    Turner syndrome Sister        questionable   Colon cancer Neg Hx       Controlled substance contract: n/a     Review of Systems  Constitutional:  Negative for diaphoresis.  Eyes:  Negative for pain.  Respiratory:  Negative for shortness of breath.   Cardiovascular:  Negative for chest pain, palpitations and leg swelling.  Gastrointestinal:  Negative for abdominal pain.  Endocrine: Negative for polydipsia.  Skin:  Negative for rash.  Neurological:  Negative for dizziness, weakness and headaches.  Hematological:  Does not bruise/bleed easily.  All other systems reviewed and are negative.      Objective:   Physical Exam Vitals and nursing note reviewed.  Constitutional:      Appearance: Normal appearance. He is well-developed.  HENT:     Head: Normocephalic.     Nose: Nose normal.     Mouth/Throat:     Mouth: Mucous membranes are moist.     Pharynx: Oropharynx is clear.  Eyes:     Pupils: Pupils are equal, round, and reactive to light.  Neck:     Thyroid: No thyroid mass or thyromegaly.     Vascular: No  carotid bruit or JVD.     Trachea: Phonation normal.  Cardiovascular:     Rate and Rhythm: Normal rate and regular rhythm.  Pulmonary:     Effort: Pulmonary effort is normal. No respiratory distress.     Breath sounds: Normal breath sounds.  Abdominal:     General: Bowel  sounds are normal.     Palpations: Abdomen is soft.     Tenderness: There is no abdominal tenderness.  Musculoskeletal:        General: Normal range of motion.     Cervical back: Normal range of motion and neck supple.     Right lower leg: Edema (1+) present.     Left lower leg: Edema (1+) present.  Lymphadenopathy:     Cervical: No cervical adenopathy.  Skin:    General: Skin is warm and dry.  Neurological:     Mental Status: He is alert and oriented to person, place, and time.  Psychiatric:        Behavior: Behavior normal.        Thought Content: Thought content normal.        Judgment: Judgment normal.     BP 128/76   Pulse 70   Temp (!) 97.3 F (36.3 C) (Temporal)   Resp 20   Ht 5\' 10"  (1.778 m)   Wt 298 lb (135.2 kg)   SpO2 94%   BMI 42.76 kg/m    HGBAc 6.0%     Assessment & Plan:  MAXIM SOSNOSKI comes in today with chief complaint of Medical Management of Chronic Issues   Diagnosis and orders addressed:  1. Primary hypertension Low sodium diet - CBC with Differential/Platelet - CMP14+EGFR - carvedilol (COREG) 3.125 MG tablet; Take 1 tablet (3.125 mg total) by mouth 2 (two) times daily with a meal.  Dispense: 180 tablet; Refill: 1 - losartan (COZAAR) 25 MG tablet; Take 1 tablet (25 mg total) by mouth daily.  Dispense: 90 tablet; Refill: 1  2. Hyperlipidemia with target LDL less than 100 Low fat diet - Lipid panel - atorvastatin (LIPITOR) 40 MG tablet; Take 1 tablet (40 mg total) by mouth daily at 6 PM.  Dispense: 90 tablet; Refill: 1  3. Chronic congestive heart failure, unspecified heart failure type (HCC) Needs to follow up with cardiology  4. GAD (generalized anxiety  disorder) Stress management - clonazePAM (KLONOPIN) 0.5 MG tablet; Take 1 tablet (0.5 mg total) by mouth 2 (two) times daily.  Dispense: 60 tablet; Refill: 5 - DULoxetine (CYMBALTA) 60 MG capsule; Take 1 capsule (60 mg total) by mouth daily.  Dispense: 90 capsule; Refill: 1  5. Primary insomnia Bedtime routine - traZODone (DESYREL) 50 MG tablet; Take 1 tablet (50 mg total) by mouth at bedtime.  Dispense: 90 tablet; Refill: 1 - zolpidem (AMBIEN) 5 MG tablet; Take 1 tablet (5 mg total) by mouth at bedtime as needed. for sleep  Dispense: 30 tablet; Refill: 5  6. 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 Severe obesity (BMI >= 40) (HCC) Discussed diet and exercise for person with BMI >25 Will recheck weight in 3-6 months   8. Elevated blood sugar Watch carb sin diet  9. Diabetes mellitus without complication (HCC) - Bayer DCA Hb A1c Waived - Microalbumin / creatinine urine ratio   Labs pending Health Maintenance reviewed Diet and exercise encouraged  Follow up plan: 6 months   Mary-Margaret Daphine Deutscher, FNP

## 2023-09-03 LAB — CMP14+EGFR
ALT: 48 [IU]/L — ABNORMAL HIGH (ref 0–44)
AST: 44 [IU]/L — ABNORMAL HIGH (ref 0–40)
Albumin: 4 g/dL (ref 3.9–4.9)
Alkaline Phosphatase: 88 [IU]/L (ref 44–121)
BUN/Creatinine Ratio: 14 (ref 10–24)
BUN: 12 mg/dL (ref 8–27)
Bilirubin Total: 0.7 mg/dL (ref 0.0–1.2)
CO2: 29 mmol/L (ref 20–29)
Calcium: 9.3 mg/dL (ref 8.6–10.2)
Chloride: 102 mmol/L (ref 96–106)
Creatinine, Ser: 0.86 mg/dL (ref 0.76–1.27)
Globulin, Total: 2.9 g/dL (ref 1.5–4.5)
Glucose: 145 mg/dL — ABNORMAL HIGH (ref 70–99)
Potassium: 4.3 mmol/L (ref 3.5–5.2)
Sodium: 142 mmol/L (ref 134–144)
Total Protein: 6.9 g/dL (ref 6.0–8.5)
eGFR: 95 mL/min/{1.73_m2} (ref >59)

## 2023-09-03 LAB — CBC WITH DIFFERENTIAL/PLATELET
Basophils Absolute: 0 10*3/uL (ref 0.0–0.2)
Basos: 1 %
EOS (ABSOLUTE): 0.3 10*3/uL (ref 0.0–0.4)
Eos: 6 %
Hematocrit: 40.5 % (ref 37.5–51.0)
Hemoglobin: 13.2 g/dL (ref 13.0–17.7)
Immature Grans (Abs): 0 10*3/uL (ref 0.0–0.1)
Immature Granulocytes: 0 %
Lymphocytes Absolute: 0.9 10*3/uL (ref 0.7–3.1)
Lymphs: 19 %
MCH: 31 pg (ref 26.6–33.0)
MCHC: 32.6 g/dL (ref 31.5–35.7)
MCV: 95 fL (ref 79–97)
Monocytes Absolute: 0.4 10*3/uL (ref 0.1–0.9)
Monocytes: 9 %
Neutrophils Absolute: 3.1 10*3/uL (ref 1.4–7.0)
Neutrophils: 65 %
Platelets: 152 10*3/uL (ref 150–450)
RBC: 4.26 x10E6/uL (ref 4.14–5.80)
RDW: 11.7 % (ref 11.6–15.4)
WBC: 4.8 10*3/uL (ref 3.4–10.8)

## 2023-09-03 LAB — LIPID PANEL
Chol/HDL Ratio: 4 ratio (ref 0.0–5.0)
Cholesterol, Total: 137 mg/dL (ref 100–199)
HDL: 34 mg/dL — ABNORMAL LOW (ref >39)
LDL Chol Calc (NIH): 70 mg/dL (ref 0–99)
Triglycerides: 196 mg/dL — ABNORMAL HIGH (ref 0–149)
VLDL Cholesterol Cal: 33 mg/dL (ref 5–40)

## 2023-09-22 DIAGNOSIS — H532 Diplopia: Secondary | ICD-10-CM | POA: Diagnosis not present

## 2023-09-23 ENCOUNTER — Telehealth: Payer: Self-pay

## 2023-09-23 NOTE — Telephone Encounter (Signed)
Called patient to get him scheduled. Only wants to see MMM and wants to be seen this afternoon if possible and if not then on Thursday. Will MMM work him in today or can something be opened for pt on Thursday?

## 2023-09-23 NOTE — Telephone Encounter (Signed)
Called and spoke with patient appt made  

## 2023-09-23 NOTE — Telephone Encounter (Signed)
Copied from CRM [id number]. Topic: Clinical - Request for Lab/Test Order >> Sep 22, 2023  4:10 PM Fuller Mandril wrote: Reason for CRM: Darien Ramus from [facility] called to schedule pt appt for sinus imaging due to possible sinus infection 3-4 weeks. Callback # [phone] May speak with Ana,Melissa or Silvio Pate

## 2023-09-25 ENCOUNTER — Ambulatory Visit (INDEPENDENT_AMBULATORY_CARE_PROVIDER_SITE_OTHER): Payer: Medicare Other

## 2023-09-25 ENCOUNTER — Ambulatory Visit (INDEPENDENT_AMBULATORY_CARE_PROVIDER_SITE_OTHER): Payer: Medicare Other | Admitting: Nurse Practitioner

## 2023-09-25 VITALS — BP 108/78 | HR 76 | Temp 97.2°F | Resp 20 | Ht 70.0 in | Wt 301.0 lb

## 2023-09-25 DIAGNOSIS — Z98811 Dental restoration status: Secondary | ICD-10-CM | POA: Diagnosis not present

## 2023-09-25 DIAGNOSIS — J321 Chronic frontal sinusitis: Secondary | ICD-10-CM

## 2023-09-25 NOTE — Progress Notes (Signed)
   Subjective:    Patient ID: Matthew Irwin, male    DOB: 04/06/1956, 67 y.o.   MRN: [id number]   Chief Complaint: Sinusitis   Sinusitis This is a new problem. The current episode started more than 1 month ago. The problem has been waxing and waning since onset. There has been no fever. Associated symptoms include congestion, coughing and sinus pressure. Pertinent negatives include no chills. Past treatments include nothing. The treatment provided mild relief.    Patient come sin today c/o of sinus issues that have been going on for several months. He saw eye doctor several days ago and they are requesting he have sinus xrays taken.   Patient Active Problem List   Diagnosis Date Noted   Severe obesity (BMI >= 40) (HCC) 10/19/2014   Hypertension 11/17/2013   GAD (generalized anxiety disorder) 01/10/2011   Insomnia 01/10/2011   Hyperlipidemia with target LDL less than 100 12/21/2009   CONSTRICTIVE PERICARDITIS 09/13/2009   Diffuse large B cell lymphoma (HCC) 08/28/2009   Congestive heart failure (HCC) 08/28/2009       Review of Systems  Constitutional:  Negative for chills and fever.  HENT:  Positive for congestion and sinus pressure.   Respiratory:  Positive for cough.        Objective:   Physical Exam Constitutional:      Appearance: Normal appearance.  HENT:     Right Ear: Tympanic membrane normal.     Left Ear: Tympanic membrane normal.     Nose: Congestion and rhinorrhea present.     Mouth/Throat:     Pharynx: No oropharyngeal exudate or posterior oropharyngeal erythema.  Cardiovascular:     Rate and Rhythm: Normal rate and regular rhythm.     Heart sounds: Normal heart sounds.  Pulmonary:     Effort: Pulmonary effort is normal.     Breath sounds: Normal breath sounds.  Skin:    General: Skin is warm.  Neurological:     General: No focal deficit present.     Mental Status: He is alert and oriented to person, place, and time.  Psychiatric:        Mood and  Affect: Mood normal.        Behavior: Behavior normal.     BP 108/78   Pulse 76   Temp (!) 97.2 F (36.2 C) (Skin)   Resp 20   Ht 5\' 10"  (1.778 m)   Wt (!) 301 lb (136.5 kg)   BMI 43.19 kg/m          Assessment & Plan:  Matthew Irwin in today with chief complaint of Sinusitis   1. Chronic frontal sinusitis Continue DM cough meds OTC Force fluids RTO prn Will call with xray results - DG Sinuses Complete    The above assessment and management plan was discussed with the patient. The patient verbalized understanding of and has agreed to the management plan. Patient is aware to call the clinic if symptoms persist or worsen. Patient is aware when to return to the clinic for a follow-up visit. Patient educated on when it is appropriate to go to the emergency department.   Mary-Margaret Daphine Deutscher, FNP

## 2023-09-25 NOTE — Patient Instructions (Signed)

## 2024-01-12 ENCOUNTER — Other Ambulatory Visit: Payer: Self-pay | Admitting: Nurse Practitioner

## 2024-01-13 ENCOUNTER — Ambulatory Visit (INDEPENDENT_AMBULATORY_CARE_PROVIDER_SITE_OTHER): Payer: Medicare Other

## 2024-01-13 VITALS — Ht 70.0 in | Wt 301.0 lb

## 2024-01-13 DIAGNOSIS — Z0000 Encounter for general adult medical examination without abnormal findings: Secondary | ICD-10-CM | POA: Diagnosis not present

## 2024-01-13 NOTE — Patient Instructions (Signed)
 Mr. Perrell , Thank you for taking time to come for your Medicare Wellness Visit. I appreciate your ongoing commitment to your health goals. Please review the following plan we discussed and let me know if I can assist you in the future.   Referrals/Orders/Follow-Ups/Clinician Recommendations: Aim for 30 minutes of exercise or brisk walking, 6-8 glasses of water, and 5 servings of fruits and vegetables each day.  This is a list of the screening recommended for you and due dates:  Health Maintenance  Topic Date Due   Yearly kidney health urinalysis for diabetes  Never done   Zoster (Shingles) Vaccine (1 of 2) Never done   Pneumonia Vaccine (2 of 2 - PPSV23 or PCV20) 09/01/2024*   COVID-19 Vaccine (7 - Moderna risk 2024-25 season) 03/02/2024   Yearly kidney function blood test for diabetes  09/01/2024   Medicare Annual Wellness Visit  01/12/2025   Cologuard (Stool DNA test)  07/10/2025   DTaP/Tdap/Td vaccine (3 - Td or Tdap) 06/13/2031   Flu Shot  Completed   Hepatitis C Screening  Completed   HPV Vaccine  Aged Out  *Topic was postponed. The date shown is not the original due date.    Advanced directives: (ACP Link)Information on Advanced Care Planning can be found at [facility] Advance Health Care Directives Advance Health Care Directives. [email or web address]   Next Medicare Annual Wellness Visit scheduled for next year: Yes

## 2024-01-13 NOTE — Progress Notes (Signed)
 Subjective:   Matthew Irwin is a 68 y.o. who presents for a Medicare Wellness preventive visit.  Visit Complete: Virtual I connected with  Remigio Eisenmenger on 01/13/24 by a audio enabled telemedicine application and verified that I am speaking with the correct person using two identifiers.  Patient Location: Home  Provider Location: Home Office  I discussed the limitations of evaluation and management by telemedicine. The patient expressed understanding and agreed to proceed.  Vital Signs: Because this visit was a virtual/telehealth visit, some criteria may be missing or patient reported. Any vitals not documented were not able to be obtained and vitals that have been documented are patient reported.  VideoDeclined- This patient declined Librarian, academic. Therefore the visit was completed with audio only.  Persons Participating in Visit: Patient.  AWV Questionnaire: No: Patient Medicare AWV questionnaire was not completed prior to this visit.  Cardiac Risk Factors include: advanced age (>64men, >9 women);male gender;hypertension;dyslipidemia     Objective:    Today's Vitals   01/13/24 1443  Weight: (!) 301 lb (136.5 kg)  Height: 5\' 10"  (1.778 m)   Body mass index is 43.19 kg/m.     01/13/2024    2:48 PM 01/09/2023    8:52 AM 11/19/2017   12:07 PM 11/11/2016    1:25 PM 10/17/2016    4:15 PM 08/19/2016    4:30 PM 07/08/2016    3:54 PM  Advanced Directives  Does Patient Have a Medical Advance Directive? No No No No No No No  Would patient like information on creating a medical advance directive? Yes (MAU/Ambulatory/Procedural Areas - Information given) No - Patient declined No - Patient declined   No - patient declined information No - patient declined information    Current Medications (verified) Outpatient Encounter Medications as of 01/13/2024  Medication Sig   albuterol (VENTOLIN HFA) 108 (90 Base) MCG/ACT inhaler Inhale 2 puffs into the lungs  every 6 (six) hours as needed for wheezing or shortness of breath.   aspirin 81 MG tablet Take 81 mg by mouth at bedtime.   atorvastatin (LIPITOR) 40 MG tablet Take 1 tablet (40 mg total) by mouth daily at 6 PM.   carvedilol (COREG) 3.125 MG tablet Take 1 tablet (3.125 mg total) by mouth 2 (two) times daily with a meal.   cetirizine (ZYRTEC) 10 MG tablet TAKE ONE TABLET DAILY   clonazePAM (KLONOPIN) 0.5 MG tablet Take 1 tablet (0.5 mg total) by mouth 2 (two) times daily.   DULoxetine (CYMBALTA) 60 MG capsule Take 1 capsule (60 mg total) by mouth daily.   fluticasone (FLONASE) 50 MCG/ACT nasal spray Place 2 sprays into both nostrils daily.   losartan (COZAAR) 25 MG tablet Take 1 tablet (25 mg total) by mouth daily.   Multiple Vitamins-Minerals (CENTRUM SILVER ULTRA MENS) TABS Take 1 tablet by mouth daily.   Olopatadine HCl (PATADAY OP) Apply to eye.   Omega-3 Fatty Acids (FISH OIL) 1000 MG CAPS Take 1,000 mg by mouth 2 (two) times daily.   Propylene Glycol-Glycerin 0.6-0.6 % SOLN Apply 1 drop to eye daily as needed (dry eyes).   traZODone (DESYREL) 50 MG tablet Take 1 tablet (50 mg total) by mouth at bedtime.   zolpidem (AMBIEN) 5 MG tablet Take 1 tablet (5 mg total) by mouth at bedtime as needed. for sleep   No facility-administered encounter medications on file as of 01/13/2024.    Allergies (verified) Penicillins and Simvastatin   History: Past Medical History:  Diagnosis Date   ACEI/ARB contraindicated    Anxiety    Bronchitis    Cardiomyopathy    Chest pain    left heart catherization   CHF (congestive heart failure) (HCC)    History of cardiac catheterization    HTN (hypertension)    Hyperlipidemia    myalgias with pravastatin and simvastatin.   NHL (non-Hodgkin's lymphoma) (HCC)    history of   Obesity 08/16/2011   Sinus infection    Subacute effusive constrictive pericarditis    Past Surgical History:  Procedure Laterality Date   BACK SURGERY  07/2008   tumor  removed off spine   heart sack removed  07/2009   HEMORRHOID SURGERY     LEFT HEART CATH  10/10   NASAL SINUS SURGERY     PORT-A-CATH REMOVAL Left 12/13/2016   Procedure: MINOR REMOVAL PORT-A-CATH;  Surgeon: Franky Macho, MD;  Location: [facility];  Service: General;  Laterality: Left;   PORTACATH PLACEMENT     TUMOR REMOVAL     Family History  Problem Relation Age of Onset   Parkinson's disease Mother    Turner syndrome Sister        questionable   Colon cancer Neg Hx    Social History   Socioeconomic History   Marital status: Married    Spouse name: Not on file   Number of children: Not on file   Years of education: Not on file   Highest education level: 12th grade  Occupational History   Not on file  Tobacco Use   Smoking status: Former   Smokeless tobacco: Never   Tobacco comments:    Quit tobacco in 1999 after 35 pyr hx  Substance and Sexual Activity   Alcohol use: No   Drug use: No   Sexual activity: Yes  Other Topics Concern   Not on file  Social History Narrative   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  Chemotherapy, including Adriamycin, completed 3/10.  Radiation to chest/back in early 2010.  PET scan 11/10 with no evidence for recurrent lymphoma.       2. Chest pain: Left heart cath (10/10) with no angiographic coronary disease.      3.Cardiomyopathy: Nonischemic.  EF 35-40% with global hypokinesis by LV-gram 10/10.  Cardiac MRI (10/10) was limited by respiratory artifact but showed a small to moderate free-flowing pericardial effusion with no tamponade, EF 43% with global hypokinesis, no myocardial delayed enhancement (no definite evidence of myocardial infarction, infiltrative disease, or myocarditis). Echo (11/10) with EF 40-45%.  Cardiomyopathy may be secondary to Adriamycin toxicity.  Echo after pericardiectomy in (12/10) showed EF 55%.      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  Had free-flowing effusion in 10/10 with no tamponade.  Patient was reimaged by echo in 11/10 showed EF 40-45% (global HK), dilated IVC, and evidence for ventricular interdependence with an organized pericardial effusion.  Hemodynamic right and left heart cath showed mean RA 14, RV 27/18, PA 29/17, mean PCWP 14, LVEDP 18, cardiac index 1.8 => there was equalization of diastolic pressure.  Also, ventricular interdependence was demonstrated by cath.  Patient underwent pericardiectomy in 11/10.  Echo after pericardiectomy (12/10) showed EF 55%, moderate diastolic dysfunction, RV moderately dilated and moderately dysfunctional.  There were still some signs of constrictive physiology (respirophasic interventricular septal variation) but the IVC was small with inspiratory collapse.   Repeat echo 6/11 showed EF 50%, normal  RV size and systolic function (improved from 2024/10/18), some septal shift with inspiration but less pronounced than in 10/18/2024, and normal IVC size.       FH-No premature CAD   Positive for HTN, DM.    Sister with Turner's Syndrome         Social Drivers of Health   Financial Resource Strain: Low Risk  (01/13/2024)   Overall Financial Resource Strain (CARDIA)    Difficulty of Paying Living Expenses: Not hard at all  Food Insecurity: No Food Insecurity (01/13/2024)   Hunger Vital Sign    Worried About Running Out of Food in the Last Year: Never true    Ran Out of Food in the Last Year: Never true  Transportation Needs: No Transportation Needs (01/13/2024)   PRAPARE - Administrator, Civil Service (Medical): No    Lack of Transportation (Non-Medical): No  Physical Activity: Inactive (01/13/2024)   Exercise Vital Sign    Days of Exercise per Week: 0 days    Minutes of Exercise per Session: 0 min  Stress: No Stress Concern Present (01/13/2024)   [employer] of Occupational Health - Occupational Stress Questionnaire    Feeling of Stress : Only a little   Social Connections: Socially Isolated (01/13/2024)   Social Connection and Isolation Panel [NHANES]    Frequency of Communication with Friends and Family: Never    Frequency of Social Gatherings with Friends and Family: Never    Attends Religious Services: Never    Database administrator or Organizations: No    Attends Engineer, structural: Never    Marital Status: Married    Tobacco Counseling Counseling given: Not Answered Tobacco comments: Quit tobacco in 1999 after 35 pyr hx    Clinical Intake:  Pre-visit preparation completed: Yes  Pain : No/denies pain     Diabetes: No  Lab Results  Component Value Date   HGBA1C 6.0 (H) 09/02/2023   HGBA1C 5.7 (H) 03/03/2023   HGBA1C 6.4 (H) 12/10/2022     How often do you need to have someone help you when you read instructions, pamphlets, or other written materials from your doctor or pharmacy?: 1 - Never  Interpreter Needed?: No  Information entered by :: Kandis Fantasia LPN   Activities of Daily Living     01/13/2024    2:45 PM  In your present state of health, do you have any difficulty performing the following activities:  Hearing? 0  Vision? 0  Difficulty concentrating or making decisions? 0  Walking or climbing stairs? 0  Dressing or bathing? 0  Doing errands, shopping? 0  Preparing Food and eating ? N  Using the Toilet? N  In the past six months, have you accidently leaked urine? N  Do you have problems with loss of bowel control? N  Managing your Medications? N  Managing your Finances? N  Housekeeping or managing your Housekeeping? N    Patient Care Team: Bennie Pierini, FNP as PCP - General (Nurse Practitioner) Franky Macho, MD as Consulting Physician (General Surgery)  Indicate any recent Medical Services you may have received from other than [facility] providers in the past year (date may be approximate).     Assessment:   This is a routine wellness examination for  [employer].  Hearing/Vision screen Hearing Screening - Comments:: Denies hearing difficulties   Vision Screening - Comments:: No vision problems; will schedule routine eye exam soon     Goals Addressed   None  Depression Screen     01/13/2024    2:47 PM 09/25/2023    7:58 AM 08/08/2023    3:15 PM 03/03/2023    9:12 AM 01/15/2023    8:31 AM 01/09/2023    8:51 AM 12/10/2022    8:04 AM  PHQ 2/9 Scores  PHQ - 2 Score 0 0 0 0 0 0 1  PHQ- 9 Score  0 0 0 0 0 2    Fall Risk     01/13/2024    2:48 PM 09/25/2023    7:58 AM 09/02/2023    9:23 AM 08/08/2023    3:14 PM 03/03/2023    9:12 AM  Fall Risk   Falls in the past year? 0 0 1 1 1   Number falls in past yr: 0  1 1 0  Injury with Fall? 0  0 0 0  Risk for fall due to : No Fall Risks  History of fall(s) History of fall(s) Impaired balance/gait  Follow up Falls prevention discussed;Education provided;Falls evaluation completed  Education provided Falls evaluation completed Falls evaluation completed    MEDICARE RISK AT HOME:  Medicare Risk at Home Any stairs in or around the home?: No If so, are there any without handrails?: No Home free of loose throw rugs in walkways, pet beds, electrical cords, etc?: Yes Adequate lighting in your home to reduce risk of falls?: Yes Life alert?: No Use of a cane, walker or w/c?: No Grab bars in the bathroom?: Yes Shower chair or bench in shower?: No Elevated toilet seat or a handicapped toilet?: Yes  TIMED UP AND GO:  Was the test performed?  No  Cognitive Function: 6CIT completed        01/13/2024    2:48 PM 01/09/2023    8:53 AM  6CIT Screen  What Year? 0 points 0 points  What month? 0 points 0 points  What time? 0 points 0 points  Count back from 20 0 points 0 points  Months in reverse 0 points 0 points  Repeat phrase 0 points 0 points  Total Score 0 points 0 points    Immunizations Immunization History  Administered Date(s) Administered   Fluad Quad(high Dose 65+) 08/14/2022    Influenza Whole 07/31/2010   Influenza,inj,Quad PF,6+ Mos 11/17/2013, 07/12/2014, 08/02/2015, 08/13/2016, 07/30/2017, 08/20/2018, 09/12/2020   Influenza-Unspecified 07/16/2021, 07/15/2023   Moderna Covid-19 Fall Seasonal Vaccine 30yrs & older 09/10/2022, 09/03/2023   Moderna Sars-Covid-2 Vaccination 01/04/2020, 02/01/2020, 09/19/2020, 04/10/2021   Pneumococcal Conjugate-13 11/14/2016   Tdap 05/22/2011, 06/12/2021    Screening Tests Health Maintenance  Topic Date Due   Diabetic kidney evaluation - Urine ACR  Never done   Zoster Vaccines- Shingrix (1 of 2) Never done   Pneumonia Vaccine 21+ Years old (2 of 2 - PPSV23 or PCV20) 09/01/2024 (Originally 01/09/2017)   COVID-19 Vaccine (7 - Moderna risk 2024-25 season) 03/02/2024   Diabetic kidney evaluation - eGFR measurement  09/01/2024   Medicare Annual Wellness (AWV)  01/12/2025   Fecal DNA (Cologuard)  07/10/2025   DTaP/Tdap/Td (3 - Td or Tdap) 06/13/2031   INFLUENZA VACCINE  Completed   Hepatitis C Screening  Completed   HPV VACCINES  Aged Out    Health Maintenance  Health Maintenance Due  Topic Date Due   Diabetic kidney evaluation - Urine ACR  Never done   Zoster Vaccines- Shingrix (1 of 2) Never done    Additional Screening:  Vision Screening: Recommended annual ophthalmology exams for early detection of glaucoma and other  disorders of the eye.  Dental Screening: Recommended annual dental exams for proper oral hygiene  Community Resource Referral / Chronic Care Management: CRR required this visit?  No   CCM required this visit?  No     Plan:     I have personally reviewed and noted the following in the patient's chart:   Medical and social history Use of alcohol, tobacco or illicit drugs  Current medications and supplements including opioid prescriptions. Patient is not currently taking opioid prescriptions. Functional ability and status Nutritional status Physical activity Advanced directives List of other  physicians Hospitalizations, surgeries, and ER visits in previous 12 months Vitals Screenings to include cognitive, depression, and falls Referrals and appointments  In addition, I have reviewed and discussed with patient certain preventive protocols, quality metrics, and best practice recommendations. A written personalized care plan for preventive services as well as general preventive health recommendations were provided to patient.     Kandis Fantasia [place], [place]   [id number]   After Visit Summary: (MyChart) Due to this being a telephonic visit, the after visit summary with patients personalized plan was offered to patient via MyChart   Notes: Nothing significant to report at this time.

## 2024-03-02 ENCOUNTER — Encounter: Payer: Self-pay | Admitting: Nurse Practitioner

## 2024-03-02 ENCOUNTER — Ambulatory Visit (INDEPENDENT_AMBULATORY_CARE_PROVIDER_SITE_OTHER): Payer: Medicare Other | Admitting: Nurse Practitioner

## 2024-03-02 VITALS — BP 115/81 | HR 80 | Temp 97.7°F | Ht 70.0 in | Wt 298.0 lb

## 2024-03-02 DIAGNOSIS — M25562 Pain in left knee: Secondary | ICD-10-CM

## 2024-03-02 DIAGNOSIS — E119 Type 2 diabetes mellitus without complications: Secondary | ICD-10-CM

## 2024-03-02 DIAGNOSIS — E785 Hyperlipidemia, unspecified: Secondary | ICD-10-CM | POA: Diagnosis not present

## 2024-03-02 DIAGNOSIS — I10 Essential (primary) hypertension: Secondary | ICD-10-CM

## 2024-03-02 DIAGNOSIS — I509 Heart failure, unspecified: Secondary | ICD-10-CM

## 2024-03-02 DIAGNOSIS — F5101 Primary insomnia: Secondary | ICD-10-CM

## 2024-03-02 DIAGNOSIS — I311 Chronic constrictive pericarditis: Secondary | ICD-10-CM | POA: Diagnosis not present

## 2024-03-02 DIAGNOSIS — F411 Generalized anxiety disorder: Secondary | ICD-10-CM | POA: Diagnosis not present

## 2024-03-02 DIAGNOSIS — Z1211 Encounter for screening for malignant neoplasm of colon: Secondary | ICD-10-CM | POA: Diagnosis not present

## 2024-03-02 DIAGNOSIS — G8929 Other chronic pain: Secondary | ICD-10-CM

## 2024-03-02 DIAGNOSIS — Z0000 Encounter for general adult medical examination without abnormal findings: Secondary | ICD-10-CM

## 2024-03-02 DIAGNOSIS — M25561 Pain in right knee: Secondary | ICD-10-CM

## 2024-03-02 DIAGNOSIS — Z23 Encounter for immunization: Secondary | ICD-10-CM

## 2024-03-02 DIAGNOSIS — Z125 Encounter for screening for malignant neoplasm of prostate: Secondary | ICD-10-CM

## 2024-03-02 DIAGNOSIS — C83398 Diffuse large b-cell lymphoma of other extranodal and solid organ sites: Secondary | ICD-10-CM

## 2024-03-02 LAB — LIPID PANEL

## 2024-03-02 LAB — BAYER DCA HB A1C WAIVED: HB A1C (BAYER DCA - WAIVED): 5.6 % (ref 4.8–5.6)

## 2024-03-02 MED ORDER — ATORVASTATIN CALCIUM 40 MG PO TABS: 40.0000 mg | ORAL_TABLET | Freq: Every day | ORAL | 1 refills | Status: DC

## 2024-03-02 MED ORDER — CARVEDILOL 3.125 MG PO TABS: 3.1250 mg | ORAL_TABLET | Freq: Two times a day (BID) | ORAL | 1 refills | Status: DC

## 2024-03-02 MED ORDER — CLONAZEPAM 0.5 MG PO TABS
0.5000 mg | ORAL_TABLET | Freq: Two times a day (BID) | ORAL | 5 refills | Status: DC
Start: 2024-03-02 — End: 2024-08-27

## 2024-03-02 MED ORDER — LOSARTAN POTASSIUM 25 MG PO TABS: 25.0000 mg | ORAL_TABLET | Freq: Every day | ORAL | 1 refills | Status: DC

## 2024-03-02 MED ORDER — TRAZODONE HCL 50 MG PO TABS: 50.0000 mg | ORAL_TABLET | Freq: Every day | ORAL | 1 refills | Status: DC

## 2024-03-02 MED ORDER — DULOXETINE HCL 60 MG PO CPEP: 60.0000 mg | ORAL_CAPSULE | Freq: Every day | ORAL | 1 refills | Status: DC

## 2024-03-02 MED ORDER — ZOLPIDEM TARTRATE 5 MG PO TABS: 5.0000 mg | ORAL_TABLET | Freq: Every evening | ORAL | 5 refills | Status: DC | PRN

## 2024-03-02 NOTE — Progress Notes (Signed)
 Subjective:    Patient ID: Matthew Irwin, male    DOB: [birth year], 68 y.o.   MRN: [id number]   Chief Complaint: annual physical    HPI:  Matthew Irwin is a 68 y.o. who identifies as a male who was assigned male at birth.   Social history: Lives with: wife Work history: Soil scientist   Comes in today for follow up of the following chronic medical issues:  1. Primary hypertension No c/o chest pain, sob or headache. Does not check blood pressure at h ome. BP Readings from Last 3 Encounters:  09/25/23 108/78  09/02/23 128/76  08/08/23 123/80     2. Chronic congestive heart failure, unspecified heart failure type (HCC) Has occasional lower ext edmea but other wise no symptoms.  3. CONSTRICTIVE PERICARDITIS He has not felt the need to see cardiology in several years.  4. Hyperlipidemia with target LDL less than 100 Does not watch diet and does no dedicated exercise. Lab Results  Component Value Date   CHOL 137 09/02/2023   HDL 34 (L) 09/02/2023   LDLCALC 70 09/02/2023   TRIG 196 (H) 09/02/2023   CHOLHDL 4.0 09/02/2023     5. GAD (generalized anxiety disorder) Takes klonooin BID - he has been on this since his cancer dx in 2012.    03/02/2024    9:53 AM 09/25/2023    7:58 AM 09/02/2023    9:27 AM 08/08/2023    3:15 PM  GAD 7 : Generalized Anxiety Score  Nervous, Anxious, on Edge 0 0 0 0  Control/stop worrying 0 0 0 0  Worry too much - different things 0 0 0 0  Trouble relaxing 0 0 0 0  Restless 0 0 0 0  Easily annoyed or irritable 0 0 0 0  Afraid - awful might happen 0 0 0 0  Total GAD 7 Score 0 0 0 0  Anxiety Difficulty Not difficult at all Not difficult at all Not difficult at all Not difficult at all        6. Primary insomnia Is on trazadone and ambien  to sleep. Says he sleeps ok most nights. 6-7 hours.   7. 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 No reoccurrence . Is no longer being  followed by oncology  8. Severe obesity (BMI >= 40) (HCC) No recnet weight changes Wt Readings from Last 3 Encounters:  01/13/24 (!) 301 lb (136.5 kg)  09/25/23 (!) 301 lb (136.5 kg)  09/02/23 298 lb (135.2 kg)   BMI Readings from Last 3 Encounters:  01/13/24 43.19 kg/m  09/25/23 43.19 kg/m  09/02/23 42.76 kg/m    New complaints: None  today   Allergies  Allergen Reactions   Penicillins Hives    Has patient had a PCN reaction causing immediate rash, facial/tongue/throat swelling, SOB or lightheadedness with hypotension: No Has patient had a PCN reaction causing severe rash involving mucus membranes or skin necrosis: Yes Has patient had a PCN reaction that required hospitalization No Has patient had a PCN reaction occurring within the last 10 years: No If all of the above answers are "NO", then may proceed with Cephalosporin use.    Simvastatin     REACTION: myalgia   Outpatient Encounter Medications as of 03/02/2024  Medication Sig   albuterol  (VENTOLIN  HFA) 108 (90 Base) MCG/ACT inhaler Inhale 2 puffs into the lungs every 6 (six) hours as needed for wheezing or shortness of breath.  aspirin 81 MG tablet Take 81 mg by mouth at bedtime.   atorvastatin  (LIPITOR) 40 MG tablet Take 1 tablet (40 mg total) by mouth daily at 6 PM.   carvedilol  (COREG ) 3.125 MG tablet Take 1 tablet (3.125 mg total) by mouth 2 (two) times daily with a meal.   cetirizine  (ZYRTEC ) 10 MG tablet TAKE ONE TABLET DAILY   clonazePAM  (KLONOPIN ) 0.5 MG tablet Take 1 tablet (0.5 mg total) by mouth 2 (two) times daily.   DULoxetine  (CYMBALTA ) 60 MG capsule Take 1 capsule (60 mg total) by mouth daily.   fluticasone  (FLONASE ) 50 MCG/ACT nasal spray Place 2 sprays into both nostrils daily.   losartan  (COZAAR ) 25 MG tablet Take 1 tablet (25 mg total) by mouth daily.   Multiple Vitamins-Minerals (CENTRUM SILVER ULTRA MENS) TABS Take 1 tablet by mouth daily.   Olopatadine HCl (PATADAY OP) Apply to eye.   Omega-3  Fatty Acids (FISH OIL) 1000 MG CAPS Take 1,000 mg by mouth 2 (two) times daily.   Propylene Glycol-Glycerin 0.6-0.6 % SOLN Apply 1 drop to eye daily as needed (dry eyes).   traZODone  (DESYREL ) 50 MG tablet Take 1 tablet (50 mg total) by mouth at bedtime.   zolpidem  (AMBIEN ) 5 MG tablet Take 1 tablet (5 mg total) by mouth at bedtime as needed. for sleep   No facility-administered encounter medications on file as of 03/02/2024.    Past Surgical History:  Procedure Laterality Date   BACK SURGERY  07/2008   tumor removed off spine   heart sack removed  07/2009   HEMORRHOID SURGERY     LEFT HEART CATH  10/10   NASAL SINUS SURGERY     PORT-A-CATH REMOVAL Left 12/13/2016   Procedure: MINOR REMOVAL PORT-A-CATH;  Surgeon: Alanda Allegra, MD;  Location: [facility];  Service: General;  Laterality: Left;   PORTACATH PLACEMENT     TUMOR REMOVAL      Family History  Problem Relation Age of Onset   Parkinson's disease Mother    Turner syndrome Sister        questionable   Colon cancer Neg Hx       Controlled substance contract: 12/10/22 drug screen today     Review of Systems  Constitutional:  Negative for diaphoresis.  Eyes:  Negative for pain.  Respiratory:  Negative for shortness of breath.   Cardiovascular:  Negative for chest pain, palpitations and leg swelling.  Gastrointestinal:  Negative for abdominal pain.  Endocrine: Negative for polydipsia.  Skin:  Negative for rash.  Neurological:  Negative for dizziness, weakness and headaches.  Hematological:  Does not bruise/bleed easily.  All other systems reviewed and are negative.      Objective:   Physical Exam Vitals and nursing note reviewed.  Constitutional:      Appearance: Normal appearance. He is well-developed.  HENT:     Head: Normocephalic.     Nose: Nose normal.     Mouth/Throat:     Mouth: Mucous membranes are moist.     Pharynx: Oropharynx is clear.  Eyes:     Pupils: Pupils are equal, round, and reactive to  light.  Neck:     Thyroid: No thyroid mass or thyromegaly.     Vascular: No carotid bruit or JVD.     Trachea: Phonation normal.  Cardiovascular:     Rate and Rhythm: Normal rate and regular rhythm.  Pulmonary:     Effort: Pulmonary effort is normal. No respiratory distress.     Breath  sounds: Normal breath sounds.  Abdominal:     General: Bowel sounds are normal.     Palpations: Abdomen is soft.     Tenderness: There is no abdominal tenderness.     Hernia: A hernia (soft ventral hernia) is present.  Musculoskeletal:        General: Normal range of motion.     Cervical back: Normal range of motion and neck supple.  Lymphadenopathy:     Cervical: No cervical adenopathy.  Skin:    General: Skin is warm and dry.  Neurological:     Mental Status: He is alert and oriented to person, place, and time.  Psychiatric:        Behavior: Behavior normal.        Thought Content: Thought content normal.        Judgment: Judgment normal.    BP 115/81   Pulse 80   Temp 97.7 F (36.5 C) (Temporal)   Ht 5\' 10"  (1.778 m)   Wt 298 lb (135.2 kg)   SpO2 97%   BMI 42.76 kg/m     HGBA1c 5.8%      Assessment & Plan:  Matthew Irwin comes in today with chief complaint of     Diagnosis and orders addressed:  1. Annual physical exam   2. Primary hypertension Low sodium diet - CBC with Differential/Platelet - CMP14+EGFR - carvedilol  (COREG ) 3.125 MG tablet; Take 1 tablet (3.125 mg total) by mouth 2 (two) times daily with a meal.  Dispense: 180 tablet; Refill: 1 - losartan  (COZAAR ) 25 MG tablet; Take 1 tablet (25 mg total) by mouth daily.  Dispense: 90 tablet; Refill: 1  3. Chronic congestive heart failure, unspecified heart failure type (HCC) Will see cardiolgy if needed  4. CONSTRICTIVE PERICARDITIS  5. Hyperlipidemia with target LDL less than 100 Low fat diet - Lipid panel - atorvastatin  (LIPITOR) 40 MG tablet; Take 1 tablet (40 mg total) by mouth daily at 6 PM.  Dispense: 90  tablet; Refill: 1  6. GAD (generalized anxiety disorder) Stress manaegment - ToxASSURE Select 13 (MW), Urine - clonazePAM  (KLONOPIN ) 0.5 MG tablet; Take 1 tablet (0.5 mg total) by mouth 2 (two) times daily.  Dispense: 60 tablet; Refill: 5 - DULoxetine  (CYMBALTA ) 60 MG capsule; Take 1 capsule (60 mg total) by mouth daily.  Dispense: 90 capsule; Refill: 1  7. Primary insomnia Bedtime routine - traZODone  (DESYREL ) 50 MG tablet; Take 1 tablet (50 mg total) by mouth at bedtime.  Dispense: 90 tablet; Refill: 1 - zolpidem  (AMBIEN ) 5 MG tablet; Take 1 tablet (5 mg total) by mouth at bedtime as needed. for sleep  Dispense: 30 tablet; Refill: 5  8. 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 Severe obesity (BMI >= 40) (HCC) Discussed diet and exercise for person with BMI >25 Will recheck weight in 3-6 months  10. Diabetes mellitus without complication (HCC) New dx Low carb diet- granddaughter is diabetic so he knows what not to eat!  11. Bil knee pain Referral to ortho Wear knee braces when up on feet alot  Labs pending Health Maintenance reviewed Diet and exercise encouraged  Follow up plan: 6 months   Mary-Margaret Gaylyn Keas, FNP

## 2024-03-02 NOTE — Addendum Note (Signed)
 Addended by: Cherylyn Cos on: 03/02/2024 03:54 PM   Modules accepted: Orders

## 2024-03-02 NOTE — Patient Instructions (Signed)
 Recombinant Zoster (Shingles) Vaccine: What You Need to Know Many vaccine information statements are available in Spanish and other languages. See PromoAge.com.br. 1. Why get vaccinated? Recombinant zoster (shingles) vaccine can prevent shingles. Shingles (also called herpes zoster, or just zoster) is a painful skin rash, usually with blisters. In addition to the rash, shingles can cause fever, headache, chills, or upset stomach. Rarely, shingles can lead to complications such as pneumonia, hearing problems, blindness, brain inflammation (encephalitis), or death. The risk of shingles increases with age. The most common complication of shingles is long-term nerve pain called postherpetic neuralgia (PHN). PHN occurs in the areas where the shingles rash was and can last for months or years after the rash goes away. The pain from PHN can be severe and debilitating. The risk of PHN increases with age. An older adult with shingles is more likely to develop PHN and have longer lasting and more severe pain than a younger person. People with weakened immune systems also have a higher risk of getting shingles and complications from the disease. Shingles is caused by varicella-zoster virus, the same virus that causes chickenpox. After you have chickenpox, the virus stays in your body and can cause shingles later in life. Shingles cannot be passed from one person to another, but the virus that causes shingles can spread and cause chickenpox in someone who has never had chickenpox or has never received chickenpox vaccine. 2. Recombinant shingles vaccine Recombinant shingles vaccine provides strong protection against shingles. By preventing shingles, recombinant shingles vaccine also protects against PHN and other complications. Recombinant shingles vaccine is recommended for: Adults 50 years and older Adults 19 years and older who have a weakened immune system because of disease or treatments Shingles vaccine  is given as a two-dose series. For most people, the second dose should be given 2 to 6 months after the first dose. Some people who have or will have a weakened immune system can get the second dose 1 to 2 months after the first dose. Ask your health care provider for guidance. People who have had shingles in the past and people who have received varicella (chickenpox) vaccine are recommended to get recombinant shingles vaccine. The vaccine is also recommended for people who have already gotten another type of shingles vaccine, the live shingles vaccine. There is no live virus in recombinant shingles vaccine. Shingles vaccine may be given at the same time as other vaccines. 3. Talk with your health care provider Tell your vaccination provider if the person getting the vaccine: Has had an allergic reaction after a previous dose of recombinant shingles vaccine, or has any severe, life-threatening allergies Is currently experiencing an episode of shingles Is pregnant In some cases, your health care provider may decide to postpone shingles vaccination until a future visit. People with minor illnesses, such as a cold, may be vaccinated. People who are moderately or severely ill should usually wait until they recover before getting recombinant shingles vaccine. Your health care provider can give you more information. 4. Risks of a vaccine reaction A sore arm with mild or moderate pain is very common after recombinant shingles vaccine. Redness and swelling can also happen at the site of the injection. Tiredness, muscle pain, headache, shivering, fever, stomach pain, and nausea are common after recombinant shingles vaccine. These side effects may temporarily prevent a vaccinated person from doing regular activities. Symptoms usually go away on their own in 2 to 3 days. You should still get the second dose of recombinant shingles vaccine  even if you had one of these reactions after the first  dose. Guillain-Barr syndrome (GBS), a serious nervous system disorder, has been reported very rarely after recombinant zoster vaccine. People sometimes faint after medical procedures, including vaccination. Tell your provider if you feel dizzy or have vision changes or ringing in the ears. As with any medicine, there is a very remote chance of a vaccine causing a severe allergic reaction, other serious injury, or death. 5. What if there is a serious problem? An allergic reaction could occur after the vaccinated person leaves the clinic. If you see signs of a severe allergic reaction (hives, swelling of the face and throat, difficulty breathing, a fast heartbeat, dizziness, or weakness), call 9-1-1 and get the person to the nearest hospital. For other signs that concern you, call your health care provider. Adverse reactions should be reported to the Vaccine Adverse Event Reporting System (VAERS). Your health care provider will usually file this report, or you can do it yourself. Visit the VAERS website at www.vaers.LAgents.no or call (424)557-8947. VAERS is only for reporting reactions, and VAERS staff members do not give medical advice. 6. How can I learn more? Ask your health care provider. Call your local or state health department. Visit the website of the Food and Drug Administration (FDA) for vaccine package inserts and additional information at GoldCloset.com.ee. Contact the Centers for Disease Control and Prevention (CDC): Call 3203662500 (1-800-CDC-INFO) or Visit CDC's website at PicCapture.uy. Source: CDC Vaccine Information Statement Recombinant Zoster Vaccine (11/24/2020) This same material is available at FootballExhibition.com.br for no charge. This information is not intended to replace advice given to you by your health care provider. Make sure you discuss any questions you have with your health care provider. Document Revised: 01/22/2023 Document Reviewed:  10/23/2022 Elsevier Patient Education  2024 [employer].

## 2024-03-03 LAB — CMP14+EGFR
ALT: 55 IU/L — ABNORMAL HIGH (ref 0–44)
AST: 47 IU/L — ABNORMAL HIGH (ref 0–40)
Albumin: 4 g/dL (ref 3.9–4.9)
Alkaline Phosphatase: 92 IU/L (ref 44–121)
BUN/Creatinine Ratio: 16 (ref 10–24)
BUN: 14 mg/dL (ref 8–27)
Bilirubin Total: 0.8 mg/dL (ref 0.0–1.2)
CO2: 28 mmol/L (ref 20–29)
Calcium: 9.5 mg/dL (ref 8.6–10.2)
Chloride: 99 mmol/L (ref 96–106)
Creatinine, Ser: 0.86 mg/dL (ref 0.76–1.27)
Globulin, Total: 3 g/dL (ref 1.5–4.5)
Glucose: 144 mg/dL — ABNORMAL HIGH (ref 70–99)
Potassium: 4.6 mmol/L (ref 3.5–5.2)
Sodium: 139 mmol/L (ref 134–144)
Total Protein: 7 g/dL (ref 6.0–8.5)
eGFR: 95 mL/min/{1.73_m2} (ref >59)

## 2024-03-03 LAB — CBC WITH DIFFERENTIAL/PLATELET
Basophils Absolute: 0 10*3/uL (ref 0.0–0.2)
Basos: 1 %
EOS (ABSOLUTE): 0.3 10*3/uL (ref 0.0–0.4)
Eos: 6 %
Hematocrit: 44.3 % (ref 37.5–51.0)
Hemoglobin: 14.2 g/dL (ref 13.0–17.7)
Immature Grans (Abs): 0 10*3/uL (ref 0.0–0.1)
Immature Granulocytes: 0 %
Lymphocytes Absolute: 1.2 10*3/uL (ref 0.7–3.1)
Lymphs: 23 %
MCH: 30.7 pg (ref 26.6–33.0)
MCHC: 32.1 g/dL (ref 31.5–35.7)
MCV: 96 fL (ref 79–97)
Monocytes Absolute: 0.5 10*3/uL (ref 0.1–0.9)
Monocytes: 9 %
Neutrophils Absolute: 3.3 10*3/uL (ref 1.4–7.0)
Neutrophils: 61 %
Platelets: 185 10*3/uL (ref 150–450)
RBC: 4.62 x10E6/uL (ref 4.14–5.80)
RDW: 11.8 % (ref 11.6–15.4)
WBC: 5.4 10*3/uL (ref 3.4–10.8)

## 2024-03-03 LAB — LIPID PANEL
Cholesterol, Total: 125 mg/dL (ref 100–199)
HDL: 37 mg/dL — ABNORMAL LOW (ref >39)
LDL CALC COMMENT:: 3.4 ratio (ref 0.0–5.0)
LDL Chol Calc (NIH): 56 mg/dL (ref 0–99)
Triglycerides: 193 mg/dL — ABNORMAL HIGH (ref 0–149)
VLDL Cholesterol Cal: 32 mg/dL (ref 5–40)

## 2024-03-03 LAB — MICROALBUMIN / CREATININE URINE RATIO
Creatinine, Urine: 245.6 mg/dL
Microalb/Creat Ratio: 8 mg/g{creat} (ref 0–29)
Microalbumin, Urine: 19.1 ug/mL

## 2024-03-03 LAB — PSA: Prostate Specific Ag, Serum: 0.5 ng/mL (ref 0.0–4.0)

## 2024-03-04 ENCOUNTER — Ambulatory Visit: Payer: Self-pay | Admitting: Nurse Practitioner

## 2024-03-06 LAB — TOXASSURE SELECT 13 (MW), URINE

## 2024-04-07 DIAGNOSIS — M25561 Pain in right knee: Secondary | ICD-10-CM | POA: Diagnosis not present

## 2024-04-07 DIAGNOSIS — M25562 Pain in left knee: Secondary | ICD-10-CM | POA: Diagnosis not present

## 2024-04-15 DIAGNOSIS — H5203 Hypermetropia, bilateral: Secondary | ICD-10-CM | POA: Diagnosis not present

## 2024-04-29 DIAGNOSIS — M25561 Pain in right knee: Secondary | ICD-10-CM | POA: Diagnosis not present

## 2024-04-29 DIAGNOSIS — M25562 Pain in left knee: Secondary | ICD-10-CM | POA: Diagnosis not present

## 2024-05-03 DIAGNOSIS — M25562 Pain in left knee: Secondary | ICD-10-CM | POA: Diagnosis not present

## 2024-05-03 DIAGNOSIS — M25561 Pain in right knee: Secondary | ICD-10-CM | POA: Diagnosis not present

## 2024-05-06 DIAGNOSIS — M25562 Pain in left knee: Secondary | ICD-10-CM | POA: Diagnosis not present

## 2024-05-06 DIAGNOSIS — M25561 Pain in right knee: Secondary | ICD-10-CM | POA: Diagnosis not present

## 2024-05-12 DIAGNOSIS — M25562 Pain in left knee: Secondary | ICD-10-CM | POA: Diagnosis not present

## 2024-05-12 DIAGNOSIS — M25561 Pain in right knee: Secondary | ICD-10-CM | POA: Diagnosis not present

## 2024-05-17 DIAGNOSIS — M25562 Pain in left knee: Secondary | ICD-10-CM | POA: Diagnosis not present

## 2024-05-17 DIAGNOSIS — M25561 Pain in right knee: Secondary | ICD-10-CM | POA: Diagnosis not present

## 2024-05-19 DIAGNOSIS — M25562 Pain in left knee: Secondary | ICD-10-CM | POA: Diagnosis not present

## 2024-05-19 DIAGNOSIS — M25561 Pain in right knee: Secondary | ICD-10-CM | POA: Diagnosis not present

## 2024-05-26 DIAGNOSIS — M25561 Pain in right knee: Secondary | ICD-10-CM | POA: Diagnosis not present

## 2024-05-26 DIAGNOSIS — M25562 Pain in left knee: Secondary | ICD-10-CM | POA: Diagnosis not present

## 2024-05-31 DIAGNOSIS — M25562 Pain in left knee: Secondary | ICD-10-CM | POA: Diagnosis not present

## 2024-05-31 DIAGNOSIS — M25561 Pain in right knee: Secondary | ICD-10-CM | POA: Diagnosis not present

## 2024-06-02 DIAGNOSIS — M25561 Pain in right knee: Secondary | ICD-10-CM | POA: Diagnosis not present

## 2024-06-02 DIAGNOSIS — M25562 Pain in left knee: Secondary | ICD-10-CM | POA: Diagnosis not present

## 2024-06-09 DIAGNOSIS — M25561 Pain in right knee: Secondary | ICD-10-CM | POA: Diagnosis not present

## 2024-06-09 DIAGNOSIS — M25562 Pain in left knee: Secondary | ICD-10-CM | POA: Diagnosis not present

## 2024-07-13 ENCOUNTER — Encounter: Payer: Self-pay | Admitting: Nurse Practitioner

## 2024-07-13 ENCOUNTER — Ambulatory Visit (INDEPENDENT_AMBULATORY_CARE_PROVIDER_SITE_OTHER): Admitting: Nurse Practitioner

## 2024-07-13 VITALS — BP 140/92 | HR 74 | Temp 97.8°F | Ht 70.0 in | Wt 301.0 lb

## 2024-07-13 DIAGNOSIS — M25511 Pain in right shoulder: Secondary | ICD-10-CM

## 2024-07-13 MED ORDER — CYCLOBENZAPRINE HCL 10 MG PO TABS: 10.0000 mg | ORAL_TABLET | Freq: Three times a day (TID) | ORAL | 1 refills | Status: AC | PRN

## 2024-07-13 MED ORDER — PREDNISONE 10 MG (21) PO TBPK: ORAL_TABLET | ORAL | 0 refills | Status: DC

## 2024-07-13 NOTE — Progress Notes (Signed)
 Subjective:    Patient ID: Matthew Irwin, male    DOB: 02/06/1956, 68 y.o.   MRN: [id number]   Chief Complaint: Shoulder Pain (Right shoulder. Hurting for 1 week/)   Shoulder Pain  The pain is present in the right shoulder. This is a new problem. The current episode started in the past 7 days. There has been no history of extremity trauma. The problem occurs intermittently. The problem has been waxing and waning. The quality of the pain is described as aching. The pain is at a severity of 8/10. The pain is severe. Associated symptoms include a limited range of motion. Pertinent negatives include no numbness or tingling. The symptoms are aggravated by activity. He has tried NSAIDS for the symptoms. The treatment provided mild relief.    Patient Active Problem List   Diagnosis Date Noted   Severe obesity (BMI >= 40) (HCC) 10/19/2014   Hypertension 11/17/2013   GAD (generalized anxiety disorder) 01/10/2011   Insomnia 01/10/2011   Hyperlipidemia with target LDL less than 100 12/21/2009   CONSTRICTIVE PERICARDITIS 09/13/2009   Diffuse large B cell lymphoma (HCC) 08/28/2009   Congestive heart failure (HCC) 08/28/2009       Review of Systems  Constitutional:  Negative for diaphoresis.  Eyes:  Negative for pain.  Respiratory:  Negative for shortness of breath.   Cardiovascular:  Negative for chest pain, palpitations and leg swelling.  Gastrointestinal:  Negative for abdominal pain.  Endocrine: Negative for polydipsia.  Skin:  Negative for rash.  Neurological:  Negative for dizziness, tingling, weakness, numbness and headaches.  Hematological:  Does not bruise/bleed easily.  All other systems reviewed and are negative.      Objective:   Physical Exam Constitutional:      Appearance: Normal appearance. He is obese.  Cardiovascular:     Rate and Rhythm: Normal rate and regular rhythm.     Heart sounds: Normal heart sounds.  Pulmonary:     Effort: Pulmonary effort is normal.      Breath sounds: Normal breath sounds.  Skin:    General: Skin is warm.  Neurological:     General: No focal deficit present.     Mental Status: He is alert and oriented to person, place, and time.  Psychiatric:        Mood and Affect: Mood normal.        Behavior: Behavior normal.     BP (!) 140/92   Pulse 74   Temp 97.8 F (36.6 C) (Temporal)   Ht 5' 10 (1.778 m)   Wt (!) 301 lb (136.5 kg)   SpO2 93%   BMI 43.19 kg/m        Assessment & Plan:  Matthew Irwin in today with chief complaint of Shoulder Pain (Right shoulder. Hurting for 1 week/)   1. Acute pain of right shoulder (Primary) Ice bid Rest RTO if no better - predniSONE  (STERAPRED UNI-PAK 21 TAB) 10 MG (21) TBPK tablet; As directed x 6 days  Dispense: 21 tablet; Refill: 0 - cyclobenzaprine  (FLEXERIL ) 10 MG tablet; Take 1 tablet (10 mg total) by mouth 3 (three) times daily as needed for muscle spasms.  Dispense: 30 tablet; Refill: 1    The above assessment and management plan was discussed with the patient. The patient verbalized understanding of and has agreed to the management plan. Patient is aware to call the clinic if symptoms persist or worsen. Patient is aware when to return to the clinic for a follow-up  visit. Patient educated on when it is appropriate to go to the emergency department.   Mary-Margaret Gladis, FNP

## 2024-07-13 NOTE — Patient Instructions (Signed)
Shoulder Pain Many things can cause shoulder pain, including: An injury to the shoulder. Overuse of the shoulder. Arthritis. The source of the pain can be: Inflammation. An injury to the shoulder joint. An injury to a tendon, ligament, or bone. Follow these instructions at home: Pay attention to changes in your symptoms. Let your health care provider know about them. Follow these instructions to relieve your pain. If you have a removable sling: Wear the sling as told by your provider. Remove it only as told by your provider. Check the skin around the sling every day. Tell your provider about any concerns. Loosen the sling if your fingers tingle, become numb, or become cold. Keep the sling clean. If the sling is not waterproof: Do not let it get wet. Remove it to shower or bathe. Move your arm as little as possible, but keep your hand moving to prevent swelling. Managing pain, stiffness, and swelling  If told, put ice on the painful area. If you have a removable sling or immobilizer, remove it as told by your provider. Put ice in a plastic bag. Place a towel between your skin and the bag. Leave the ice on for 20 minutes, 2-3 times a day. If your skin turns bright red, remove the ice right away to prevent skin damage. The risk of damage is higher if you cannot feel pain, heat, or cold. Move your fingers often to reduce stiffness and swelling. Squeeze a soft ball or a foam pad as much as possible. This helps to keep the shoulder from swelling. It also helps to strengthen the arm. General instructions Take over-the-counter and prescription medicines only as told by your provider. Exercise may help with pain management. Perform exercises if told by your provider. You may be referred to a physical therapist to help in your recovery process. Keep all follow-up visits in order to avoid any type of permanent shoulder disability or chronic pain problems. Contact a health care provider  if: Your pain is not relieved with medicines. New pain develops in your arm, hand, or fingers. You loosen your sling and your arm, hand, or fingers remain tingly, numb, swollen, or painful. Get help right away if: Your arm, hand, or fingers turn white or blue. This information is not intended to replace advice given to you by your health care provider. Make sure you discuss any questions you have with your health care provider. Document Revised: 05/10/2022 Document Reviewed: 05/10/2022 Elsevier Patient Education  2024 Elsevier Inc.  

## 2024-08-04 ENCOUNTER — Other Ambulatory Visit: Payer: Self-pay | Admitting: Nurse Practitioner

## 2024-08-13 ENCOUNTER — Ambulatory Visit: Admitting: Nurse Practitioner

## 2024-08-13 ENCOUNTER — Encounter: Payer: Self-pay | Admitting: Nurse Practitioner

## 2024-08-13 ENCOUNTER — Ambulatory Visit (INDEPENDENT_AMBULATORY_CARE_PROVIDER_SITE_OTHER)

## 2024-08-13 VITALS — BP 113/77 | HR 108 | Temp 97.7°F | Ht 70.0 in | Wt 301.0 lb

## 2024-08-13 DIAGNOSIS — R059 Cough, unspecified: Secondary | ICD-10-CM | POA: Diagnosis not present

## 2024-08-13 DIAGNOSIS — R051 Acute cough: Secondary | ICD-10-CM

## 2024-08-13 DIAGNOSIS — R0989 Other specified symptoms and signs involving the circulatory and respiratory systems: Secondary | ICD-10-CM | POA: Diagnosis not present

## 2024-08-13 DIAGNOSIS — J984 Other disorders of lung: Secondary | ICD-10-CM | POA: Diagnosis not present

## 2024-08-13 MED ORDER — AZITHROMYCIN 250 MG PO TABS: ORAL_TABLET | ORAL | 0 refills | Status: DC

## 2024-08-13 MED ORDER — PREDNISONE 20 MG PO TABS: 40.0000 mg | ORAL_TABLET | Freq: Every day | ORAL | 0 refills | Status: AC

## 2024-08-13 MED ORDER — BENZONATATE 100 MG PO CAPS: 100.0000 mg | ORAL_CAPSULE | Freq: Three times a day (TID) | ORAL | 0 refills | Status: DC | PRN

## 2024-08-13 NOTE — Patient Instructions (Signed)

## 2024-08-13 NOTE — Progress Notes (Signed)
   Subjective:    Patient ID: Matthew Irwin, male    DOB: [birth year], 68 y.o.   MRN: [id number]   Chief Complaint: Cough   Cough Associated symptoms include shortness of breath (when  bending over). Pertinent negatives include no chills or fever.    Patient come sin c/o cough and felling tired. Has been going o for about 2 weeks. Had low grade fever to start with. Cough is intermittently all day. Worse in mornings and at night. Cough mainly when sitting. Cough is nonproductive.  Patient Active Problem List   Diagnosis Date Noted   Severe obesity (BMI >= 40) (HCC) 10/19/2014   Hypertension 11/17/2013   GAD (generalized anxiety disorder) 01/10/2011   Insomnia 01/10/2011   Hyperlipidemia with target LDL less than 100 12/21/2009   CONSTRICTIVE PERICARDITIS 09/13/2009   Diffuse large B cell lymphoma (HCC) 08/28/2009   Congestive heart failure (HCC) 08/28/2009       Review of Systems  Constitutional:  Positive for fatigue. Negative for chills and fever.  Respiratory:  Positive for cough and shortness of breath (when  bending over).   Gastrointestinal: Negative.   Psychiatric/Behavioral: Negative.         Objective:   Physical Exam Constitutional:      Appearance: Normal appearance. He is obese.  Cardiovascular:     Rate and Rhythm: Normal rate and regular rhythm.  Pulmonary:     Effort: Pulmonary effort is normal.     Breath sounds: Normal breath sounds.  Skin:    General: Skin is warm.  Neurological:     General: No focal deficit present.     Mental Status: He is alert and oriented to person, place, and time.  Psychiatric:        Mood and Affect: Mood normal.        Behavior: Behavior normal.     BP 113/77   Pulse (!) 108   Temp 97.7 F (36.5 C) (Temporal)   Ht 5' 10 (1.778 m)   Wt (!) 301 lb (136.5 kg)   SpO2 90%   BMI 43.19 kg/m   Chest xray- unchanged from previous-Preliminary reading by Matthew Lunger, FNP  [facility]      Assessment & Plan:  Matthew Irwin in  today with chief complaint of Cough   1. Acute cough (Primary) Force fluids RTO prn - DG Chest 2 View - azithromycin  (ZITHROMAX  Z-PAK) 250 MG tablet; As directed  Dispense: 6 tablet; Refill: 0 - benzonatate  (TESSALON  PERLES) 100 MG capsule; Take 1 capsule (100 mg total) by mouth 3 (three) times daily as needed for cough.  Dispense: 20 capsule; Refill: 0 - predniSONE  (DELTASONE ) 20 MG tablet; Take 2 tablets (40 mg total) by mouth daily with breakfast for 5 days. 2 po daily for 5 days  Dispense: 10 tablet; Refill: 0    The above assessment and management plan was discussed with the patient. The patient verbalized understanding of and has agreed to the management plan. Patient is aware to call the clinic if symptoms persist or worsen. Patient is aware when to return to the clinic for a follow-up visit. Patient educated on when it is appropriate to go to the emergency department.   [name], FNP

## 2024-08-16 ENCOUNTER — Ambulatory Visit: Payer: Self-pay | Admitting: Nurse Practitioner

## 2024-08-27 ENCOUNTER — Ambulatory Visit: Payer: Self-pay | Admitting: Nurse Practitioner

## 2024-08-27 ENCOUNTER — Encounter: Payer: Self-pay | Admitting: Nurse Practitioner

## 2024-08-27 VITALS — BP 134/88 | HR 66 | Temp 97.5°F | Ht 70.0 in | Wt 300.0 lb

## 2024-08-27 DIAGNOSIS — I509 Heart failure, unspecified: Secondary | ICD-10-CM

## 2024-08-27 DIAGNOSIS — E785 Hyperlipidemia, unspecified: Secondary | ICD-10-CM | POA: Diagnosis not present

## 2024-08-27 DIAGNOSIS — F411 Generalized anxiety disorder: Secondary | ICD-10-CM

## 2024-08-27 DIAGNOSIS — E1169 Type 2 diabetes mellitus with other specified complication: Secondary | ICD-10-CM

## 2024-08-27 DIAGNOSIS — I10 Essential (primary) hypertension: Secondary | ICD-10-CM | POA: Diagnosis not present

## 2024-08-27 DIAGNOSIS — Z8572 Personal history of non-Hodgkin lymphomas: Secondary | ICD-10-CM

## 2024-08-27 DIAGNOSIS — C83398 Diffuse large b-cell lymphoma of other extranodal and solid organ sites: Secondary | ICD-10-CM

## 2024-08-27 DIAGNOSIS — E119 Type 2 diabetes mellitus without complications: Secondary | ICD-10-CM | POA: Diagnosis not present

## 2024-08-27 DIAGNOSIS — F5101 Primary insomnia: Secondary | ICD-10-CM

## 2024-08-27 DIAGNOSIS — E1159 Type 2 diabetes mellitus with other circulatory complications: Secondary | ICD-10-CM | POA: Diagnosis not present

## 2024-08-27 DIAGNOSIS — I152 Hypertension secondary to endocrine disorders: Secondary | ICD-10-CM

## 2024-08-27 LAB — BAYER DCA HB A1C WAIVED: HB A1C (BAYER DCA - WAIVED): 6 % — ABNORMAL HIGH (ref 4.8–5.6)

## 2024-08-27 LAB — LIPID PANEL

## 2024-08-27 MED ORDER — TRAZODONE HCL 50 MG PO TABS: 50.0000 mg | ORAL_TABLET | Freq: Every day | ORAL | 1 refills | Status: AC

## 2024-08-27 MED ORDER — DULOXETINE HCL 60 MG PO CPEP: 60.0000 mg | ORAL_CAPSULE | Freq: Every day | ORAL | 1 refills | Status: AC

## 2024-08-27 MED ORDER — LOSARTAN POTASSIUM 25 MG PO TABS: 25.0000 mg | ORAL_TABLET | Freq: Every day | ORAL | 1 refills | Status: AC

## 2024-08-27 MED ORDER — CARVEDILOL 3.125 MG PO TABS: 3.1250 mg | ORAL_TABLET | Freq: Two times a day (BID) | ORAL | 1 refills | Status: AC

## 2024-08-27 MED ORDER — ZOLPIDEM TARTRATE 10 MG PO TABS: 10.0000 mg | ORAL_TABLET | Freq: Every evening | ORAL | 5 refills | Status: AC | PRN

## 2024-08-27 MED ORDER — CLONAZEPAM 0.5 MG PO TABS: 0.5000 mg | ORAL_TABLET | Freq: Two times a day (BID) | ORAL | 5 refills | Status: AC

## 2024-08-27 MED ORDER — ATORVASTATIN CALCIUM 40 MG PO TABS: 40.0000 mg | ORAL_TABLET | Freq: Every day | ORAL | 1 refills | Status: AC

## 2024-08-27 NOTE — Progress Notes (Addendum)
 Subjective:    Patient ID: Matthew Irwin, male    DOB: [birth year], 68 y.o.   MRN: [id number]   Chief Complaint: medical management of chronic issues     HPI:  Matthew Irwin is a 68 y.o. who identifies as a male who was assigned male at birth.   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 Primary hypertension No c/o chest pain, sob or headache. Doe snot check blood pressure at home. BP Readings from Last 3 Encounters:  08/13/24 113/77  07/13/24 (!) 140/92  03/02/24 115/81     2. Hyperlipidemia with target LDL less than 100 Does not really wtahc diet and does no dedicated exercise. Lab Results  Component Value Date   CHOL 125 03/02/2024   HDL 37 (L) 03/02/2024   LDLCALC 56 03/02/2024   TRIG 193 (H) 03/02/2024   CHOLHDL 3.4 03/02/2024     3. Chronic congestive heart failure, unspecified heart failure type (HCC) No sob. Has not seen cardiology in awhile  4. 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 Is on klonopin  BID    08/27/2024   10:23 AM 03/02/2024    9:53 AM 09/25/2023    7:58 AM 09/02/2023    9:27 AM  GAD 7 : Generalized Anxiety Score  Nervous, Anxious, on Edge 0 0 0 0  Control/stop worrying 0 0 0 0  Worry too much - different things 0 0 0 0  Trouble relaxing 0 0 0 0  Restless 0 0 0 0  Easily annoyed or irritable 0 0 0 0  Afraid - awful might happen 0 0 0 0  Total GAD 7 Score 0 0 0 0  Anxiety Difficulty Not difficult at all Not difficult at all Not difficult at all Not difficult at all     5. Primary insomnia I son ambien  to sleep and is not able to sleep without meds  6. 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 His last oncology visit was in 2019. He came in for cough several weeks ago and we did chest xray which show no signs of cancer.  7. Severe obesity  (BMI >= 40) (HCC) No recent weight changes  Wt Readings from Last 3 Encounters:  08/27/24 300 lb (136.1 kg)  08/13/24 (!) 301 lb (136.5 kg)  07/13/24 (!) 301 lb (136.5 kg)   BMI Readings from Last 3 Encounters:  08/27/24 43.05 kg/m  08/13/24 43.19 kg/m  07/13/24 43.19 kg/m       New complaints: None today  Allergies  Allergen Reactions   Penicillins Hives    Has patient had a PCN reaction causing immediate rash, facial/tongue/throat swelling, SOB or lightheadedness with hypotension: No Has patient had a PCN reaction causing severe rash involving mucus membranes or skin necrosis: Yes Has patient had a PCN reaction that required hospitalization No Has patient had a PCN reaction occurring within the last 10 years: No If all of the above answers are NO, then may proceed with Cephalosporin use.    Simvastatin     REACTION: myalgia   Outpatient Encounter Medications as of 08/27/2024  Medication Sig   albuterol  (VENTOLIN  HFA) 108 (90 Base) MCG/ACT inhaler Inhale 2 puffs into the lungs every 6 (six) hours as needed for wheezing or shortness of breath.   aspirin 81 MG  tablet Take 81 mg by mouth at bedtime.   atorvastatin  (LIPITOR) 40 MG tablet Take 1 tablet (40 mg total) by mouth daily at 6 PM.   azithromycin  (ZITHROMAX  Z-PAK) 250 MG tablet As directed   benzonatate  (TESSALON  PERLES) 100 MG capsule Take 1 capsule (100 mg total) by mouth 3 (three) times daily as needed for cough.   carvedilol  (COREG ) 3.125 MG tablet Take 1 tablet (3.125 mg total) by mouth 2 (two) times daily with a meal.   cetirizine  (ZYRTEC ) 10 MG tablet TAKE ONE TABLET DAILY   clonazePAM  (KLONOPIN ) 0.5 MG tablet Take 1 tablet (0.5 mg total) by mouth 2 (two) times daily.   cyclobenzaprine  (FLEXERIL ) 10 MG tablet Take 1 tablet (10 mg total) by mouth 3 (three) times daily as needed for muscle spasms.   DULoxetine  (CYMBALTA ) 60 MG capsule Take 1 capsule (60 mg total) by mouth daily.   fluticasone  (FLONASE ) 50  MCG/ACT nasal spray Place 2 sprays into both nostrils daily.   losartan  (COZAAR ) 25 MG tablet Take 1 tablet (25 mg total) by mouth daily.   Multiple Vitamins-Minerals (CENTRUM SILVER ULTRA MENS) TABS Take 1 tablet by mouth daily.   Olopatadine HCl (PATADAY OP) Apply to eye.   Omega-3 Fatty Acids (FISH OIL) 1000 MG CAPS Take 1,000 mg by mouth 2 (two) times daily.   Propylene Glycol-Glycerin 0.6-0.6 % SOLN Apply 1 drop to eye daily as needed (dry eyes).   traZODone  (DESYREL ) 50 MG tablet Take 1 tablet (50 mg total) by mouth at bedtime.   zolpidem  (AMBIEN ) 5 MG tablet Take 1 tablet (5 mg total) by mouth at bedtime as needed. for sleep   No facility-administered encounter medications on file as of 08/27/2024.    Past Surgical History:  Procedure Laterality Date   BACK SURGERY  07/2008   tumor removed off spine   heart sack removed  07/2009   HEMORRHOID SURGERY     LEFT HEART CATH  10/10   NASAL SINUS SURGERY     PORT-A-CATH REMOVAL Left 12/13/2016   Procedure: MINOR REMOVAL PORT-A-CATH;  Surgeon: Oneil Budge, MD;  Location: [facility];  Service: General;  Laterality: Left;   PORTACATH PLACEMENT     TUMOR REMOVAL      Family History  Problem Relation Age of Onset   Parkinson's disease Mother    Turner syndrome Sister        questionable   Colon cancer Neg Hx       Controlled substance contract: n/a     Review of Systems  Constitutional:  Negative for diaphoresis.  Eyes:  Negative for pain.  Respiratory:  Negative for choking and shortness of breath.   Cardiovascular:  Negative for chest pain, palpitations and leg swelling.  Gastrointestinal:  Negative for abdominal pain.  Endocrine: Negative for polydipsia.  Skin:  Negative for rash.  Neurological:  Negative for dizziness, weakness and headaches.  Hematological:  Does not bruise/bleed easily.  All other systems reviewed and are negative.      Objective:   Physical Exam Vitals and nursing note reviewed.  Constitutional:       Appearance: Normal appearance. He is well-developed.  HENT:     Head: Normocephalic.     Nose: Nose normal.     Mouth/Throat:     Mouth: Mucous membranes are moist.     Pharynx: Oropharynx is clear.  Eyes:     Pupils: Pupils are equal, round, and reactive to light.  Neck:     Thyroid: No  thyroid mass or thyromegaly.     Vascular: No carotid bruit or JVD.     Trachea: Phonation normal.  Cardiovascular:     Rate and Rhythm: Normal rate and regular rhythm.  Pulmonary:     Effort: Pulmonary effort is normal. No respiratory distress.     Breath sounds: Normal breath sounds.  Abdominal:     General: Bowel sounds are normal.     Palpations: Abdomen is soft.     Tenderness: There is no abdominal tenderness.  Musculoskeletal:        General: Normal range of motion.     Cervical back: Normal range of motion and neck supple.     Right lower leg: Edema (1+) present.     Left lower leg: Edema (1+) present.  Lymphadenopathy:     Cervical: No cervical adenopathy.  Skin:    General: Skin is warm and dry.  Neurological:     Mental Status: He is alert and oriented to person, place, and time.  Psychiatric:        Behavior: Behavior normal.        Thought Content: Thought content normal.        Judgment: Judgment normal.     BP 134/88   Pulse 66   Temp (!) 97.5 F (36.4 C) (Temporal)   Ht 5' 10 (1.778 m)   Wt 300 lb (136.1 kg)   SpO2 93%   BMI 43.05 kg/m     HGBAc 6.0%     Assessment & Plan:  REMINGTON HIGHBAUGH comes in today with chief complaint of medical management of chronic issues    Diagnosis and orders addressed:  1.hypertension assoc with diabetes Low sodium diet - CBC with Differential/Platelet - CMP14+EGFR - carvedilol  (COREG ) 3.125 MG tablet; Take 1 tablet (3.125 mg total) by mouth 2 (two) times daily with a meal.  Dispense: 180 tablet; Refill: 1 - losartan  (COZAAR ) 25 MG tablet; Take 1 tablet (25 mg total) by mouth daily.  Dispense: 90 tablet; Refill: 1  2.  Hyperlipidemia with target LDL less than 100 Low fat diet - Lipid panel - atorvastatin  (LIPITOR) 40 MG tablet; Take 1 tablet (40 mg total) by mouth daily at 6 PM.  Dispense: 90 tablet; Refill: 1  3. Chronic congestive heart failure, unspecified heart failure type (HCC) Needs to follow up with cardiology  4. GAD (generalized anxiety disorder) Stress management - clonazePAM  (KLONOPIN ) 0.5 MG tablet; Take 1 tablet (0.5 mg total) by mouth 2 (two) times daily.  Dispense: 60 tablet; Refill: 5 - DULoxetine  (CYMBALTA ) 60 MG capsule; Take 1 capsule (60 mg total) by mouth daily.  Dispense: 90 capsule; Refill: 1  5. Primary insomnia Bedtime routine - traZODone  (DESYREL ) 50 MG tablet; Take 1 tablet (50 mg total) by mouth at bedtime.  Dispense: 90 tablet; Refill: 1 - zolpidem  (AMBIEN ) 10 MG tablet; Take 1 tablet (10 mg total) by mouth at bedtime as needed. for sleep  Dispense: 30 tablet; Refill: 5  6. 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 Severe obesity (BMI >= 40) (HCC) Discussed diet and exercise for person with BMI >25 Will recheck weight in 3-6 months   8. Elevated blood sugar Watch carbs in diet  9. Diabetes mellitus with assoc complications (HCC) - Bayer DCA Hb A1c Waived - Microalbumin / creatinine urine ratio   Labs pending Health Maintenance reviewed Diet and exercise encouraged  Follow up plan: 6 months   Mary-Margaret Gladis, FNP

## 2024-08-27 NOTE — Patient Instructions (Signed)
 Insomnia Insomnia is a sleep disorder that makes it difficult to fall asleep or stay asleep. Insomnia can cause fatigue, low energy, difficulty concentrating, mood swings, and poor performance at work or school. There are three different ways to classify insomnia: Difficulty falling asleep. Difficulty staying asleep. Waking up too early in the morning. Any type of insomnia can be long-term (chronic) or short-term (acute). Both are common. Short-term insomnia usually lasts for 3 months or less. Chronic insomnia occurs at least three times a week for longer than 3 months. What are the causes? Insomnia may be caused by another condition, situation, or substance, such as: Having certain mental health conditions, such as anxiety and depression. Using caffeine, alcohol , tobacco, or drugs. Having gastrointestinal conditions, such as gastroesophageal reflux disease (GERD). Having certain medical conditions. These include: Asthma. Alzheimer's disease. Stroke. Chronic pain. An overactive thyroid  gland (hyperthyroidism). Other sleep disorders, such as restless legs syndrome and sleep apnea. Menopause. Sometimes, the cause of insomnia may not be known. What increases the risk? Risk factors for insomnia include: Gender. Females are affected more often than males. Age. Insomnia is more common as people get older. Stress and certain medical and mental health conditions. Lack of exercise. Having an irregular work schedule. This may include working night shifts and traveling between different time zones. What are the signs or symptoms? If you have insomnia, the main symptom is having trouble falling asleep or having trouble staying asleep. This may lead to other symptoms, such as: Feeling tired or having low energy. Feeling nervous about going to sleep. Not feeling rested in the morning. Having trouble concentrating. Feeling irritable, anxious, or depressed. How is this diagnosed? This condition  may be diagnosed based on: Your symptoms and medical history. Your health care provider may ask about: Your sleep habits. Any medical conditions you have. Your mental health. A physical exam. How is this treated? Treatment for insomnia depends on the cause. Treatment may focus on treating an underlying condition that is causing the insomnia. Treatment may also include: Medicines to help you sleep. Counseling or therapy. Lifestyle adjustments to help you sleep better. Follow these instructions at home: Eating and drinking  Limit or avoid alcohol , caffeinated beverages, and products that contain nicotine and tobacco, especially close to bedtime. These can disrupt your sleep. Do not eat a large meal or eat spicy foods right before bedtime. This can lead to digestive discomfort that can make it hard for you to sleep. Sleep habits  Keep a sleep diary to help you and your health care provider figure out what could be causing your insomnia. Write down: When you sleep. When you wake up during the night. How well you sleep and how rested you feel the next day. Any side effects of medicines you are taking. What you eat and drink. Make your bedroom a dark, comfortable place where it is easy to fall asleep. Put up shades or blackout curtains to block light from outside. Use a white noise machine to block noise. Keep the temperature cool. Limit screen use before bedtime. This includes: Not watching TV. Not using your smartphone, tablet, or computer. Stick to a routine that includes going to bed and waking up at the same times every day and night. This can help you fall asleep faster. Consider making a quiet activity, such as reading, part of your nighttime routine. Try to avoid taking naps during the day so that you sleep better at night. Get out of bed if you are still awake after  15 minutes of trying to sleep. Keep the lights down, but try reading or doing a quiet activity. When you feel  sleepy, go back to bed. General instructions Take over-the-counter and prescription medicines only as told by your health care provider. Exercise regularly as told by your health care provider. However, avoid exercising in the hours right before bedtime. Use relaxation techniques to manage stress. Ask your health care provider to suggest some techniques that may work well for you. These may include: Breathing exercises. Routines to release muscle tension. Visualizing peaceful scenes. Make sure that you drive carefully. Do not drive if you feel very sleepy. Keep all follow-up visits. This is important. Contact a health care provider if: You are tired throughout the day. You have trouble in your daily routine due to sleepiness. You continue to have sleep problems, or your sleep problems get worse. Get help right away if: You have thoughts about hurting yourself or someone else. Get help right away if you feel like you may hurt yourself or others, or have thoughts about taking your own life. Go to your nearest emergency room or: Call 911. Call the National Suicide Prevention Lifeline at 2232757840 or 988. This is open 24 hours a day. Text the Crisis Text Line at 657-529-4371. Summary Insomnia is a sleep disorder that makes it difficult to fall asleep or stay asleep. Insomnia can be long-term (chronic) or short-term (acute). Treatment for insomnia depends on the cause. Treatment may focus on treating an underlying condition that is causing the insomnia. Keep a sleep diary to help you and your health care provider figure out what could be causing your insomnia. This information is not intended to replace advice given to you by your health care provider. Make sure you discuss any questions you have with your health care provider. Document Revised: 09/17/2021 Document Reviewed: 09/17/2021 Elsevier Patient Education  2024 [employer].

## 2024-08-28 LAB — CBC WITH DIFFERENTIAL/PLATELET
Basophils Absolute: 0 x10E3/uL (ref 0.0–0.2)
Basos: 0 %
EOS (ABSOLUTE): 0.3 x10E3/uL (ref 0.0–0.4)
Eos: 4 %
Hematocrit: 44.5 % (ref 37.5–51.0)
Hemoglobin: 14.2 g/dL (ref 13.0–17.7)
Immature Grans (Abs): 0 x10E3/uL (ref 0.0–0.1)
Immature Granulocytes: 0 %
Lymphocytes Absolute: 1.1 x10E3/uL (ref 0.7–3.1)
Lymphs: 16 %
MCH: 31.4 pg (ref 26.6–33.0)
MCHC: 31.9 g/dL (ref 31.5–35.7)
MCV: 99 fL — ABNORMAL HIGH (ref 79–97)
Monocytes Absolute: 0.5 x10E3/uL (ref 0.1–0.9)
Monocytes: 7 %
Neutrophils Absolute: 5.1 x10E3/uL (ref 1.4–7.0)
Neutrophils: 73 %
Platelets: 185 x10E3/uL (ref 150–450)
RBC: 4.52 x10E6/uL (ref 4.14–5.80)
RDW: 11.6 % (ref 11.6–15.4)
WBC: 6.9 x10E3/uL (ref 3.4–10.8)

## 2024-08-28 LAB — CMP14+EGFR
ALT: 66 IU/L — ABNORMAL HIGH (ref 0–44)
AST: 55 IU/L — ABNORMAL HIGH (ref 0–40)
Albumin: 4 g/dL (ref 3.9–4.9)
Alkaline Phosphatase: 80 IU/L (ref 47–123)
BUN/Creatinine Ratio: 12 (ref 10–24)
BUN: 11 mg/dL (ref 8–27)
Bilirubin Total: 0.9 mg/dL (ref 0.0–1.2)
CO2: 29 mmol/L (ref 20–29)
Calcium: 9.6 mg/dL (ref 8.6–10.2)
Chloride: 99 mmol/L (ref 96–106)
Creatinine, Ser: 0.91 mg/dL (ref 0.76–1.27)
Globulin, Total: 2.8 g/dL (ref 1.5–4.5)
Glucose: 154 mg/dL — ABNORMAL HIGH (ref 70–99)
Potassium: 4.4 mmol/L (ref 3.5–5.2)
Sodium: 139 mmol/L (ref 134–144)
Total Protein: 6.8 g/dL (ref 6.0–8.5)
eGFR: 92 mL/min/1.73 (ref >59)

## 2024-08-28 LAB — LIPID PANEL
Chol/HDL Ratio: 3.6 ratio (ref 0.0–5.0)
Cholesterol, Total: 154 mg/dL (ref 100–199)
HDL: 43 mg/dL (ref >39)
LDL Chol Calc (NIH): 68 mg/dL (ref 0–99)
Triglycerides: 266 mg/dL — ABNORMAL HIGH (ref 0–149)
VLDL Cholesterol Cal: 43 mg/dL — ABNORMAL HIGH (ref 5–40)

## 2024-08-30 ENCOUNTER — Ambulatory Visit: Payer: Self-pay | Admitting: Nurse Practitioner

## 2024-09-08 ENCOUNTER — Other Ambulatory Visit: Payer: Self-pay | Admitting: Nurse Practitioner

## 2024-09-08 DIAGNOSIS — F5101 Primary insomnia: Secondary | ICD-10-CM

## 2025-01-17 ENCOUNTER — Ambulatory Visit: Payer: Self-pay

## 2025-02-21 ENCOUNTER — Ambulatory Visit: Admitting: Nurse Practitioner
